# Patient Record
Sex: Male | Born: 1950 | Race: White | Hispanic: No | Marital: Married | Smoking: Never smoker
Health system: Southern US, Community
[De-identification: ages and names within clinical notes are randomized; demographics above are authoritative.]

## PROBLEM LIST (undated history)

## (undated) DIAGNOSIS — K7460 Unspecified cirrhosis of liver: Secondary | ICD-10-CM

## (undated) DIAGNOSIS — R251 Tremor, unspecified: Secondary | ICD-10-CM

## (undated) DIAGNOSIS — K219 Gastro-esophageal reflux disease without esophagitis: Secondary | ICD-10-CM

## (undated) DIAGNOSIS — I499 Cardiac arrhythmia, unspecified: Secondary | ICD-10-CM

## (undated) DIAGNOSIS — Z974 Presence of external hearing-aid: Secondary | ICD-10-CM

## (undated) DIAGNOSIS — H9190 Unspecified hearing loss, unspecified ear: Secondary | ICD-10-CM

## (undated) DIAGNOSIS — K760 Fatty (change of) liver, not elsewhere classified: Secondary | ICD-10-CM

## (undated) DIAGNOSIS — E119 Type 2 diabetes mellitus without complications: Secondary | ICD-10-CM

## (undated) DIAGNOSIS — H409 Unspecified glaucoma: Secondary | ICD-10-CM

## (undated) DIAGNOSIS — K5790 Diverticulosis of intestine, part unspecified, without perforation or abscess without bleeding: Secondary | ICD-10-CM

## (undated) DIAGNOSIS — G4730 Sleep apnea, unspecified: Secondary | ICD-10-CM

## (undated) DIAGNOSIS — R609 Edema, unspecified: Secondary | ICD-10-CM

## (undated) DIAGNOSIS — Z8639 Personal history of other endocrine, nutritional and metabolic disease: Secondary | ICD-10-CM

## (undated) DIAGNOSIS — J449 Chronic obstructive pulmonary disease, unspecified: Secondary | ICD-10-CM

## (undated) DIAGNOSIS — I10 Essential (primary) hypertension: Secondary | ICD-10-CM

## (undated) DIAGNOSIS — C801 Malignant (primary) neoplasm, unspecified: Secondary | ICD-10-CM

## (undated) DIAGNOSIS — R918 Other nonspecific abnormal finding of lung field: Secondary | ICD-10-CM

## (undated) DIAGNOSIS — I2699 Other pulmonary embolism without acute cor pulmonale: Secondary | ICD-10-CM

## (undated) DIAGNOSIS — F329 Major depressive disorder, single episode, unspecified: Secondary | ICD-10-CM

## (undated) DIAGNOSIS — F32A Depression, unspecified: Secondary | ICD-10-CM

## (undated) DIAGNOSIS — Z87442 Personal history of urinary calculi: Secondary | ICD-10-CM

## (undated) DIAGNOSIS — R011 Cardiac murmur, unspecified: Secondary | ICD-10-CM

## (undated) DIAGNOSIS — M109 Gout, unspecified: Secondary | ICD-10-CM

## (undated) DIAGNOSIS — I4891 Unspecified atrial fibrillation: Secondary | ICD-10-CM

## (undated) DIAGNOSIS — IMO0001 Reserved for inherently not codable concepts without codable children: Secondary | ICD-10-CM

## (undated) DIAGNOSIS — J189 Pneumonia, unspecified organism: Secondary | ICD-10-CM

## (undated) DIAGNOSIS — L719 Rosacea, unspecified: Secondary | ICD-10-CM

## (undated) DIAGNOSIS — C4491 Basal cell carcinoma of skin, unspecified: Secondary | ICD-10-CM

## (undated) HISTORY — DX: Essential (primary) hypertension: I10

## (undated) HISTORY — PX: OTHER SURGICAL HISTORY: SHX169

## (undated) HISTORY — PX: CARDIAC ELECTROPHYSIOLOGY MAPPING AND ABLATION: SHX1292

## (undated) HISTORY — PX: JOINT REPLACEMENT: SHX530

## (undated) HISTORY — DX: Reserved for inherently not codable concepts without codable children: IMO0001

## (undated) HISTORY — PX: LIVER BIOPSY: SHX301

## (undated) HISTORY — DX: Depression, unspecified: F32.A

## (undated) HISTORY — PX: TOE AMPUTATION: SHX809

## (undated) HISTORY — DX: Major depressive disorder, single episode, unspecified: F32.9

## (undated) HISTORY — PX: BRONCHOSCOPY: SUR163

## (undated) HISTORY — PX: ANTERIOR CRUCIATE LIGAMENT (ACL) REVISION: SHX6707

## (undated) HISTORY — PX: MENISCUS REPAIR: SHX5179

## (undated) HISTORY — PX: COLONOSCOPY: SHX174

## (undated) HISTORY — PX: KIDNEY STONE SURGERY: SHX686

## (undated) HISTORY — DX: Gastro-esophageal reflux disease without esophagitis: K21.9

## (undated) HISTORY — PX: CHOLECYSTECTOMY: SHX55

## (undated) HISTORY — PX: BUNIONECTOMY: SHX129

## (undated) HISTORY — PX: EYE SURGERY: SHX253

---

## 2003-10-13 ENCOUNTER — Emergency Department (HOSPITAL_COMMUNITY): Admission: AD | Admit: 2003-10-13 | Discharge: 2003-10-13 | Payer: Self-pay | Admitting: Family Medicine

## 2003-10-23 ENCOUNTER — Ambulatory Visit (HOSPITAL_BASED_OUTPATIENT_CLINIC_OR_DEPARTMENT_OTHER): Admission: RE | Admit: 2003-10-23 | Discharge: 2003-10-23 | Payer: Self-pay | Admitting: *Deleted

## 2005-03-07 ENCOUNTER — Ambulatory Visit (HOSPITAL_COMMUNITY): Admission: RE | Admit: 2005-03-07 | Discharge: 2005-03-07 | Payer: Self-pay | Admitting: Internal Medicine

## 2007-08-11 ENCOUNTER — Ambulatory Visit: Payer: Self-pay | Admitting: Gastroenterology

## 2007-08-14 ENCOUNTER — Observation Stay: Payer: Self-pay | Admitting: Gastroenterology

## 2008-08-09 ENCOUNTER — Ambulatory Visit: Payer: Self-pay | Admitting: Internal Medicine

## 2009-03-30 ENCOUNTER — Ambulatory Visit: Payer: Self-pay | Admitting: Unknown Physician Specialty

## 2009-04-17 ENCOUNTER — Ambulatory Visit: Payer: Self-pay | Admitting: Unknown Physician Specialty

## 2010-10-17 ENCOUNTER — Ambulatory Visit: Payer: Self-pay | Admitting: Gastroenterology

## 2012-09-17 ENCOUNTER — Ambulatory Visit: Payer: Self-pay | Admitting: General Practice

## 2012-10-07 ENCOUNTER — Ambulatory Visit: Payer: Self-pay | Admitting: General Practice

## 2012-10-15 ENCOUNTER — Ambulatory Visit: Payer: Self-pay | Admitting: General Practice

## 2012-10-29 ENCOUNTER — Ambulatory Visit: Payer: Self-pay | Admitting: General Practice

## 2013-10-16 LAB — COMPREHENSIVE METABOLIC PANEL
ALBUMIN: 4.1 g/dL (ref 3.4–5.0)
AST: 72 U/L — AB (ref 15–37)
Alkaline Phosphatase: 120 U/L — ABNORMAL HIGH
Anion Gap: 4 — ABNORMAL LOW (ref 7–16)
BILIRUBIN TOTAL: 0.7 mg/dL (ref 0.2–1.0)
BUN: 25 mg/dL — ABNORMAL HIGH (ref 7–18)
CHLORIDE: 103 mmol/L (ref 98–107)
CO2: 29 mmol/L (ref 21–32)
Calcium, Total: 9.2 mg/dL (ref 8.5–10.1)
Creatinine: 1.19 mg/dL (ref 0.60–1.30)
EGFR (African American): >60
GLUCOSE: 133 mg/dL — AB (ref 65–99)
OSMOLALITY: 278 (ref 275–301)
Potassium: 4.3 mmol/L (ref 3.5–5.1)
SGPT (ALT): 94 U/L — ABNORMAL HIGH (ref 12–78)
Sodium: 136 mmol/L (ref 136–145)
TOTAL PROTEIN: 7.8 g/dL (ref 6.4–8.2)

## 2013-10-16 LAB — CBC WITH DIFFERENTIAL/PLATELET
Basophil #: 0.1 10*3/uL (ref 0.0–0.1)
Basophil %: 1.2 %
EOS ABS: 0.2 10*3/uL (ref 0.0–0.7)
EOS PCT: 1.8 %
HCT: 50.3 % (ref 40.0–52.0)
HGB: 16.8 g/dL (ref 13.0–18.0)
LYMPHS ABS: 2.8 10*3/uL (ref 1.0–3.6)
LYMPHS PCT: 31.3 %
MCH: 32.3 pg (ref 26.0–34.0)
MCHC: 33.5 g/dL (ref 32.0–36.0)
MCV: 97 fL (ref 80–100)
MONO ABS: 0.9 x10 3/mm (ref 0.2–1.0)
MONOS PCT: 10.4 %
NEUTROS ABS: 4.9 10*3/uL (ref 1.4–6.5)
Neutrophil %: 55.3 %
PLATELETS: 187 10*3/uL (ref 150–440)
RBC: 5.2 10*6/uL (ref 4.40–5.90)
RDW: 13.4 % (ref 11.5–14.5)
WBC: 8.8 10*3/uL (ref 3.8–10.6)

## 2013-10-16 LAB — LIPASE, BLOOD: Lipase: 133 U/L (ref 73–393)

## 2013-10-17 ENCOUNTER — Observation Stay: Payer: Self-pay | Admitting: Internal Medicine

## 2013-10-17 LAB — URINALYSIS, COMPLETE
Bacteria: NONE SEEN
Bilirubin,UR: NEGATIVE
GLUCOSE, UR: NEGATIVE mg/dL (ref 0–75)
Ketone: NEGATIVE
LEUKOCYTE ESTERASE: NEGATIVE
Nitrite: NEGATIVE
PROTEIN: NEGATIVE
Ph: 5 (ref 4.5–8.0)
SPECIFIC GRAVITY: 1.033 (ref 1.003–1.030)
Squamous Epithelial: <1

## 2013-10-18 LAB — BASIC METABOLIC PANEL
ANION GAP: 4 — AB (ref 7–16)
BUN: 19 mg/dL — ABNORMAL HIGH (ref 7–18)
CHLORIDE: 101 mmol/L (ref 98–107)
Calcium, Total: 8.3 mg/dL — ABNORMAL LOW (ref 8.5–10.1)
Co2: 30 mmol/L (ref 21–32)
Creatinine: 1.02 mg/dL (ref 0.60–1.30)
EGFR (Non-African Amer.): >60
Glucose: 77 mg/dL (ref 65–99)
Osmolality: 271 (ref 275–301)
Potassium: 3.7 mmol/L (ref 3.5–5.1)
SODIUM: 135 mmol/L — AB (ref 136–145)

## 2013-10-18 LAB — CBC WITH DIFFERENTIAL/PLATELET
BASOS ABS: 0 10*3/uL (ref 0.0–0.1)
Basophil %: 0.9 %
Eosinophil #: 0.1 10*3/uL (ref 0.0–0.7)
Eosinophil %: 2.3 %
HCT: 45.1 % (ref 40.0–52.0)
HGB: 15.2 g/dL (ref 13.0–18.0)
Lymphocyte #: 2.4 10*3/uL (ref 1.0–3.6)
Lymphocyte %: 43.6 %
MCH: 32.9 pg (ref 26.0–34.0)
MCHC: 33.7 g/dL (ref 32.0–36.0)
MCV: 98 fL (ref 80–100)
Monocyte #: 0.6 x10 3/mm (ref 0.2–1.0)
Monocyte %: 10.7 %
Neutrophil #: 2.4 10*3/uL (ref 1.4–6.5)
Neutrophil %: 42.5 %
PLATELETS: 132 10*3/uL — AB (ref 150–440)
RBC: 4.63 10*6/uL (ref 4.40–5.90)
RDW: 13.6 % (ref 11.5–14.5)
WBC: 5.6 10*3/uL (ref 3.8–10.6)

## 2013-11-23 DIAGNOSIS — N1330 Unspecified hydronephrosis: Secondary | ICD-10-CM | POA: Insufficient documentation

## 2013-11-23 DIAGNOSIS — N201 Calculus of ureter: Secondary | ICD-10-CM | POA: Insufficient documentation

## 2013-11-23 DIAGNOSIS — N23 Unspecified renal colic: Secondary | ICD-10-CM | POA: Insufficient documentation

## 2013-11-23 DIAGNOSIS — N200 Calculus of kidney: Secondary | ICD-10-CM | POA: Insufficient documentation

## 2013-12-04 ENCOUNTER — Emergency Department: Payer: Self-pay | Admitting: Emergency Medicine

## 2013-12-04 LAB — CBC
HCT: 49 % (ref 40.0–52.0)
HGB: 16.4 g/dL (ref 13.0–18.0)
MCH: 31.9 pg (ref 26.0–34.0)
MCHC: 33.5 g/dL (ref 32.0–36.0)
MCV: 95 fL (ref 80–100)
Platelet: 158 10*3/uL (ref 150–440)
RBC: 5.15 10*6/uL (ref 4.40–5.90)
RDW: 13.6 % (ref 11.5–14.5)
WBC: 9.2 10*3/uL (ref 3.8–10.6)

## 2013-12-04 LAB — COMPREHENSIVE METABOLIC PANEL
ALK PHOS: 87 U/L
Albumin: 3.7 g/dL (ref 3.4–5.0)
Anion Gap: 9 (ref 7–16)
BILIRUBIN TOTAL: 2 mg/dL — AB (ref 0.2–1.0)
BUN: 17 mg/dL (ref 7–18)
CALCIUM: 9.1 mg/dL (ref 8.5–10.1)
CO2: 25 mmol/L (ref 21–32)
Chloride: 98 mmol/L (ref 98–107)
Creatinine: 0.84 mg/dL (ref 0.60–1.30)
GLUCOSE: 115 mg/dL — AB (ref 65–99)
Osmolality: 267 (ref 275–301)
POTASSIUM: 3.8 mmol/L (ref 3.5–5.1)
SGOT(AST): 56 U/L — ABNORMAL HIGH (ref 15–37)
SGPT (ALT): 78 U/L (ref 12–78)
Sodium: 132 mmol/L — ABNORMAL LOW (ref 136–145)
Total Protein: 7.9 g/dL (ref 6.4–8.2)

## 2014-01-02 DIAGNOSIS — F329 Major depressive disorder, single episode, unspecified: Secondary | ICD-10-CM | POA: Insufficient documentation

## 2014-01-02 DIAGNOSIS — F32A Depression, unspecified: Secondary | ICD-10-CM | POA: Insufficient documentation

## 2014-01-02 DIAGNOSIS — I10 Essential (primary) hypertension: Secondary | ICD-10-CM | POA: Insufficient documentation

## 2014-01-02 DIAGNOSIS — M109 Gout, unspecified: Secondary | ICD-10-CM | POA: Insufficient documentation

## 2014-01-02 DIAGNOSIS — K219 Gastro-esophageal reflux disease without esophagitis: Secondary | ICD-10-CM | POA: Insufficient documentation

## 2014-01-02 DIAGNOSIS — G4733 Obstructive sleep apnea (adult) (pediatric): Secondary | ICD-10-CM | POA: Insufficient documentation

## 2014-01-02 DIAGNOSIS — J449 Chronic obstructive pulmonary disease, unspecified: Secondary | ICD-10-CM | POA: Insufficient documentation

## 2014-01-02 DIAGNOSIS — R259 Unspecified abnormal involuntary movements: Secondary | ICD-10-CM | POA: Insufficient documentation

## 2014-01-02 DIAGNOSIS — L719 Rosacea, unspecified: Secondary | ICD-10-CM | POA: Insufficient documentation

## 2014-01-02 DIAGNOSIS — R251 Tremor, unspecified: Secondary | ICD-10-CM | POA: Insufficient documentation

## 2014-05-08 ENCOUNTER — Ambulatory Visit (INDEPENDENT_AMBULATORY_CARE_PROVIDER_SITE_OTHER): Payer: Federal, State, Local not specified - PPO | Admitting: Podiatry

## 2014-05-08 ENCOUNTER — Encounter: Payer: Self-pay | Admitting: Podiatry

## 2014-05-08 ENCOUNTER — Ambulatory Visit (INDEPENDENT_AMBULATORY_CARE_PROVIDER_SITE_OTHER): Payer: Federal, State, Local not specified - PPO

## 2014-05-08 VITALS — BP 104/60 | HR 64 | Resp 16 | Ht 76.0 in | Wt 240.0 lb

## 2014-05-08 DIAGNOSIS — M2040 Other hammer toe(s) (acquired), unspecified foot: Secondary | ICD-10-CM

## 2014-05-08 NOTE — Progress Notes (Signed)
He presents today to discuss surgical intervention regarding his left foot. 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 He denies fever chills nausea vomiting no changes in his past medical history medications allergies surgeries social history.  Objective: Pulses are strongly palpable bilateral. He has severe hammertoe deformity #2 of the left foot which is severely rigid and malformed. An elongated second metatarsal on radiograph also demonstrates plantar flexed second metatarsal. 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 He has pain on palpation both of these toes with no signs of infection.  Assessment: Severe hammertoe deformity second metatarsophalangeal joint and second toe left foot. 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  Plan: Discussed etiology pathology conservative versus surgical therapies. I did suggest to him that we could try to decompress the metatarsophalangeal joint and to lower the toe and pin the toe at this point he is more in favor of simple disarticulation of the second metatarsophalangeal joint. A mallet toe repair third digit left foot. We will followup with him in January for surgical consideration.

## 2014-06-06 DIAGNOSIS — E785 Hyperlipidemia, unspecified: Secondary | ICD-10-CM | POA: Insufficient documentation

## 2014-06-07 DIAGNOSIS — L84 Corns and callosities: Secondary | ICD-10-CM

## 2014-06-07 DIAGNOSIS — N529 Male erectile dysfunction, unspecified: Secondary | ICD-10-CM | POA: Insufficient documentation

## 2014-07-31 ENCOUNTER — Ambulatory Visit: Payer: Federal, State, Local not specified - PPO | Admitting: Podiatry

## 2014-08-03 ENCOUNTER — Ambulatory Visit: Payer: Federal, State, Local not specified - PPO | Admitting: Podiatry

## 2014-08-07 ENCOUNTER — Ambulatory Visit (INDEPENDENT_AMBULATORY_CARE_PROVIDER_SITE_OTHER): Payer: Federal, State, Local not specified - PPO | Admitting: Podiatry

## 2014-08-07 VITALS — BP 96/54 | HR 86 | Resp 16

## 2014-08-07 DIAGNOSIS — M2040 Other hammer toe(s) (acquired), unspecified foot: Secondary | ICD-10-CM

## 2014-08-07 NOTE — Progress Notes (Signed)
He presents today for surgical consult with his wife regarding his painful hammertoes second digit bilateral. He also has a painful arthritic third toe left foot.  Objective: Vital signs are stable he is alert and oriented 3 no changes from previous exam which demonstrates severe multiplanar deformity of the second metatarsal phalangeal joint and second toe bilaterally. He also has severe osteoarthritic changes to the DIPJ of the third digit of the left foot. Complete dislocation of the second metatarsophalangeal joint left foot with severe contracture deformity left over right. Radiographs were reviewed today confirming severe digital deformities.  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  Plan: We discussed the pros and cons of decompression of the second metatarsal head and fusion of the PIPJ and DIPJ of the second toes bilaterally. After discussing this quite thoroughly with both he and his wife he requests that the toe simply be disarticulated and removed. He would like to third toe of the left foot surgically repaired at the DIPJ.  We went over a consent form today line by line number by number giving him ample time to ask questions both he and his wife saw fit regarding a amputation of the second toe at the metatarsophalangeal joint bilaterally. I answered all the questions regarding these procedures to the best of my abilities. An arthroplasty of the DIPJ will be performed to the third toe left foot. We discussed the possible postop complications the benefits and the risks of this type of surgery he understands and is amenable to it and I will follow-up with him in a few weeks for surgery.

## 2014-08-07 NOTE — Patient Instructions (Signed)
Pre-Operative Instructions  Congratulations, you have decided to take an important step to improving your quality of life.  You can be assured that the doctors of [facility] will be with you every step of the way.  1. Plan to be at the surgery center/hospital at least 1 (one) hour prior to your scheduled time unless otherwise directed by the surgical center/hospital staff.  You must have a responsible adult accompany you, remain during the surgery and drive you home.  Make sure you have directions to the surgical center/hospital and know how to get there on time. 2. For hospital based surgery you will need to obtain a history and physical form from your family physician within 1 month prior to the date of surgery- we will give you a form for you primary physician.  3. We make every effort to accommodate the date you request for surgery.  There are however, times where surgery dates or times have to be moved.  We will contact you as soon as possible if a change in schedule is required.   4. No Aspirin/Ibuprofen for one week before surgery.  If you are on aspirin, any non-steroidal anti-inflammatory medications (Mobic, Aleve, Ibuprofen) you should stop taking it 7 days prior to your surgery.  You make take Tylenol  For pain prior to surgery.  5. Medications- If you are taking daily heart and blood pressure medications, seizure, reflux, allergy, asthma, anxiety, pain or diabetes medications, make sure the surgery center/hospital is aware before the day of surgery so they may notify you which medications to take or avoid the day of surgery. 6. No food or drink after midnight the night before surgery unless directed otherwise by surgical center/hospital staff. 7. No alcoholic beverages 24 hours prior to surgery.  No smoking 24 hours prior to or 24 hours after surgery. 8. Wear loose pants or shorts- loose enough to fit over bandages, boots, and casts. 9. No slip on shoes, sneakers are best. 10. Bring  your boot with you to the surgery center/hospital.  Also bring crutches or a walker if your physician has prescribed it for you.  If you do not have this equipment, it will be provided for you after surgery. 11. If you have not been contracted by the surgery center/hospital by the day before your surgery, call to confirm the date and time of your surgery. 12. Leave-time from work may vary depending on the type of surgery you have.  Appropriate arrangements should be made prior to surgery with your employer. 13. Prescriptions will be provided immediately following surgery by your doctor.  Have these filled as soon as possible after surgery and take the medication as directed. 14. Remove nail polish on the operative foot. 15. Wash the night before surgery.  The night before surgery wash the foot and leg well with the antibacterial soap provided and water paying special attention to beneath the toenails and in between the toes.  Rinse thoroughly with water and dry well with a towel.  Perform this wash unless told not to do so by your physician.  Enclosed: 1 Ice pack (please put in freezer the night before surgery)   1 Hibiclens skin cleaner   Pre-op Instructions  If you have any questions regarding the instructions, do not hesitate to call our office.  [place]: [address] [place], [place] [address] [phone]  [place]: [address] [place], [place] [address] [phone]  [place]: [address]  ,  [address] [phone]  Dr. Richard   Tuchman DPM, Dr. Norman Regal DPM Dr. Richard Sikora DPM, Dr. M. Todd Hyatt DPM, Dr. Kathryn Egerton DPM 

## 2014-08-08 ENCOUNTER — Telehealth: Payer: Self-pay | Admitting: *Deleted

## 2014-08-08 NOTE — Telephone Encounter (Signed)
CPAP is usually not necessary after surgery.

## 2014-08-08 NOTE — Telephone Encounter (Signed)
Pt states he saw Dr. Milinda Pointer yesterday, and is scheduled for B/L foot surgery 1 week from Friday.  Pt states he would like to inform Dr. Milinda Pointer, that he uses a CPAP at night, and has had several surgeries since receiving the CPAP without any problems.  Pt states he will inform the surgical center when they call.

## 2014-08-11 ENCOUNTER — Telehealth: Payer: Self-pay | Admitting: *Deleted

## 2014-08-11 NOTE — Telephone Encounter (Signed)
Pt asked if he should stay on the Aspirin 81mg .  I told him to continue the except the day of and resume the day after, he may have a little more bleeding than someone not on Aspirin.

## 2014-08-18 ENCOUNTER — Encounter: Payer: Self-pay | Admitting: Podiatry

## 2014-08-18 DIAGNOSIS — M779 Enthesopathy, unspecified: Secondary | ICD-10-CM

## 2014-08-18 DIAGNOSIS — M2040 Other hammer toe(s) (acquired), unspecified foot: Secondary | ICD-10-CM

## 2014-08-18 DIAGNOSIS — M13879 Other specified arthritis, unspecified ankle and foot: Secondary | ICD-10-CM

## 2014-08-21 ENCOUNTER — Telehealth: Payer: Self-pay

## 2014-08-21 NOTE — Telephone Encounter (Signed)
Pt called stating that he had surgery on his feet Friday and has concerns regarding the antibiotic (Keflex) prescribed, states he has past liver issues and was concerned about taking the medication

## 2014-08-21 NOTE — Telephone Encounter (Signed)
Spoke to [place] told him not to be concerned about taking the antibiotic that he will be fine with that, he also stated that he was itching an hour after taking medication, told him that was probably the pain medication not the antibiotic. He stated that he is stopping the pain medication , that he is not experiencing a lot of pain that he would be fine without it. Told him to call us back with any problems

## 2014-08-23 ENCOUNTER — Ambulatory Visit (INDEPENDENT_AMBULATORY_CARE_PROVIDER_SITE_OTHER): Payer: Federal, State, Local not specified - PPO

## 2014-08-23 ENCOUNTER — Ambulatory Visit (INDEPENDENT_AMBULATORY_CARE_PROVIDER_SITE_OTHER): Payer: Federal, State, Local not specified - PPO | Admitting: Podiatry

## 2014-08-23 VITALS — BP 91/55 | HR 58 | Resp 99

## 2014-08-23 DIAGNOSIS — M2041 Other hammer toe(s) (acquired), right foot: Secondary | ICD-10-CM

## 2014-08-23 DIAGNOSIS — Z9889 Other specified postprocedural states: Secondary | ICD-10-CM

## 2014-08-23 DIAGNOSIS — M2042 Other hammer toe(s) (acquired), left foot: Secondary | ICD-10-CM

## 2014-08-23 NOTE — Progress Notes (Signed)
He presents today for follow-up 5 days postop amputation disarticulation second digits bilateral at the metatarsophalangeal joint and a DIPJ arthroplasty third digit left foot. He denies fever chills nausea vomiting muscle aches and pains.  Objective: Vital signs are stable he is alert and oriented 3. Dry sterile dressing was intact was removed demonstrates surgical sites appear to be doing very well mild erythema but appears to be induration and bruising more than infection left foot. Mild drainage from the suture sites third toe left foot.  Assessment: Well-healing surgical foot status post amputation second metatarsal phalangeal joints and DIPJ arthroplasty third left.  Plan: Redressed today dressed compressive dressing follow-up with him and we can a half for suture removal.

## 2014-08-24 NOTE — Progress Notes (Signed)
DOS 08/18/2014 B/L 2nd toe amputation, left 3rd DIPJ arthroplasty performed by DR. Hyatt.

## 2014-09-04 ENCOUNTER — Ambulatory Visit (INDEPENDENT_AMBULATORY_CARE_PROVIDER_SITE_OTHER): Payer: Federal, State, Local not specified - PPO | Admitting: Podiatry

## 2014-09-04 ENCOUNTER — Ambulatory Visit (INDEPENDENT_AMBULATORY_CARE_PROVIDER_SITE_OTHER): Payer: Federal, State, Local not specified - PPO

## 2014-09-04 ENCOUNTER — Encounter: Payer: Self-pay | Admitting: Podiatry

## 2014-09-04 VITALS — BP 128/82 | HR 64 | Resp 12

## 2014-09-04 DIAGNOSIS — Z9889 Other specified postprocedural states: Secondary | ICD-10-CM

## 2014-09-04 DIAGNOSIS — M2040 Other hammer toe(s) (acquired), unspecified foot: Secondary | ICD-10-CM

## 2014-09-04 NOTE — Patient Instructions (Signed)

## 2014-09-04 NOTE — Progress Notes (Signed)
Karma presents today 3 weeks status post amputation second digit bilateral and arthroplasty third digit left foot. He denies fever chills nausea vomiting muscle aches and pains.  Objective: Vital signs are stable he is alert and oriented 3. Pulses are palpable sutures are intact margins are well coapted with the exception of the distalmost margin of the left foot at the amputation site. I removed the sutures from the third toe as well as the proximal and distal knots on each foot at the amputation site. I see no signs of infection but I would like to have this to heal completely before removing these sutures.  Assessment: Well-healing surgical foot bilateral.  Plan: Removed a portion of this sutures today and instructed him on how to wrap the third digit of the left foot. I'm going to allow him to start soaking this twice daily in Epsom salts and warm water wrap the toe daily and I will follow-up with him in 1 week.

## 2014-09-11 ENCOUNTER — Encounter: Payer: Federal, State, Local not specified - PPO | Admitting: Podiatry

## 2014-09-12 ENCOUNTER — Encounter: Payer: Federal, State, Local not specified - PPO | Admitting: Podiatry

## 2014-09-12 ENCOUNTER — Ambulatory Visit (INDEPENDENT_AMBULATORY_CARE_PROVIDER_SITE_OTHER): Payer: Federal, State, Local not specified - PPO | Admitting: Podiatry

## 2014-09-12 VITALS — BP 110/63 | HR 75 | Resp 16

## 2014-09-12 DIAGNOSIS — Z9889 Other specified postprocedural states: Secondary | ICD-10-CM

## 2014-09-12 MED ORDER — SULFAMETHOXAZOLE-TRIMETHOPRIM 800-160 MG PO TABS: ORAL_TABLET | ORAL | Status: DC

## 2014-09-12 NOTE — Progress Notes (Signed)
He presents today for suture removal status post amputation disarticulation second digit bilateral. He also had a DIPJ arthroplasty third digit left foot. He states that the left foot does not appear to be healing as well at the amputation site.  Objective: Vital signs are stable he is alert and oriented 3 he has a mild dehiscence at the surgical site. The sutures were removed today and there appeared to be a small abscess noted. I cleaned up all of the necrotic tissue and debrided the area nicely today to bleeding. Placed a Silvadene dressing dry sterile compressive dressing. He will start soaking in Epsom salts and warm water. I wrote a prescription for Bactrim.  Assessment: Well-healing surgical foot right mild dehiscence of surgical site left foot. Hammertoe is healing well left third.  Plan: Start him on an antibiotic today Bactrim DS for the dehiscence and the mild erythema to the dorsal aspect of his left foot. He will continue to soak and apply a dressing daily and I will follow-up with him in 2 weeks. His right foot he will get back into a regular tennis shoe.

## 2014-09-13 ENCOUNTER — Encounter: Payer: Self-pay | Admitting: Podiatry

## 2014-09-27 ENCOUNTER — Ambulatory Visit (INDEPENDENT_AMBULATORY_CARE_PROVIDER_SITE_OTHER): Payer: Federal, State, Local not specified - PPO | Admitting: Podiatry

## 2014-09-27 ENCOUNTER — Encounter: Payer: Self-pay | Admitting: Podiatry

## 2014-09-27 ENCOUNTER — Ambulatory Visit (INDEPENDENT_AMBULATORY_CARE_PROVIDER_SITE_OTHER): Payer: Federal, State, Local not specified - PPO

## 2014-09-27 DIAGNOSIS — M2040 Other hammer toe(s) (acquired), unspecified foot: Secondary | ICD-10-CM

## 2014-09-27 MED ORDER — SULFAMETHOXAZOLE-TRIMETHOPRIM 800-160 MG PO TABS: ORAL_TABLET | ORAL | Status: DC

## 2014-09-27 NOTE — Progress Notes (Signed)
He presents today status post amputation disarticulation second toes bilateral. Superficial cellulitis to the left foot has gone on to resolve after antibiotic therapy. However there are 2 very superficial wounds at the surgical sites. He denies fever chills nausea vomiting muscle aches and pains states that the wounds are getting better.  Objective: Vital signs are stable he is alert and oriented 3. Pulses are strongly palpable bilateral. There is minimal edema and no erythema cellulitis drainage or odor.  Assessment: Well-healing surgical sites bilateral.  Plan: Continue his antibiotic until the wounds are completely closed. Continue to soak warm water and Epsom salts. he may wear his regular shoe gear.

## 2014-10-11 ENCOUNTER — Ambulatory Visit (INDEPENDENT_AMBULATORY_CARE_PROVIDER_SITE_OTHER): Payer: Federal, State, Local not specified - PPO

## 2014-10-11 ENCOUNTER — Ambulatory Visit (INDEPENDENT_AMBULATORY_CARE_PROVIDER_SITE_OTHER): Payer: Federal, State, Local not specified - PPO | Admitting: Podiatry

## 2014-10-11 VITALS — BP 97/67 | HR 66 | Resp 16

## 2014-10-11 DIAGNOSIS — Z9889 Other specified postprocedural states: Secondary | ICD-10-CM

## 2014-10-11 DIAGNOSIS — S90122A Contusion of left lesser toe(s) without damage to nail, initial encounter: Secondary | ICD-10-CM

## 2014-10-11 NOTE — Progress Notes (Signed)
He presents today for follow-up of slow healing wounds status post amputation second metatarsal phalangeal joint bilateral he also states that he injured his toes while walking through the house in the bedroom at night. He states that he kicked his left foot or with performing arthroplasty on the DIPJ third digit left.  Objective: Vital signs are stable he is alert and oriented 3. Pulses are strongly palpable. Wounds appear to be 90% resolved no drainage no signs of infection. The third digit of the left foot does demonstrate edema and tenderness on palpation. Radiograph of the left foot does demonstrate a complete dislocation of the DIPJ from where it was immediately postoperatively. This is most likely secondary to his recent trauma.  Assessment: Dislocation DIPJ third digit left. Slowly healing wounds associated with diabetes status post amputation toes #2 at the second metatarsophalangeal joint bilateral. Follow up with him in 1 month. Continue to wrap the toe on a daily basis.

## 2014-11-08 ENCOUNTER — Ambulatory Visit (INDEPENDENT_AMBULATORY_CARE_PROVIDER_SITE_OTHER): Payer: Federal, State, Local not specified - PPO | Admitting: Podiatry

## 2014-11-08 ENCOUNTER — Encounter: Payer: Self-pay | Admitting: Podiatry

## 2014-11-08 ENCOUNTER — Ambulatory Visit: Payer: Self-pay

## 2014-11-08 VITALS — BP 96/54 | HR 70 | Resp 16

## 2014-11-08 DIAGNOSIS — Z9889 Other specified postprocedural states: Secondary | ICD-10-CM

## 2014-11-08 NOTE — Progress Notes (Signed)
He presents today for his final follow-up visit status post disarticulation second toes bilateral foot. He appears to be doing quite well he states that he is back to walking 2-1/2 miles a day.  Objective: Vital signs are stable he is alert and oriented 3 incision sites gone on to heal 100%. He has no pain on palpation of the surgical site.  Assessment: Well-healing surgical foot bilateral.  Plan: Continue to wrap third toe for the next couple to 3 weeks and follow up with me as needed.

## 2014-11-17 NOTE — Op Note (Signed)
PATIENT NAME:  Billy Freeman, Billy Freeman MR#:  [id number] DATE OF BIRTH:  1950-12-25  DATE OF PROCEDURE:  10/15/2012  PREOPERATIVE DIAGNOSIS: Internal derangement of the right knee.   POSTOPERATIVE DIAGNOSES:  1. Tear of the posterior horn of medial meniscus, right knee.  2. Tear of the anterior and posterior horns of the lateral meniscus, right knee.  3. 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   PROCEDURES PERFORMED: Right knee arthroscopy, partial medial and lateral meniscectomies and tricompartmental chondroplasty.   SURGEON: Laurice Record. Holley Bouche., MD  ANESTHESIA: General.   ESTIMATED BLOOD LOSS: Minimal.   FLUIDS REPLACED: 600 mL of crystalloid.   DRAINS: None.   INDICATIONS FOR SURGERY: The patient is a 64 year old gentleman who has been seen for complaints of right knee pain. MRI demonstrated findings consistent with meniscal pathology as well as some degenerative change. After discussion of the risks and benefits of surgical intervention, the patient expressed understanding of the risks and benefits and agreed with plans for surgical intervention.   PROCEDURE IN DETAIL: The patient was brought in the operating room, and after adequate general anesthesia was achieved, a tourniquet was placed on the patient's upper right thigh. The patient's right knee and leg were cleaned and prepped with alcohol and DuraPrep and draped in the usual sterile fashion. A "timeout" was performed as per usual protocol. The anticipated portal sites were injected with 0.25% Marcaine with epinephrine. An anterolateral portal was created and cannula was inserted. A small effusion was evacuated. The scope was inserted, and the knee was distended with fluid using the Stryker pump. The scope was advanced down the medial gutter into the medial compartment of the knee. Under visualization with the scope, an anteromedial portal  was created, and a hook probe was inserted. Inspection of the medial compartment demonstrated degenerative tear of the posterior horn of the medial meniscus. This was debrided using a combination of 4.5 mm incisor shaver, with subsequent debridement and contouring performed using the 50 degree ArthroCare wand. The remaining rim of the meniscus was visualized and probed and felt to stable. Anterior horn was visualized and probed and felt to be stable. Mild chondral changes were noted to the medial femoral condyle. There was an area of grade 4 chondromalacia involving the posterior and medial aspect of the medial tibial plateau. The margins were debrided and contoured using the 50 degree ArthroCare wand. The scope was then advanced into the intercondylar region. The anterior cruciate ligament was visualized and probed and felt to be stable.   The scope was removed from the anterolateral portal and reinserted via the anteromedial portal so as to better visualize the lateral compartment. The articular surface to the lateral femoral condyle was in reasonably good condition. Grade 3 changes of chondromalacia to the lateral tibial plateau. Debridement was performed using the 50 degree ArthroCare wand. There was a complex degenerative tear involving both the anterior and posterior horns of the lateral meniscus. The areas of the tears were debrided using a 4.5 mm shaver and subsequently a 50 degree ArthroCare wand. The remaining rim of the meniscus was visualized and probed and felt to be stable. Finally, the scope was positioned so as to visualize the patellofemoral articulation. Good patellar tracking was noted. Some mild grade 2 to 3 changes of chondromalacia were noted to the medial facet, and these were debrided and contoured using the ArthroCare wand.   The knee was irrigated with copious  amounts of fluid and then suctioned dry. The anterolateral portal was reapproximated using #3-0 nylon. A combination of 0.25%  Marcaine with epinephrine and 4 mg of morphine was injected via the scope. The scope was removed, and the anteromedial portal was reapproximated using #3-0 nylon. A sterile dressing was applied, followed by application of ice wrap.   The patient tolerated the procedure well. He was transported to the recovery room in stable condition.   ____________________________ Laurice Record. Holley Bouche., MD jph:OSi D: 10/15/2012 19:43:11 ET T: 10/16/2012 11:47:21 ET JOB#: [id number]  cc: Jeneen Rinks P. Holley Bouche., MD, <Dictator> JAMES P Holley Bouche MD ELECTRONICALLY SIGNED 10/16/2012 20:31

## 2014-11-18 NOTE — Consult Note (Signed)
Stone removed with minimal ureteral edema.does well, can be discharged early afternoon.up 1-2 weeks after return from vactation Rx for pain meds and antibiotic on chart.  Electronic Signatures: Murrell Redden (MD) (Signed on 24-Mar-15 10:50)  Authored   Last Updated: 24-Mar-15 10:55 by Murrell Redden (MD)

## 2014-11-18 NOTE — H&P (Signed)
PATIENT NAME:  Billy Freeman, Billy Freeman MR#:  [id number] DATE OF BIRTH:  06/08/1951  DATE OF ADMISSION:  10/17/2013  REFERRING PHYSICIAN: Dr. Lurline Hare.   PRIMARY CARE PHYSICIAN: Dr. Geralyn Corwin.     CHIEF COMPLAINT:  Left renal colic.   HISTORY OF PRESENT ILLNESS: This is a 64 year old male with known past medical history of hypertension, sleep apnea on CPAP, hyperlipidemia, GERD, depression diet-controlled diabetes mellitus. The patient presents with complaints of left flank pain and abdominal pain.  The patient reported his symptoms had presented a few hours prior to presentation. He denies any history of renal colic or kidney stones in the past. Reports it is sharp, stabbing pain radiating to the groin area, started in the left flank area. The patient required multiple pain medications during his stay in the ED, so hospitalist services were requested to admit the patient. The patient denies any fever or chills, was afebrile. In the ED, denies any vomiting, but had some episodes of nausea. No coffee-ground emesis. No bright red blood per rectum. No diarrhea. No sweating. No testicular pain. The patient reports no relieving or provoking factors. CT abdomen and pelvis did show evidence of moderate left hydronephrosis to the level of the ureterovesicular junction where a 10 x 4 mm calculus in seen. The CT abdomen and pelvis did show evidence of cirrhosis of the liver, as well as blood work did show mildly elevated LFTs.   PAST MEDICAL HISTORY: 1.  Hypertension.  2.  Sleep apnea on CPAP.  3.  Hyperlipidemia.  4.  GERD. 5.  Depression.  6.  Insomnia.  7.  History of basal cell carcinoma.  8.  Benign essential tremors.  9.  History of fatty liver.  10.  Gout.  11.  History of rosacea on erythromycin.    PAST SURGICAL HISTORY: 1.  Bilateral bunion surgeries.  2.  Status post multiple knee surgeries, arthroscopies.  3.  Liver biopsies in 2006 and 2010.  4.  History of skin cancer, excised on nose.  5.   Colonoscopy.  6.  Right knee meniscus surgery.   ALLERGIES: ANIMAL DANDRUFF.   HOME MEDICATIONS: 1.  Aspirin 81 mg daily.  2.  Propranolol 20 mg oral 2 times a day.  3.  Citalopram 20 mg oral daily.  4.  Hydrochlorothiazide/losartan 12.5/100 mg oral daily.  5.  Temazepam 15 mg at bedtime as needed.  6.  Erythromycin 250 mg oral daily in the morning. This is for rosacea.    7.  Fish oil 1200 mg oral 2 times a day.  8.  Bupropion 150 mg 2 times daily.   SOCIAL HISTORY: The patient lives with his wife. No smoking. No alcohol. No illicit drug use.   FAMILY HISTORY: No coronary artery disease at young age.   REVIEW OF SYSTEMS:  CONSTITUTIONAL:  The patient denies any fever, any chills, any fatigue, weakness, weight gain or weight loss.  EYES: Denies blurry vision, double vision or inflammation.  ENT: Denies tinnitus, ear pain, hearing loss, epistaxis.  RESPIRATORY: Denies cough, wheezing, hemoptysis.  CARDIOVASCULAR: Denies chest pain, edema, arrhythmia, palpitation, syncope.  GASTROINTESTINAL:  Reports nausea. Denies any vomiting, any diarrhea, any hematemesis, any melena any jaundice and rectal bleed. Reports some abdominal pain.  GENITOURINARY: Denies dysuria, hematuria. Reports left side renal colic.  ENDOCRINE: Denies polyuria, polydipsia, heat or cold intolerance.  HEMATOLOGY: Denies anemia, easy bruising or bleeding diathesis.  INTEGUMENT: Denies acne, rash or skin lesion.  MUSCULOSKELETAL: Denies any swelling, arthritis or cramps. He  reports history of gout.  NEUROLOGIC: Denies any history of CVA, TIA, headache, ataxia or vertigo.  PSYCHIATRIC: Denies any anxiety or schizophrenia.  Reports history of insomnia and depression.   PHYSICAL EXAMINATION: VITAL SIGNS: Temperature 98.3, pulse 69, respiratory rate 18, blood pressure 155/95, saturating 95% on room air.  GENERAL: Well-nourished male, looks comfortable in bed, in no apparent distress.  HEENT: Head atraumatic,  normocephalic. Pupils equal, reactive to light. Pink conjunctivae. Anicteric sclerae. Moist oral mucosa.  NECK: Supple. No thyromegaly. No JVD.  CHEST: Good air entry bilaterally. No wheezing, rales, rhonchi.  CARDIOVASCULAR: S1, S2 heard. No rubs, murmurs or gallops.  ABDOMEN: Soft, nontender, nondistended. Bowel sounds present.  EXTREMITIES: No edema. No clubbing. No cyanosis. Pedal and radial pulses +2 bilaterally.  PSYCHIATRIC: Appropriate affect. Awake, alert x 3. Intact judgment and insight.  NEUROLOGIC: Cranial nerves grossly intact. Motor 5/5. No focal deficits.  SKIN: Normal skin turgor. Warm and dry.  LYMPHATIC: No cervical lymphadenopathy could be appreciated.   PERTINENT LABORATORIES: Glucose 133, BUN 25, creatinine 1.19, sodium 136, potassium 4.3, chloride 103, CO2 of 29. ALT 94, AST 72, alk phos 120. White blood cell 8.8, hemoglobin 16.8, hematocrit 50.3, platelets 187. Urinalysis negative for leuk esterase and nitrites, showing 4 white blood cells, 81 red blood cells. Lactic acid 2.2.   IMAGING STUDIES: CT abdomen and pelvis with contrast showing moderate left hydronephrosis to the level of the ureterovesicular junction where a 10 x 4 mm calculus is seen. Mildly delayed left nephrogram concerning for early dysfunction.   ASSESSMENT AND PLAN: 1.  Left renal colic, nephrolithiasis with evidence of mild hydronephrosis on the CT scan.  The patient will be admitted for pain control. Will be kept on p.r.n. nausea and pain medicine. Will be started on IV fluids, 100 mL per hour. We will consult urology service if there is any need for intervention at this point. We will start him empirically on IV Rocephin.  2. Hypertension: Blood pressure is acceptable. Continue on home medication; hydrochlorothiazide and losartan.  3.  Obstructive sleep apnea. Continue with CPAP at home.  4.  Hyperlipidemia. Continue with fish oil.  5.  Gastroesophageal reflux disease.  Continue with PPI.  6.  History  of depression. Continue with home medications; Celexa and bupropion.  7.  Elevated LFTs. The patient is known to have history of fatty liver in the past with evidence of possible cirrhosis on his CT abdomen. We will avoid nephrotoxic medications. This can be followed as an outpatient by his primary care physician.  8.  Deep vein thrombosis prophylaxis with subQ heparin.   CODE STATUS: The patient is FULL CODE.   TOTAL TIME SPENT ON ADMISSION AND PATIENT CARE: 55 minutes    ____________________________ Albertine Patricia, MD dse:dmm D: 10/17/2013 03:04:21 ET T: 10/17/2013 10:07:43 ET JOB#: [id number]  cc: Albertine Patricia, MD, <Dictator> Oliver Heitzenrater Graciela Husbands MD ELECTRONICALLY SIGNED 10/17/2013 23:32

## 2014-11-18 NOTE — Op Note (Signed)
PATIENT NAME:  Billy Freeman, Billy Freeman MR#:  [id number] DATE OF BIRTH:  1951-05-26  DATE OF PROCEDURE:  10/18/2013  PRINCIPAL DIAGNOSES:  1. Left ureterolithiasis.  2. Hydronephrosis.  3. Renal colic.   POSTOPERATIVE DIAGNOSIS: 1. Left ureterolithiasis.  2. Hydronephrosis.  3. Renal colic.   PROCEDURE: Left ureteroscopy with holmium with lithotripsy.   SURGEON: Edrick Oh, M.D.   ANESTHESIA: Laryngeal mask airway anesthesia.   INDICATIONS: The patient is a 64 year old gentleman with no prior history of nephrolithiasis. He presented with sudden onset left-sided flank pain. He was found to have an approximately 9 mm distal left ureteral calculus with obstruction. He had difficulty with pain control. He was admitted for IV hydration and pain control. He is scheduled to leave on an extended vacation in the near future. The decision was made to proceed with ureteroscopic stone removal due to ongoing pain and discomfort.   DESCRIPTION OF PROCEDURE: After informed consent was obtained, the patient was taken to the Operating Room and placed in the dorsal lithotomy position under laryngeal mask airway anesthesia. The patient was then prepped and draped in the usual standard fashion. The 54 French rigid cystoscope was introduced into the urethra under direct vision with no urethral abnormalities noted. Upon entering the prostate, moderate bilobar hypertrophy was noted with only partial visual obstruction. A very small median lobe component was appreciated upon entering the bladder. The mucosa was inspected in its entirety with no gross mucosal lesions noted. Prominent hypervascularity was noted along the trigone region and around the ureteral orifices, both cortices were easily identified. A guidewire was advanced into the left ureteral orifice. Mild resistance was met. With some manipulation the guidewire was able to be advanced into the upper pole collecting system under fluoroscopic guidance. The cystoscope was  then removed. The 6 French rigid ureteroscope was advanced into the urinary bladder. The scope was easily advanced into the left ureteral orifice with no resistance met. Approximately 1.5 cm into the lower ureter the stone was encountered. It was noted to be very crystalline in nature. The holmium laser fiber was then utilized to fragment the stone into multiple smaller pieces. Once the pieces were fragmented they were basket extracted. Utilizing a 0 tip 4-prong Nitinol basket after the bulk of the fragments were removed. The scope was advanced to the level of the ureteropelvic junction. Moderate dilation of the proximal ureter above the level of the stone was noted. No other appreciable abnormalities were noted. Minimal stone fragments remained. The area of stone impaction demonstrated minimal edema. The decision was made not to place a stent. The ureteroscope was removed. The cystoscope was replaced back into the urinary bladder. The bladder was irrigated of the fragments. These were collected and will be sent for stone analysis. The bladder was then drained. The cystoscope was removed. An 67 French red rubber catheter was then inserted into the urinary bladder. 40 mL of 2%  lidocaine was instilled into the urinary bladder for topical anesthesia. He was then returned to the supine position. He was awakened from laryngeal mask airway anesthesia and taken to the recovery room in stable condition. There were no problems or complications. The patient tolerated the procedure well.    ____________________________ Denice Bors. Jacqlyn Larsen, MD bsc:sg D: 10/18/2013 11:12:00 ET T: 10/18/2013 11:38:08 ET JOB#: [id number] Billy Wojciak [name] MD ELECTRONICALLY SIGNED 10/28/2013 8:14

## 2014-11-18 NOTE — Consult Note (Signed)
Requesting physician: Dr. Ginette Pitman Consulting physician: Dr. Bernardo Heater  Impression/ Recommendation: distal ureteral calculus-this stone is large and unlikely to pass.  If his pain is controlled a brief trial of passage was discussed.  Shockwave lithotripsy and ureteroscopic removal were also discussed.  Since he is scheduled to leave for [place] at the end of the week he desires the most expeditious treatment and will schedule ureteroscopy with laser lithotripsy.  My next available OR time is on 3/25.  Dr. Jacqlyn Larsen does have available time tomorrow and has agreed to perform the surgery with which the patient has agreed.procedure was discussed including the potential risks of bleeding, infection, ureteral injury and ureteral stricture.  He was informed that for a stone in this location the success rate is high however there is no guarantee that the stone will be able to be removed.  The potential need for a ureteral stent postoperatively was also discussed.  He indicated all questions were answered to his satisfaction and desires to proceed.   Reason for consultation:1.  Left ureteral calculus  History of present illness:   64 year old white male who was awake and on the morning of admission with severe left flank pain radiating to the left lower quadrant and left groin region.  The pain was rated 10/10 without identifiable precipitating, aggravating or alleviating factors.  He had nausea without vomiting.  Denied fever, chills or gross hematuria.  There were no significant lower urinary tract symptoms. He was evaluated in the emergency department and a CT of the abdomen pelvis showed a 4 x 10 mm left ureterovesical junction stone and he was admitted by the hospitalist service.  He denies previous history of urologic problems or stone disease.  His pain is presently minimal.  He is scheduled to leave on 3/28 for a 2 week trip to [place].  Past medical history: 1.  Hypertension.  2.  Sleep apnea on CPAP.  3.   Hyperlipidemia.  4.  GERD. 5.  Depression.  6.  Insomnia.  7.  History of basal cell carcinoma.  8.  Benign essential tremors.  9.  History of fatty liver.  10.  Gout.  11.  History of rosacea on erythromycin.    Past surgical history: 1.  Bilateral bunion surgeries.  2.  Status post multiple knee surgeries, arthroscopies.  3.  Liver biopsies in 2006 and 2010.  4.  History of skin cancer, excised on nose.  5.  Colonoscopy.  6.  Right knee meniscus surgery.  Medications: 1.  Aspirin 81 mg daily.  2.  Propranolol 20 mg oral 2 times a day.  3.  Citalopram 20 mg oral daily.  4.  Hydrochlorothiazide/losartan 12.5/100 mg oral daily.  5.  Temazepam 15 mg at bedtime as needed.  6.  Erythromycin 250 mg oral daily in the morning. This is for rosacea.    7.  Fish oil 1200 mg oral 2 times a day.  8.  Bupropion 150 mg 2 times daily.   Allergies:NKDA  Social history: No tobacco or alcohol use  Review of systems: Otherwise noncontributory except as per the history of present illness  Exam:  General-well developed, well-nourished male in no acute distress HEENT: Oral cavity clear Abd: Soft nontender without masses  Data: Urinalysis with microhematuria and no significant pyuria.  Creatinine 1.19.  CT was reviewed.  Electronic Signatures: Abbie Sons (MD)  (Signed on 23-Mar-15 19:15)  Authored  Last Updated: 23-Mar-15 19:15 by Abbie Sons (MD)

## 2015-02-26 DIAGNOSIS — Z87442 Personal history of urinary calculi: Secondary | ICD-10-CM | POA: Insufficient documentation

## 2015-10-02 DIAGNOSIS — R82994 Hypercalciuria: Secondary | ICD-10-CM | POA: Insufficient documentation

## 2016-01-24 ENCOUNTER — Telehealth: Payer: Self-pay | Admitting: Podiatry

## 2016-07-09 ENCOUNTER — Encounter: Payer: Self-pay | Admitting: *Deleted

## 2016-07-09 ENCOUNTER — Emergency Department
Admission: EM | Admit: 2016-07-09 | Discharge: 2016-07-09 | Disposition: A | Discharge status: Home / Self Care | Payer: Medicare Other | Attending: Emergency Medicine | Admitting: Emergency Medicine

## 2016-07-09 ENCOUNTER — Emergency Department: Payer: Medicare Other

## 2016-07-09 DIAGNOSIS — Z79899 Other long term (current) drug therapy: Secondary | ICD-10-CM | POA: Insufficient documentation

## 2016-07-09 DIAGNOSIS — Y999 Unspecified external cause status: Secondary | ICD-10-CM | POA: Diagnosis not present

## 2016-07-09 DIAGNOSIS — S32010A Wedge compression fracture of first lumbar vertebra, initial encounter for closed fracture: Secondary | ICD-10-CM | POA: Diagnosis not present

## 2016-07-09 DIAGNOSIS — Y929 Unspecified place or not applicable: Secondary | ICD-10-CM | POA: Insufficient documentation

## 2016-07-09 DIAGNOSIS — Y939 Activity, unspecified: Secondary | ICD-10-CM | POA: Diagnosis not present

## 2016-07-09 DIAGNOSIS — S3992XA Unspecified injury of lower back, initial encounter: Secondary | ICD-10-CM | POA: Diagnosis present

## 2016-07-09 DIAGNOSIS — W19XXXA Unspecified fall, initial encounter: Secondary | ICD-10-CM

## 2016-07-09 DIAGNOSIS — Z7982 Long term (current) use of aspirin: Secondary | ICD-10-CM | POA: Diagnosis not present

## 2016-07-09 DIAGNOSIS — J449 Chronic obstructive pulmonary disease, unspecified: Secondary | ICD-10-CM | POA: Insufficient documentation

## 2016-07-09 DIAGNOSIS — I10 Essential (primary) hypertension: Secondary | ICD-10-CM | POA: Diagnosis not present

## 2016-07-09 DIAGNOSIS — W1789XA Other fall from one level to another, initial encounter: Secondary | ICD-10-CM | POA: Diagnosis not present

## 2016-07-09 MED ORDER — OXYCODONE HCL 5 MG PO TABS: 5.0000 mg | ORAL_TABLET | Freq: Three times a day (TID) | ORAL | 0 refills | Status: DC | PRN

## 2016-07-09 MED ORDER — OXYCODONE-ACETAMINOPHEN 5-325 MG PO TABS: 1.0000 | ORAL_TABLET | Freq: Four times a day (QID) | ORAL | 0 refills | Status: DC | PRN

## 2016-07-09 NOTE — ED Notes (Signed)
Patient transported to CT 

## 2016-07-09 NOTE — Discharge Instructions (Signed)
Please call the number to follow-up with orthopedics in the next 1-2 weeks. Please take your prescribed pain medication as needed for back discomfort, as written. Please return to the emergency department for any acutely worsening pain, any weakness or numbness of either leg, or for any urinary or bowel incontinence (loss of control of your bladder or bowels).

## 2016-07-09 NOTE — ED Provider Notes (Signed)
[facility] Emergency Department Provider Note  Time seen: 8:12 PM  I have reviewed the triage vital signs and the nursing notes.   HISTORY  Chief Complaint Fall and Back Pain    HPI Billy Freeman is a 65 y.o. male with a past medical history of hypertension, presents to the emergency department after falling approximately 8 feet from a deer stand onto the ground. According to the patient he lost his balance on a deer stand falling out of it. The deer stent was approximately 8 feet off the ground. Patient states he landed on his feet, fell to his buttocks and rolled backwards. Thinks he hit his head but denies headache or loss of consciousness. Patient was able to walk approximately three quarters of a mild back to his car and drive himself home. Patient states he took an oxycodone tablet he had left over from an old surgery which reduced the pain from a 9/10, to currently a 2/10. Patient states pain in his lower back only. Denies any chest pain or trouble breathing. Denies neck pain or upper back pain. Denies headache. Denies any focal weakness or numbness. Patient was able to ambulate although he states with lower back pain.  Past Medical History:  Diagnosis Date  . Depression   . Hypertension   . Reflux     Patient Active Problem List   Diagnosis Date Noted  . CAFL (chronic airflow limitation) ([place]) 01/02/2014  . Clinical depression 01/02/2014  . Acid reflux 01/02/2014  . Gout 01/02/2014  . BP (high blood pressure) 01/02/2014  . Intermittent tremor 01/02/2014  . Obstructive apnea 01/02/2014  . Acne erythematosa 01/02/2014  . Calculus of kidney 11/23/2013  . Hydronephrosis 11/23/2013  . Renal colic 99991111  . Calculi, ureter 11/23/2013    No past surgical history on file.  Prior to Admission medications   Medication Sig Start Date End Date Taking? Authorizing Provider  aspirin EC 81 MG tablet Take by mouth.    Historical Provider, MD   azithromycin (ZITHROMAX) 250 MG tablet Take by mouth daily.    Historical Provider, MD  citalopram (CELEXA) 20 MG tablet  03/28/14   Historical Provider, MD  losartan-hydrochlorothiazide (HYZAAR) 50-12.5 MG per tablet Take 1 tablet by mouth daily.    Historical Provider, MD  Omega-3 Fatty Acids (FISH OIL) 1000 MG CAPS Take by mouth.    Historical Provider, MD  propranolol (INDERAL) 20 MG tablet  04/16/14   Historical Provider, MD  temazepam (RESTORIL) 15 MG capsule  03/19/14   Historical Provider, MD  Vitamin D, Ergocalciferol, (DRISDOL) 50000 UNITS CAPS capsule TAKE 1 CAPSULE BY MOUTH ONCE A WEEK 03/06/14   Historical Provider, MD    No Known Allergies  No family history on file.  Social History Social History  Substance Use Topics  . Smoking status: Never Smoker  . Smokeless tobacco: Never Used  . Alcohol use No    Review of Systems Constitutional: Negative for fever. Cardiovascular: Negative for chest pain. Respiratory: Negative for shortness of breath. Gastrointestinal: Negative for abdominal pain Musculoskeletal: Positive for lower back pain. Neurological: Negative for headaches, focal weakness or numbness. 10-point ROS otherwise negative.  ____________________________________________   PHYSICAL EXAM:  VITAL SIGNS: ED Triage Vitals  Enc Vitals Group     BP 07/09/16 1956 125/68     Pulse Rate 07/09/16 1956 72     Resp 07/09/16 1956 20     Temp 07/09/16 1956 97.8 F (36.6 C)     Temp Source  07/09/16 1956 Oral     SpO2 07/09/16 1956 93 %     Weight 07/09/16 1959 245 lb (111.1 kg)     Height 07/09/16 1959 6\' 4"  (1.93 m)     Head Circumference --      Peak Flow --      Pain Score 07/09/16 1959 5     Pain Loc --      Pain Edu? --      Excl. in [place]? --     Constitutional: Alert and oriented. Well appearing and in no distress. Eyes: Normal exam ENT   Head: Normocephalic and atraumatic.No C-spine tenderness.   Mouth/Throat: Mucous membranes are  moist. Cardiovascular: Normal rate, regular rhythm. No murmur Respiratory: Normal respiratory effort without tachypnea nor retractions. Breath sounds are clear  Gastrointestinal: Soft and nontender. No distention.   Musculoskeletal: Nontender with normal range of motion in all extremities. Good range of motion in arms and legs. No C-spine or T-spine tenderness. Mild lumbar spine tenderness. No ecchymosis, no deformity. Neurologic:  Normal speech and language. No gross focal neurologic deficits. Neurovascular intact. Sensation is intact and equal in bilateral lower extremities. Skin:  Skin is warm, dry and intact.  Psychiatric: Mood and affect are normal.   ____________________________________________     RADIOLOGY  CT shows L1 endplate compression fracture. Chest x-ray negative. Pelvis x-ray negative. Lumbar x-ray shows possible fracture. ____________________________________________   INITIAL IMPRESSION / ASSESSMENT AND PLAN / ED COURSE  Pertinent labs & imaging results that were available during my care of the patient were reviewed by me and considered in my medical decision making (see chart for details).  Patient presents to the emergency department after a fall from a deer stand approximately 8 foot height. Patient states he landed on his feet and fell to his buttocks. States moderate lower back pain, dull aching pain worse with ambulation. We'll obtain x-ray imaging of the chest (room air saturation of 93%), lower back and pelvis.  Questionable lumbar x-ray we'll proceed with a CT lumbar spine. CT of the lumbar spine shows acute mild compression fracture of L1. No retropulsion. No other injuries identified.  Patient will be discharged with Percocet as needed for pain control. Orthopedic follow-up. Patient agreeable to plan. I discussed strict return precautions for any worsening pain, numbness or weakness of either leg or urinary/bowel  incontinence.  ____________________________________________   FINAL CLINICAL IMPRESSION(S) / ED DIAGNOSES  Fall Compression fracture   Harvest Dark, MD 07/09/16 2202

## 2016-07-09 NOTE — ED Triage Notes (Signed)
Pt fell out of deer stand today approx 7-8 feet.  Pt has low back pain.  No loc. Denies neck pain.

## 2016-07-09 NOTE — ED Notes (Signed)
ED Provider at bedside. 

## 2016-07-15 DIAGNOSIS — S32010A Wedge compression fracture of first lumbar vertebra, initial encounter for closed fracture: Secondary | ICD-10-CM | POA: Insufficient documentation

## 2016-09-05 ENCOUNTER — Other Ambulatory Visit: Payer: Self-pay | Admitting: Internal Medicine

## 2016-09-05 DIAGNOSIS — I10 Essential (primary) hypertension: Secondary | ICD-10-CM

## 2016-09-05 DIAGNOSIS — G4733 Obstructive sleep apnea (adult) (pediatric): Secondary | ICD-10-CM

## 2016-09-05 DIAGNOSIS — R1031 Right lower quadrant pain: Secondary | ICD-10-CM

## 2016-09-05 DIAGNOSIS — J410 Simple chronic bronchitis: Secondary | ICD-10-CM

## 2016-09-05 DIAGNOSIS — R059 Cough, unspecified: Secondary | ICD-10-CM

## 2016-09-05 DIAGNOSIS — R918 Other nonspecific abnormal finding of lung field: Secondary | ICD-10-CM

## 2016-09-05 DIAGNOSIS — R1032 Left lower quadrant pain: Secondary | ICD-10-CM

## 2016-09-05 DIAGNOSIS — R05 Cough: Secondary | ICD-10-CM

## 2016-09-12 ENCOUNTER — Other Ambulatory Visit: Payer: Self-pay | Admitting: Physician Assistant

## 2016-09-12 DIAGNOSIS — R911 Solitary pulmonary nodule: Secondary | ICD-10-CM

## 2016-09-18 ENCOUNTER — Ambulatory Visit: Payer: Federal, State, Local not specified - PPO

## 2016-09-19 ENCOUNTER — Ambulatory Visit
Admission: RE | Admit: 2016-09-19 | Discharge: 2016-09-19 | Disposition: A | Discharge status: Home / Self Care | Payer: Medicare Other | Source: Ambulatory Visit | Attending: Physician Assistant | Admitting: Physician Assistant

## 2016-09-19 ENCOUNTER — Emergency Department
Admission: EM | Admit: 2016-09-19 | Discharge: 2016-09-19 | Disposition: A | Discharge status: Home / Self Care | Payer: Medicare Other | Attending: Student in an Organized Health Care Education/Training Program | Admitting: Student in an Organized Health Care Education/Training Program

## 2016-09-19 ENCOUNTER — Encounter: Payer: Self-pay | Admitting: Emergency Medicine

## 2016-09-19 ENCOUNTER — Emergency Department: Payer: Medicare Other

## 2016-09-19 DIAGNOSIS — R911 Solitary pulmonary nodule: Secondary | ICD-10-CM | POA: Insufficient documentation

## 2016-09-19 DIAGNOSIS — Z79899 Other long term (current) drug therapy: Secondary | ICD-10-CM | POA: Insufficient documentation

## 2016-09-19 DIAGNOSIS — R0689 Other abnormalities of breathing: Secondary | ICD-10-CM | POA: Diagnosis not present

## 2016-09-19 DIAGNOSIS — I2699 Other pulmonary embolism without acute cor pulmonale: Secondary | ICD-10-CM | POA: Insufficient documentation

## 2016-09-19 DIAGNOSIS — I10 Essential (primary) hypertension: Secondary | ICD-10-CM | POA: Diagnosis not present

## 2016-09-19 DIAGNOSIS — I700 Atherosclerosis of aorta: Secondary | ICD-10-CM

## 2016-09-19 DIAGNOSIS — I2510 Atherosclerotic heart disease of native coronary artery without angina pectoris: Secondary | ICD-10-CM

## 2016-09-19 DIAGNOSIS — Z7982 Long term (current) use of aspirin: Secondary | ICD-10-CM | POA: Insufficient documentation

## 2016-09-19 DIAGNOSIS — J449 Chronic obstructive pulmonary disease, unspecified: Secondary | ICD-10-CM | POA: Insufficient documentation

## 2016-09-19 DIAGNOSIS — I824Z3 Acute embolism and thrombosis of unspecified deep veins of distal lower extremity, bilateral: Secondary | ICD-10-CM

## 2016-09-19 DIAGNOSIS — J439 Emphysema, unspecified: Secondary | ICD-10-CM | POA: Insufficient documentation

## 2016-09-19 HISTORY — DX: Other pulmonary embolism without acute cor pulmonale: I26.99

## 2016-09-19 LAB — CBC WITH DIFFERENTIAL/PLATELET
BASOS PCT: 1 %
Basophils Absolute: 0 10*3/uL (ref 0–0.1)
EOS ABS: 0.1 10*3/uL (ref 0–0.7)
Eosinophils Relative: 1 %
HCT: 48.8 % (ref 40.0–52.0)
HEMOGLOBIN: 16.9 g/dL (ref 13.0–18.0)
Lymphocytes Relative: 16 %
Lymphs Abs: 1.2 10*3/uL (ref 1.0–3.6)
MCH: 32.2 pg (ref 26.0–34.0)
MCHC: 34.6 g/dL (ref 32.0–36.0)
MCV: 93.3 fL (ref 80.0–100.0)
MONOS PCT: 11 %
Monocytes Absolute: 0.8 10*3/uL (ref 0.2–1.0)
NEUTROS PCT: 73 %
Neutro Abs: 5.6 10*3/uL (ref 1.4–6.5)
PLATELETS: 312 10*3/uL (ref 150–440)
RBC: 5.24 MIL/uL (ref 4.40–5.90)
RDW: 14.1 % (ref 11.5–14.5)
WBC: 7.8 10*3/uL (ref 3.8–10.6)

## 2016-09-19 LAB — COMPREHENSIVE METABOLIC PANEL
ALK PHOS: 81 U/L (ref 38–126)
ALT: 79 U/L — ABNORMAL HIGH (ref 17–63)
ANION GAP: 8 (ref 5–15)
AST: 75 U/L — ABNORMAL HIGH (ref 15–41)
Albumin: 4.4 g/dL (ref 3.5–5.0)
BUN: 18 mg/dL (ref 6–20)
CALCIUM: 9.4 mg/dL (ref 8.9–10.3)
CHLORIDE: 96 mmol/L — AB (ref 101–111)
CO2: 28 mmol/L (ref 22–32)
Creatinine, Ser: 0.88 mg/dL (ref 0.61–1.24)
GFR calc non Af Amer: >60 mL/min (ref >60)
GLUCOSE: 94 mg/dL (ref 65–99)
POTASSIUM: 3.8 mmol/L (ref 3.5–5.1)
SODIUM: 132 mmol/L — AB (ref 135–145)
Total Bilirubin: 1.3 mg/dL — ABNORMAL HIGH (ref 0.3–1.2)
Total Protein: 8.3 g/dL — ABNORMAL HIGH (ref 6.5–8.1)

## 2016-09-19 LAB — TROPONIN I: Troponin I: <0.03 ng/mL (ref <0.03)

## 2016-09-19 LAB — BRAIN NATRIURETIC PEPTIDE: B NATRIURETIC PEPTIDE 5: 46 pg/mL (ref 0.0–100.0)

## 2016-09-19 MED ORDER — APIXABAN 5 MG PO TABS
10.0000 mg | ORAL_TABLET | Freq: Two times a day (BID) | ORAL | Status: DC
  Administered 2016-09-19: 10 mg via ORAL
  Filled 2016-09-19: qty 2

## 2016-09-19 MED ORDER — IOPAMIDOL (ISOVUE-300) INJECTION 61%
75.0000 mL | Freq: Once | INTRAVENOUS | Status: AC | PRN
  Administered 2016-09-19: 75 mL via INTRAVENOUS

## 2016-09-19 MED ORDER — APIXABAN 5 MG PO TABS: 5.0000 mg | ORAL_TABLET | Freq: Two times a day (BID) | ORAL | 0 refills | Status: AC

## 2016-09-19 NOTE — ED Provider Notes (Signed)
[facility] Emergency Department Provider Note    First MD Initiated Contact with Patient 09/19/16 1832     (approximate)  I have reviewed the triage vital signs and the nursing notes.   HISTORY  Chief Complaint Pulmonary Embolism    HPI Billy Freeman is a 66 y.o. male pleasant gentleman with history of emphysema as well as recent hospitalization for vertebral fracture presents to the ER at the recommendation of his primary care physician due to CT imaging of pulmonary embolism. Patient was being worked up for chronic cough with intermittent hemoptysis. Last seen on the 16th of this month and had a outpatient CT scan of the chest ordered to further characterize evidence of nodules. CT imaging today shows evidence of nodules but no evidence of significant pneumonia or consolidation. There are scattered nonocclusive pulmonary emboli without any evidence of saddle embolism. He states that his symptoms have actually improved since being started on steroids and antibiotics with breathing treatments. He denies any chest pain. No lower extremity swelling. Does not know of any history of blood clots.  CT report: 1. 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 2. Multiple small pulmonary nodules scattered throughout the lungs bilaterally measuring up to a mean diameter of 6 mm, as above. Non-contrast chest CT at 3-6 months is recommended. If the nodules are stable at time of repeat CT, then future CT at 18-24 months (from today's scan) is considered optional for low-risk patients, but is recommended for high-risk patients. This recommendation follows the consensus statement: Guidelines for Management of Incidental Pulmonary Nodules Detected on CT Images: From the Fleischner Society 2017; Radiology 2017; 284:228-243. 3. Aortic atherosclerosis, in addition to two vessel coronary artery disease. Please note that although the  presence of coronary artery calcium documents the presence of coronary artery disease, the severity of this disease and any potential stenosis cannot be assessed on this non-gated CT examination. Assessment for potential risk factor modification, dietary therapy or pharmacologic therapy may be warranted, if clinically indicated. 4. Diffuse bronchial wall thickening with moderate centrilobular and paraseptal emphysema ; imaging findings suggestive of underlying COPD.   Past Medical History:  Diagnosis Date  . Depression   . Hypertension   . Pulmonary embolism ([place])   . Reflux    No family history on file. History reviewed. No pertinent surgical history. Patient Active Problem List   Diagnosis Date Noted  . CAFL (chronic airflow limitation) ([place]) 01/02/2014  . Clinical depression 01/02/2014  . Acid reflux 01/02/2014  . Gout 01/02/2014  . BP (high blood pressure) 01/02/2014  . Intermittent tremor 01/02/2014  . Obstructive apnea 01/02/2014  . Acne erythematosa 01/02/2014  . Calculus of kidney 11/23/2013  . Hydronephrosis 11/23/2013  . Renal colic [id number]  . Calculi, ureter 11/23/2013      Prior to Admission medications   Medication Sig Start Date End Date Taking? Authorizing Provider  aspirin EC 81 MG tablet Take 81 mg by mouth daily.     Historical Provider, MD  azithromycin (ZITHROMAX) 250 MG tablet Take 250 mg by mouth daily.     Historical Provider, MD  citalopram (CELEXA) 20 MG tablet Take 20 mg by mouth daily.  03/28/14   Historical Provider, MD  indapamide (LOZOL) 2.5 MG tablet Take 2.5 mg by mouth daily.    Historical Provider, MD  losartan (COZAAR) 50 MG tablet Take 50 mg by mouth daily.    Historical Provider, MD  Omega-3 Fatty Acids ([place]  OIL) 1000 MG CAPS Take 1,000 mg by mouth 2 (two) times daily.     Historical Provider, MD  oxyCODONE (ROXICODONE) 5 MG immediate release tablet Take 1 tablet (5 mg total) by mouth every 8 (eight) hours as needed. 07/09/16  07/09/17  Harvest Dark, MD  propranolol (INDERAL) 20 MG tablet 20 mg 2 (two) times daily.  04/16/14   Historical Provider, MD  temazepam (RESTORIL) 15 MG capsule Take 15 mg by mouth at bedtime.  03/19/14   Historical Provider, MD  Tetrahydrozoline HCl (EYE DROPS OP) Place 1 drop into both eyes daily.    Historical Provider, MD    Allergies Oxycodone    Social History Social History  Substance Use Topics  . Smoking status: Never Smoker  . Smokeless tobacco: Never Used  . Alcohol use No    Review of Systems Patient denies headaches, rhinorrhea, blurry vision, numbness, shortness of breath, chest pain, edema, cough, abdominal pain, nausea, vomiting, diarrhea, dysuria, fevers, rashes or hallucinations unless otherwise stated above in HPI. ____________________________________________   PHYSICAL EXAM:  VITAL SIGNS: Vitals:   09/19/16 1827  BP: 130/88  Pulse: 78  Resp: 18  Temp: 98 F (36.7 C)    Constitutional: Alert and oriented. Well appearing and in no acute distress. Eyes: Conjunctivae are normal. PERRL. EOMI. Head: Atraumatic. Nose: No congestion/rhinnorhea. Mouth/Throat: Mucous membranes are moist.  Oropharynx non-erythematous. Neck: No stridor. Painless ROM. No cervical spine tenderness to palpation Hematological/Lymphatic/Immunilogical: No cervical lymphadenopathy. Cardiovascular: Normal rate, regular rhythm. Grossly normal heart sounds.  Good peripheral circulation. Respiratory: Normal respiratory effort.  No retractions. Lungs with coarse breath sounds throughout. Gastrointestinal: Soft and nontender. No distention. No abdominal bruits. No CVA tenderness. Genitourinary:  Musculoskeletal: No lower extremity tenderness nor edema.  No joint effusions. Neurologic:  Normal speech and language. No gross focal neurologic deficits are appreciated. No gait instability. Skin:  Skin is warm, dry and intact. No rash noted. Psychiatric: Mood and affect are normal. Speech  and behavior are normal.  ____________________________________________   LABS (all labs ordered are listed, but only abnormal results are displayed)  Results for orders placed or performed during the hospital encounter of 09/19/16 (from the past 24 hour(s))  CBC with Differential/Platelet     Status: None   Collection Time: 09/19/16  6:43 PM  Result Value Ref Range   WBC 7.8 3.8 - 10.6 K/uL   RBC 5.24 4.40 - 5.90 MIL/uL   Hemoglobin 16.9 13.0 - 18.0 g/dL   HCT 48.8 40.0 - 52.0 %   MCV 93.3 80.0 - 100.0 fL   MCH 32.2 26.0 - 34.0 pg   MCHC 34.6 32.0 - 36.0 g/dL   RDW 14.1 11.5 - 14.5 %   Platelets 312 150 - 440 K/uL   Neutrophils Relative % 73 %   Neutro Abs 5.6 1.4 - 6.5 K/uL   Lymphocytes Relative 16 %   Lymphs Abs 1.2 1.0 - 3.6 K/uL   Monocytes Relative 11 %   Monocytes Absolute 0.8 0.2 - 1.0 K/uL   Eosinophils Relative 1 %   Eosinophils Absolute 0.1 0 - 0.7 K/uL   Basophils Relative 1 %   Basophils Absolute 0.0 0 - 0.1 K/uL  Comprehensive metabolic panel     Status: Abnormal   Collection Time: 09/19/16  6:43 PM  Result Value Ref Range   Sodium 132 (L) 135 - 145 mmol/L   Potassium 3.8 3.5 - 5.1 mmol/L   Chloride 96 (L) 101 - 111 mmol/L   CO2 28  22 - 32 mmol/L   Glucose, Bld 94 65 - 99 mg/dL   BUN 18 6 - 20 mg/dL   Creatinine, Ser 0.88 0.61 - 1.24 mg/dL   Calcium 9.4 8.9 - 10.3 mg/dL   Total Protein 8.3 (H) 6.5 - 8.1 g/dL   Albumin 4.4 3.5 - 5.0 g/dL   AST 75 (H) 15 - 41 U/L   ALT 79 (H) 17 - 63 U/L   Alkaline Phosphatase 81 38 - 126 U/L   Total Bilirubin 1.3 (H) 0.3 - 1.2 mg/dL   GFR calc non Af Amer >60 >60 mL/min   GFR calc Af Amer >60 >60 mL/min   Anion gap 8 5 - 15  Troponin I     Status: None   Collection Time: 09/19/16  6:43 PM  Result Value Ref Range   Troponin I <0.03 <0.03 ng/mL  Brain natriuretic peptide     Status: None   Collection Time: 09/19/16  6:43 PM  Result Value Ref Range   B Natriuretic Peptide 46.0 0.0 - 100.0 pg/mL    ____________________________________________  EKG My review and personal interpretation at Time: 18:41   Indication: PE  Rate: 80  Rhythm: sinus Axis: normal Other: no evidence acute ischemia, normal intervals ____________________________________________  RADIOLOGY  I personally reviewed all radiographic images ordered to evaluate for the above acute complaints and reviewed radiology reports and findings.  These findings were personally discussed with the patient.  Please see medical record for radiology report.  ____________________________________________   PROCEDURES  Procedure(s) performed:  Procedures    Critical Care performed: no ____________________________________________   INITIAL IMPRESSION / ASSESSMENT AND PLAN / ED COURSE  Pertinent labs & imaging results that were available during my care of the patient were reviewed by me and considered in my medical decision making (see chart for details).  Ddx:  Pe, cor pulonale, chf, pna, malignancy  Billy Freeman is a 66 y.o. 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  Denies any chest pain or shortness of breath. Arrives afebrile and hemodynamically stable. CT imaging shows no evidence of occlusive emboli. Patient with no known malignancy. Does not wear home oxygen. Likely secondary to recent immobilization with multiple admissions. Will order laboratory evaluation to evaluate for evidence of some mass of PE though clinically appears well.  Clinical Course as of Sep 20 18  Fri Sep 19, 2016  1952 Patient was able to ambulate without any significant distress. Pulse ox went down to the low 90s but denied any discomfort.  [PR]  1953 His blood work is reassuring in that it shows no evidence of elevated BNP or elevated troponin.  [PR]  2132 Patient tolerated WITHOUT any distress. Does have bilateral lower extremity DVTs likely source. Patient otherwise hemodynamically stable and in no acute  distress. The patient is stable for close outpatient follow-up. He is low risk by HESTIA criteria which further supports he is appropriate for outpatient management.  Have discussed with the patient and available family all diagnostics and treatments performed thus far and all questions were answered to the best of my ability. The patient demonstrates understanding and agreement with plan.   [PR]    Clinical Course User Index [PR] Merlyn Lot, MD     ____________________________________________   FINAL CLINICAL IMPRESSION(S) / ED DIAGNOSES  Final diagnoses:  Other pulmonary embolism without acute cor pulmonale, unspecified chronicity ([place])  Deep vein thrombosis (DVT) of distal vein of both lower extremities, unspecified chronicity ([place])  NEW MEDICATIONS STARTED DURING THIS VISIT:  New Prescriptions   No medications on file     Note:  This document was prepared using Dragon voice recognition software and may include unintentional dictation errors.    Merlyn Lot, MD 09/20/16 [phone]

## 2016-09-19 NOTE — ED Triage Notes (Signed)
Pt arrived POV. Pt states he had a CT scan of chest today and shows PE in lung. Pt denies any chest pain or shortness of breath. Was instructed to come to ED.

## 2016-12-03 ENCOUNTER — Other Ambulatory Visit: Payer: Self-pay | Admitting: Internal Medicine

## 2016-12-03 DIAGNOSIS — R918 Other nonspecific abnormal finding of lung field: Secondary | ICD-10-CM

## 2017-01-02 ENCOUNTER — Ambulatory Visit
Admission: RE | Admit: 2017-01-02 | Discharge: 2017-01-02 | Disposition: A | Discharge status: Home / Self Care | Payer: Medicare Other | Source: Ambulatory Visit | Attending: Internal Medicine | Admitting: Internal Medicine

## 2017-01-02 DIAGNOSIS — K7460 Unspecified cirrhosis of liver: Secondary | ICD-10-CM | POA: Insufficient documentation

## 2017-01-02 DIAGNOSIS — R918 Other nonspecific abnormal finding of lung field: Secondary | ICD-10-CM | POA: Diagnosis not present

## 2017-01-02 DIAGNOSIS — M4856XD Collapsed vertebra, not elsewhere classified, lumbar region, subsequent encounter for fracture with routine healing: Secondary | ICD-10-CM | POA: Insufficient documentation

## 2017-01-02 LAB — POCT I-STAT CREATININE: Creatinine, Ser: 1 mg/dL (ref 0.61–1.24)

## 2017-01-02 MED ORDER — IOPAMIDOL (ISOVUE-300) INJECTION 61%
75.0000 mL | Freq: Once | INTRAVENOUS | Status: AC | PRN
  Administered 2017-01-02: 75 mL via INTRAVENOUS

## 2017-01-21 ENCOUNTER — Other Ambulatory Visit: Payer: Self-pay | Admitting: Specialist

## 2017-01-21 DIAGNOSIS — R918 Other nonspecific abnormal finding of lung field: Secondary | ICD-10-CM

## 2017-04-23 ENCOUNTER — Telehealth: Payer: Self-pay | Admitting: *Deleted

## 2017-04-23 NOTE — Telephone Encounter (Signed)
LMTCB needs new pt consult with DR for possible Bronchoscopy, (recurrent hemoptysis).

## 2017-04-27 NOTE — Progress Notes (Signed)
[facility] Consultation      Assessment and Plan:  The patient is a 66 year old male with history of pulmonary medicine/DVT, on Eliquis, with persistent hemoptysis.  Hemoptysis. -Discussed with patient that we will perform central airway inspection via bronchoscopy.  -Discussed that the Eliquis may be contributing to the hemoptysis, we will need to stop this before the procedure.  Lung nodules. -Stable lung nodules, patient has a history of living in the [place]. -History of foreign travel with the [place], recommend TB screening if not already performed, we'll also perform AFB testing at the time of bronchoscopy. -Discussed with patient and wife that he will not be able to biopsy the lung nodules during the bronchoscopy, as they are too small and too peripheral.   Pulmonary embolus/DVT. -Currently on Eliquis, which may be contributing to hemoptysis. -The patient is now greater than 6 months out from his initial event, I think it is safe for him to hold Eliquis without bridging.       Date: 04/28/2017  MRN# [id number] Billy Freeman 1950/12/09  Referring Physician:   Marcas Freeman is a 66 y.o. old male seen in consultation for chief complaint of:    Chief Complaint  Patient presents with  . Advice Only    Referred by Billy Freeman possible bronch pulmonary nodules. Patient started cough up  dark red blood late August. Pt coughed blood this am.    HPI:   The patient is a 66 year old male, who sees Billy Freeman, who was diagnosed with pulmonary embolus and in February 2018. He was noted to have hemoptysis and is referred for possible bronchoscopy.  He was also found to have DVT, he was treated with eliquis. He had a repeat CT which did not look worse. He was having issues with colon and prostate infection which had cleared up at that time. In August he started coughing up blood, he was on eliquis on that time which was thought it was contributing, he was having a  persistent cough at that time. He was followed with repeat CXRs which appeared to be stable.  Last week he continued to cough up blood, it is much less. . When it first started it was bright red, but then it because darker. Currently he coughs up dark small amounts, more in morning and mid morning.  He smoked pot until Feb. His father was a heavy smoker. 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 HIs father had prostate, throat ca, mom had stomach cancer.  He denies dyspnea, he walks 3-4 miles per day.  He was in the [employer] and was overseas a lot. He l lived in [place] for a year.   Images personally reviewed; CT chest 01/02/2017; bibasilar emphysematous changes, streaky atelectasis in the right lower lobe, tiny peripheral nodules, ., No appreciable mediastinal lymphadenopathy is noted. Nodules not significantly changed  from previous CT chest on 09/19/16 where right lung pulmonary embolus was noted.   PMHX:   Past Medical History:  Diagnosis Date  . Depression   . Hypertension   . Pulmonary embolism ([place])   . Reflux    Surgical Hx:  History reviewed. No pertinent surgical history. Family Hx:  History reviewed. No pertinent family history. Social Hx:   Social History  Substance Use Topics  . Smoking status: Never Smoker  . Smokeless tobacco: Never Used  . Alcohol use No   Medication:    Current Outpatient Prescriptions:  .  albuterol (PROVENTIL HFA;VENTOLIN HFA) 108 (90 Base) MCG/ACT inhaler,  Inhale 2 puffs into the lungs as directed., Disp: , Rfl:  .  apixaban (ELIQUIS) 5 MG TABS tablet, Take 1 tablet (5 mg total) by mouth 2 (two) times daily. Take 10 mg (two pills) twice daily for seven days, then take 5mg  (one pill) twice daily., Disp: 60 tablet, Rfl: 0 .  azithromycin (ZITHROMAX) 250 MG tablet, Take 250 mg by mouth daily. , Disp: , Rfl:  .  budesonide-formoterol (SYMBICORT) 160-4.5 MCG/ACT inhaler, Inhale 2 puffs into the lungs 2 (two) times daily., Disp: , Rfl:  .  citalopram  (CELEXA) 20 MG tablet, Take 20 mg by mouth daily. , Disp: , Rfl:  .  indapamide (LOZOL) 2.5 MG tablet, Take 2.5 mg by mouth daily., Disp: , Rfl:  .  losartan (COZAAR) 50 MG tablet, Take 50 mg by mouth daily., Disp: , Rfl:  .  Omega-3 Fatty Acids (FISH OIL) 1200 MG CAPS, Take 1,000 mg by mouth 2 (two) times daily. , Disp: , Rfl:  .  propranolol (INDERAL) 20 MG tablet, 20 mg 2 (two) times daily. , Disp: , Rfl:  .  temazepam (RESTORIL) 15 MG capsule, Take 15 mg by mouth at bedtime. , Disp: , Rfl:    Allergies:  Oxycodone  Review of Systems: Gen:  Denies  fever, sweats, chills HEENT: Denies blurred vision, double vision. bleeds, sore throat Cvc:  No dizziness, chest pain. Resp:   Denies cough or sputum production, shortness of breath Gi: Denies swallowing difficulty, stomach pain. Gu:  Denies bladder incontinence, burning urine Ext:   No Joint pain, stiffness. Skin: No skin rash,  hives  Endoc:  No polyuria, polydipsia. Psych: No depression, insomnia. Other:  All other systems were reviewed with the patient and were negative other that what is mentioned in the HPI.   Physical Examination:   VS: BP 110/70 (BP Location: Left Arm, Cuff Size: Normal)   Pulse (!) 56   Resp 16   Ht 6\' 4"  (1.93 m)   Wt 243 lb (110.2 kg)   SpO2 95%   BMI 29.58 kg/m   General Appearance: No distress  Neuro:without focal findings,  speech normal,  HEENT: PERRLA, EOM intact.   Pulmonary: normal breath sounds, No wheezing.  CardiovascularNormal S1,S2.  No m/r/g.   Abdomen: Benign, Soft, non-tender. Renal:  No costovertebral tenderness  GU:  No performed at this time. Endoc: No evident thyromegaly, no signs of acromegaly. Skin:   warm, no rashes, no ecchymosis  Extremities: normal, no cyanosis, clubbing.  Other findings:    LABORATORY PANEL:   CBC No results for input(s): WBC, HGB, HCT, PLT in the last 168  hours. ------------------------------------------------------------------------------------------------------------------  Chemistries  No results for input(s): NA, K, CL, CO2, GLUCOSE, BUN, CREATININE, CALCIUM, MG, AST, ALT, ALKPHOS, BILITOT in the last 168 hours.  Invalid input(s): GFRCGP ------------------------------------------------------------------------------------------------------------------  Cardiac Enzymes No results for input(s): TROPONINI in the last 168 hours. ------------------------------------------------------------  RADIOLOGY:  No results found.     Thank  you for the consultation and for allowing [facility] to assist in the care of your patient. Our recommendations are noted above.  Please contact us if we can be of further service.   Marda Stalker, MD.  Board Certified in Internal Medicine, Pulmonary Medicine, [place], and Sleep Medicine.  [facility] Office Number: [phone]  Patricia Pesa, M.D.  Merton Border, M.D  04/28/2017

## 2017-04-28 ENCOUNTER — Ambulatory Visit (INDEPENDENT_AMBULATORY_CARE_PROVIDER_SITE_OTHER): Payer: Medicare Other | Admitting: Internal Medicine

## 2017-04-28 ENCOUNTER — Other Ambulatory Visit: Payer: Self-pay | Admitting: Internal Medicine

## 2017-04-28 ENCOUNTER — Encounter: Payer: Self-pay | Admitting: Internal Medicine

## 2017-04-28 ENCOUNTER — Telehealth: Payer: Self-pay | Admitting: Internal Medicine

## 2017-04-28 VITALS — BP 110/70 | HR 56 | Resp 16 | Ht 76.0 in | Wt 243.0 lb

## 2017-04-28 DIAGNOSIS — R042 Hemoptysis: Secondary | ICD-10-CM

## 2017-04-28 NOTE — Telephone Encounter (Signed)
Patient informed bronch 04/30/17 arrival 9 am for 10 am procedure. NPO after midnight. Driver must stay during procedure. He is to stop Eliquis today. Patient verbalizes understanding no further questions.

## 2017-04-28 NOTE — Telephone Encounter (Signed)
Please call pt regarding bronchoscopy on Thursday and if his medication should be stopped. Ok to leave a detailed message.

## 2017-04-28 NOTE — Patient Instructions (Signed)
--  Will plan for bronchoscopy tentatively planned for Thursday AM, pending clearance by [facility] scheduling.   --Stop Eliquis until after procedure on Thursday. If procedure is not this week, then resume eliquis until instructed.

## 2017-04-30 ENCOUNTER — Encounter
Admission: RE | Disposition: A | Discharge status: Home / Self Care | Payer: Self-pay | Source: Ambulatory Visit | Attending: Internal Medicine

## 2017-04-30 ENCOUNTER — Ambulatory Visit
Admission: RE | Admit: 2017-04-30 | Discharge: 2017-04-30 | Disposition: A | Discharge status: Home / Self Care | Payer: Medicare Other | Source: Ambulatory Visit | Attending: Internal Medicine | Admitting: Internal Medicine

## 2017-04-30 DIAGNOSIS — Z7951 Long term (current) use of inhaled steroids: Secondary | ICD-10-CM | POA: Diagnosis not present

## 2017-04-30 DIAGNOSIS — Z7901 Long term (current) use of anticoagulants: Secondary | ICD-10-CM | POA: Insufficient documentation

## 2017-04-30 DIAGNOSIS — J42 Unspecified chronic bronchitis: Secondary | ICD-10-CM | POA: Diagnosis not present

## 2017-04-30 DIAGNOSIS — Z79899 Other long term (current) drug therapy: Secondary | ICD-10-CM | POA: Insufficient documentation

## 2017-04-30 DIAGNOSIS — F329 Major depressive disorder, single episode, unspecified: Secondary | ICD-10-CM | POA: Insufficient documentation

## 2017-04-30 DIAGNOSIS — I10 Essential (primary) hypertension: Secondary | ICD-10-CM | POA: Diagnosis not present

## 2017-04-30 DIAGNOSIS — R042 Hemoptysis: Secondary | ICD-10-CM

## 2017-04-30 DIAGNOSIS — R918 Other nonspecific abnormal finding of lung field: Secondary | ICD-10-CM | POA: Diagnosis not present

## 2017-04-30 DIAGNOSIS — Z86711 Personal history of pulmonary embolism: Secondary | ICD-10-CM | POA: Diagnosis not present

## 2017-04-30 HISTORY — DX: Sleep apnea, unspecified: G47.30

## 2017-04-30 HISTORY — PX: FLEXIBLE BRONCHOSCOPY: SHX5094

## 2017-04-30 SURGERY — BRONCHOSCOPY, FLEXIBLE
Anesthesia: Moderate Sedation

## 2017-04-30 MED ORDER — LIDOCAINE HCL 2 % EX GEL: 1.0000 "application " | Freq: Once | CUTANEOUS | Status: DC

## 2017-04-30 MED ORDER — PHENYLEPHRINE HCL 0.25 % NA SOLN
1.0000 | Freq: Four times a day (QID) | NASAL | Status: DC | PRN
  Filled 2017-04-30: qty 15

## 2017-04-30 MED ORDER — BUTAMBEN-TETRACAINE-BENZOCAINE 2-2-14 % EX AERO
1.0000 | INHALATION_SPRAY | Freq: Once | CUTANEOUS | Status: DC
  Filled 2017-04-30: qty 20

## 2017-04-30 MED ORDER — FENTANYL CITRATE (PF) 100 MCG/2ML IJ SOLN
INTRAMUSCULAR | Status: AC | PRN
  Administered 2017-04-30 (×4): 25 ug via INTRAVENOUS

## 2017-04-30 MED ORDER — FENTANYL CITRATE (PF) 100 MCG/2ML IJ SOLN
INTRAMUSCULAR | Status: AC
  Filled 2017-04-30: qty 4

## 2017-04-30 MED ORDER — SODIUM CHLORIDE 0.9 % IV SOLN: INTRAVENOUS | Status: DC

## 2017-04-30 MED ORDER — MIDAZOLAM HCL 2 MG/2ML IJ SOLN
INTRAMUSCULAR | Status: AC | PRN
  Administered 2017-04-30 (×3): 2 mg via INTRAVENOUS

## 2017-04-30 MED ORDER — MIDAZOLAM HCL 5 MG/5ML IJ SOLN
INTRAMUSCULAR | Status: AC
  Filled 2017-04-30: qty 10

## 2017-04-30 NOTE — Interval H&P Note (Signed)
History and Physical Interval Note:  04/30/2017 10:09 AM  Billy Freeman  has presented today for surgery, with the diagnosis of Regular Bronch   Hemoptysis [facility] emailed  The various methods of treatment have been discussed with the patient and family. After consideration of risks, benefits and other options for treatment, the patient has consented to  Procedure(s): FLEXIBLE BRONCHOSCOPY (N/A) as a surgical intervention .  The patient's history has been reviewed, patient examined, no change in status, stable for surgery.  I have reviewed the patient's chart and labs.  Questions were answered to the patient's satisfaction.     Laverle Hobby

## 2017-04-30 NOTE — H&P (View-Only) (Signed)
[facility] Consultation      Assessment and Plan:  The patient is a 66 year old male with history of pulmonary medicine/DVT, on Eliquis, with persistent hemoptysis.  Hemoptysis. -Discussed with patient that we will perform central airway inspection via bronchoscopy.  -Discussed that the Eliquis may be contributing to the hemoptysis, we will need to stop this before the procedure.  Lung nodules. -Stable lung nodules, patient has a history of living in the [place]. -History of foreign travel with the [place], recommend TB screening if not already performed, we'll also perform AFB testing at the time of bronchoscopy. -Discussed with patient and wife that he will not be able to biopsy the lung nodules during the bronchoscopy, as they are too small and too peripheral.   Pulmonary embolus/DVT. -Currently on Eliquis, which may be contributing to hemoptysis. -The patient is now greater than 6 months out from his initial event, I think it is safe for him to hold Eliquis without bridging.       Date: 04/28/2017  MRN# [id number] Bernis Schreur 07/15/1951  Referring Physician:   Jevaun Strick is a 66 y.o. old male seen in consultation for chief complaint of:    Chief Complaint  Patient presents with  . Advice Only    Referred by Dr.Fleming possible bronch pulmonary nodules. Patient started cough up  dark red blood late August. Pt coughed blood this am.    HPI:   The patient is a 66 year old male, who sees Dr. Raul Del, who was diagnosed with pulmonary embolus and in February 2018. He was noted to have hemoptysis and is referred for possible bronchoscopy.  He was also found to have DVT, he was treated with eliquis. He had a repeat CT which did not look worse. He was having issues with colon and prostate infection which had cleared up at that time. In August he started coughing up blood, he was on eliquis on that time which was thought it was contributing, he was having a  persistent cough at that time. He was followed with repeat CXRs which appeared to be stable.  Last week he continued to cough up blood, it is much less. . When it first started it was bright red, but then it because darker. Currently he coughs up dark small amounts, more in morning and mid morning.  He smoked pot until Feb. His father was a heavy smoker. 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 HIs father had prostate, throat ca, mom had stomach cancer.  He denies dyspnea, he walks 3-4 miles per day.  He was in the [employer] and was overseas a lot. He l lived in [place] for a year.   Images personally reviewed; CT chest 01/02/2017; bibasilar emphysematous changes, streaky atelectasis in the right lower lobe, tiny peripheral nodules, ., No appreciable mediastinal lymphadenopathy is noted. Nodules not significantly changed  from previous CT chest on 09/19/16 where right lung pulmonary embolus was noted.   PMHX:   Past Medical History:  Diagnosis Date  . Depression   . Hypertension   . Pulmonary embolism ([place])   . Reflux    Surgical Hx:  History reviewed. No pertinent surgical history. Family Hx:  History reviewed. No pertinent family history. Social Hx:   Social History  Substance Use Topics  . Smoking status: Never Smoker  . Smokeless tobacco: Never Used  . Alcohol use No   Medication:    Current Outpatient Prescriptions:  .  albuterol (PROVENTIL HFA;VENTOLIN HFA) 108 (90 Base) MCG/ACT inhaler,  Inhale 2 puffs into the lungs as directed., Disp: , Rfl:  .  apixaban (ELIQUIS) 5 MG TABS tablet, Take 1 tablet (5 mg total) by mouth 2 (two) times daily. Take 10 mg (two pills) twice daily for seven days, then take 5mg  (one pill) twice daily., Disp: 60 tablet, Rfl: 0 .  azithromycin (ZITHROMAX) 250 MG tablet, Take 250 mg by mouth daily. , Disp: , Rfl:  .  budesonide-formoterol (SYMBICORT) 160-4.5 MCG/ACT inhaler, Inhale 2 puffs into the lungs 2 (two) times daily., Disp: , Rfl:  .  citalopram  (CELEXA) 20 MG tablet, Take 20 mg by mouth daily. , Disp: , Rfl:  .  indapamide (LOZOL) 2.5 MG tablet, Take 2.5 mg by mouth daily., Disp: , Rfl:  .  losartan (COZAAR) 50 MG tablet, Take 50 mg by mouth daily., Disp: , Rfl:  .  Omega-3 Fatty Acids (FISH OIL) 1200 MG CAPS, Take 1,000 mg by mouth 2 (two) times daily. , Disp: , Rfl:  .  propranolol (INDERAL) 20 MG tablet, 20 mg 2 (two) times daily. , Disp: , Rfl:  .  temazepam (RESTORIL) 15 MG capsule, Take 15 mg by mouth at bedtime. , Disp: , Rfl:    Allergies:  Oxycodone  Review of Systems: Gen:  Denies  fever, sweats, chills HEENT: Denies blurred vision, double vision. bleeds, sore throat Cvc:  No dizziness, chest pain. Resp:   Denies cough or sputum production, shortness of breath Gi: Denies swallowing difficulty, stomach pain. Gu:  Denies bladder incontinence, burning urine Ext:   No Joint pain, stiffness. Skin: No skin rash,  hives  Endoc:  No polyuria, polydipsia. Psych: No depression, insomnia. Other:  All other systems were reviewed with the patient and were negative other that what is mentioned in the HPI.   Physical Examination:   VS: BP 110/70 (BP Location: Left Arm, Cuff Size: Normal)   Pulse (!) 56   Resp 16   Ht 6\' 4"  (1.93 m)   Wt 243 lb (110.2 kg)   SpO2 95%   BMI 29.58 kg/m   General Appearance: No distress  Neuro:without focal findings,  speech normal,  HEENT: PERRLA, EOM intact.   Pulmonary: normal breath sounds, No wheezing.  CardiovascularNormal S1,S2.  No m/r/g.   Abdomen: Benign, Soft, non-tender. Renal:  No costovertebral tenderness  GU:  No performed at this time. Endoc: No evident thyromegaly, no signs of acromegaly. Skin:   warm, no rashes, no ecchymosis  Extremities: normal, no cyanosis, clubbing.  Other findings:    LABORATORY PANEL:   CBC No results for input(s): WBC, HGB, HCT, PLT in the last 168  hours. ------------------------------------------------------------------------------------------------------------------  Chemistries  No results for input(s): NA, K, CL, CO2, GLUCOSE, BUN, CREATININE, CALCIUM, MG, AST, ALT, ALKPHOS, BILITOT in the last 168 hours.  Invalid input(s): GFRCGP ------------------------------------------------------------------------------------------------------------------  Cardiac Enzymes No results for input(s): TROPONINI in the last 168 hours. ------------------------------------------------------------  RADIOLOGY:  No results found.     Thank  you for the consultation and for allowing [facility] to assist in the care of your patient. Our recommendations are noted above.  Please contact us if we can be of further service.   Marda Stalker, MD.  Board Certified in Internal Medicine, Pulmonary Medicine, [place], and Sleep Medicine.  [facility] Office Number: [phone]  Patricia Pesa, M.D.  Merton Border, M.D  04/28/2017

## 2017-04-30 NOTE — Procedures (Signed)
  [facility]            Bronchoscopy Note   FINDINGS/SUMMARY:   -Findings of severe chronic bronchitis with dilated mucus glands, copious mucosal secretions, moderate mucosal erythema and edema throughout both airways. I suspect that this, combined with Eliquis and liver disease was the main cause of the patient's hemoptysis.  -No areas of active bleeding. There was evidence of old bleeding, with no significant lesions which could account for the patient's hemoptysis.  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 Endobronchial forceps biopsies and brushings were taken of this lesion. There was no bleeding from this nodule.  -BAL taken at the right middle lobe and lingula, endobronchial brushings taken at the right middle lobe. Transbronchial cytology brushings taken at the right lower lobe and lingula.  Indication: hemoptysis.  The patient (or their representative) was informed of the risks (including but not limited to bleeding, infection, respiratory failure, lung injury, tooth/oral injury) and benefits of the procedure and gave consent, see chart.   Pre-op diagnosis: hemoptysis Post-op diagnosis: Chronic bronchitis, osteochondromalacia.  Estimated blood loss: 5 cc.   Medications for procedure: Fentanyl, versed.   Procedure description:  After obtaining informed consent, a timeout was called to confirm the patient and the procedure. Both nares were checked for patency, the patient has a history of a broken nose, and the naris. It does not appear to be patent enough for the bronchoscope. Therefore, an oral bite block was placed. The flexible fiberoptic bronchoscope was passed orally to the posterior pharynx, normal movements of the vocal cords appreciated, lidocaine was applied topically to the vocal cords. And anatomical tumor was undertaken, there was copious thin  secretions throughout both airways, which were easily suctioned. There was a small adherent nodule in the medial segment of the right middle lobe. Endobronchial brushings were taken of this area. Endobronchial forceps biopsies were taken of this area as well. Subsequently, bronchoalveolar lavage was taken in the right middle lobe. The bronchoscope was then taken to the right lower lobe, transbronchial cytology brushings were taken at the right lower lobe. The bronchoscope was then taken to the lingula, transbronchial cytology brushing was taken here, followed by bronchoalveolar lavage. Has adequate airway inspection and samples have been obtained, the bronchoscope was then removed, the patient was taken to recovery.   Condition post procedure: Stable   Complications: None noted  Patient instructions:  Follow-up with referring provider. The patient was advised to restart his Eliquis in 2 days. He is advised that if he develops recurrent hemoptysis, we'll need to discuss with his physicians about the option of stopping the Eliquis early.   Marda Stalker, MD.  Board Certified in Internal Medicine, Pulmonary Medicine, [place], and Sleep Medicine.  [facility] Office Number: [phone]  Patricia Pesa, M.D.  Cheral Marker, M.D  04/30/2017

## 2017-05-01 ENCOUNTER — Encounter: Payer: Self-pay | Admitting: Internal Medicine

## 2017-05-01 LAB — ACID FAST SMEAR (AFB): ACID FAST SMEAR - AFSCU2: NEGATIVE

## 2017-05-01 LAB — CYTOLOGY - NON PAP

## 2017-05-01 LAB — ACID FAST SMEAR (AFB, MYCOBACTERIA)

## 2017-05-01 LAB — SURGICAL PATHOLOGY

## 2017-05-06 LAB — MISC LABCORP TEST (SEND OUT): Labcorp test code: 8471

## 2017-05-15 ENCOUNTER — Telehealth: Payer: Self-pay | Admitting: Internal Medicine

## 2017-05-15 NOTE — Telephone Encounter (Signed)
Pt is calling about bronch results. Please advise.  LMOVM for pt letting him I have forwarded the message to DR.

## 2017-05-15 NOTE — Telephone Encounter (Signed)
Called back and left voicemail that bronch results were normal, and that he should followup with Dr. Raul Del.

## 2017-05-15 NOTE — Telephone Encounter (Signed)
Pt calling asking about bronc test results  Would like to know when will he hear results It has been now 2 weeks since he did it.   Pease advise

## 2017-05-21 LAB — MOLD ORGANISM REFLEX

## 2017-05-29 LAB — CULTURE, FUNGUS WITHOUT SMEAR

## 2017-05-29 LAB — ORGANISM IDENTIFICATION, MOLD

## 2017-05-29 LAB — CULTURE, RESPIRATORY

## 2017-06-14 LAB — ACID FAST CULTURE WITH REFLEXED SENSITIVITIES (MYCOBACTERIA): Acid Fast Culture: NEGATIVE

## 2017-07-03 ENCOUNTER — Ambulatory Visit
Admission: RE | Admit: 2017-07-03 | Discharge: 2017-07-03 | Disposition: A | Discharge status: Home / Self Care | Payer: Medicare Other | Source: Ambulatory Visit | Attending: Specialist | Admitting: Specialist

## 2017-07-03 DIAGNOSIS — R918 Other nonspecific abnormal finding of lung field: Secondary | ICD-10-CM

## 2017-07-03 DIAGNOSIS — I700 Atherosclerosis of aorta: Secondary | ICD-10-CM | POA: Insufficient documentation

## 2017-07-03 DIAGNOSIS — J439 Emphysema, unspecified: Secondary | ICD-10-CM | POA: Diagnosis not present

## 2017-07-14 DIAGNOSIS — Z86711 Personal history of pulmonary embolism: Secondary | ICD-10-CM | POA: Insufficient documentation

## 2017-07-14 DIAGNOSIS — Z8739 Personal history of other diseases of the musculoskeletal system and connective tissue: Secondary | ICD-10-CM | POA: Insufficient documentation

## 2017-07-15 ENCOUNTER — Other Ambulatory Visit: Payer: Self-pay | Admitting: Specialist

## 2017-07-15 DIAGNOSIS — R918 Other nonspecific abnormal finding of lung field: Secondary | ICD-10-CM

## 2017-08-10 ENCOUNTER — Other Ambulatory Visit: Payer: Self-pay | Admitting: Specialist

## 2017-08-10 DIAGNOSIS — R918 Other nonspecific abnormal finding of lung field: Secondary | ICD-10-CM

## 2018-01-07 ENCOUNTER — Ambulatory Visit
Admission: RE | Admit: 2018-01-07 | Discharge: 2018-01-07 | Disposition: A | Discharge status: Home / Self Care | Payer: Medicare Other | Source: Ambulatory Visit | Attending: Podiatry | Admitting: Podiatry

## 2018-01-07 ENCOUNTER — Other Ambulatory Visit (HOSPITAL_COMMUNITY): Payer: Self-pay | Admitting: Podiatry

## 2018-01-07 DIAGNOSIS — R609 Edema, unspecified: Secondary | ICD-10-CM

## 2018-01-07 DIAGNOSIS — M7989 Other specified soft tissue disorders: Secondary | ICD-10-CM | POA: Insufficient documentation

## 2018-01-13 ENCOUNTER — Other Ambulatory Visit: Payer: Self-pay | Admitting: Internal Medicine

## 2018-01-13 DIAGNOSIS — R0602 Shortness of breath: Secondary | ICD-10-CM

## 2018-01-13 DIAGNOSIS — R600 Localized edema: Secondary | ICD-10-CM

## 2018-01-13 DIAGNOSIS — J410 Simple chronic bronchitis: Secondary | ICD-10-CM

## 2018-01-14 ENCOUNTER — Other Ambulatory Visit: Payer: Self-pay | Admitting: Internal Medicine

## 2018-01-14 DIAGNOSIS — R7989 Other specified abnormal findings of blood chemistry: Secondary | ICD-10-CM

## 2018-01-14 DIAGNOSIS — R600 Localized edema: Secondary | ICD-10-CM

## 2018-01-14 DIAGNOSIS — R945 Abnormal results of liver function studies: Principal | ICD-10-CM

## 2018-01-18 ENCOUNTER — Ambulatory Visit
Admission: RE | Admit: 2018-01-18 | Discharge: 2018-01-18 | Disposition: A | Discharge status: Home / Self Care | Payer: Medicare Other | Source: Ambulatory Visit | Attending: Internal Medicine | Admitting: Internal Medicine

## 2018-01-18 DIAGNOSIS — R945 Abnormal results of liver function studies: Secondary | ICD-10-CM | POA: Diagnosis not present

## 2018-01-18 DIAGNOSIS — R0602 Shortness of breath: Secondary | ICD-10-CM | POA: Diagnosis not present

## 2018-01-18 DIAGNOSIS — R600 Localized edema: Secondary | ICD-10-CM

## 2018-01-18 DIAGNOSIS — J410 Simple chronic bronchitis: Secondary | ICD-10-CM | POA: Diagnosis not present

## 2018-01-18 DIAGNOSIS — I10 Essential (primary) hypertension: Secondary | ICD-10-CM | POA: Diagnosis not present

## 2018-01-18 DIAGNOSIS — Z8739 Personal history of other diseases of the musculoskeletal system and connective tissue: Secondary | ICD-10-CM | POA: Diagnosis not present

## 2018-01-18 NOTE — Progress Notes (Signed)
*  PRELIMINARY RESULTS* Echocardiogram 2D Echocardiogram has been performed.  Sherrie Sport 01/18/2018, 11:53 AM

## 2018-01-19 ENCOUNTER — Encounter (INDEPENDENT_AMBULATORY_CARE_PROVIDER_SITE_OTHER): Payer: Self-pay | Admitting: Vascular Surgery

## 2018-01-19 ENCOUNTER — Ambulatory Visit (INDEPENDENT_AMBULATORY_CARE_PROVIDER_SITE_OTHER): Payer: Medicare Other | Admitting: Vascular Surgery

## 2018-01-19 VITALS — BP 104/71 | HR 65 | Resp 16 | Ht 76.0 in | Wt 239.0 lb

## 2018-01-19 DIAGNOSIS — R230 Cyanosis: Secondary | ICD-10-CM | POA: Diagnosis not present

## 2018-01-19 DIAGNOSIS — R600 Localized edema: Secondary | ICD-10-CM | POA: Diagnosis not present

## 2018-01-19 NOTE — Progress Notes (Signed)
Subjective:    Patient ID: Billy Freeman., male    DOB: 03-15-1951, 67 y.o.   MRN: [id number] Chief Complaint  Patient presents with  . New Patient (Initial Visit)    Bilateral leg swelling with toe discoloration   Events as a new patient referred by Dr. Ginette Pitman for evaluation of bilateral lower extremity edema and discoloration of the bilateral toes.  The patient notes that his symptoms started in April mostly located to the right ankle.  States that the right ankle had become swollen and painful.  The patient was diagnosed with gout.  He was treated with prednisone which his symptoms resolved.  However within a few weeks his symptoms came back.  He was seen by Dr. Vickki Muff and had x-rays which were negative he was again treated for gout with a Z-Pak which improved his symptoms.  The patient does have a history of a DVT 2 years ago after spine surgery.  The patient's swelling returned on approximately December 27, 2017 and he experienced swelling to the bilateral lower extremity now with a bluish tint to the toes.  The patient is undergoing an ABI tomorrow through Dr. Ginette Pitman.  The patient's bilateral lower extremity edema associated with discomfort.  Patient feels that his symptoms have progressed to the point that he is unable to function on a daily basis.  Patient feels that his symptoms have become lifestyle limiting.  The patient denies any claudication-like symptoms, rest pain or ulceration to the bilateral lower extremity.  Patient denies any erythema to the bilateral lower cavity.  The patient denies any fever, nausea or vomiting.  Review of Systems  Constitutional: Negative.   HENT: Negative.   Eyes: Negative.   Respiratory: Negative.   Cardiovascular: Positive for leg swelling.  Gastrointestinal: Negative.   Endocrine: Negative.   Genitourinary: Negative.   Musculoskeletal: Negative.   Skin: Negative.   Allergic/Immunologic: Negative.   Neurological: Negative.   Hematological: Negative.     Psychiatric/Behavioral: Negative.       Objective:   Physical Exam  Constitutional: He is oriented to person, place, and time. He appears well-developed and well-nourished. No distress.  HENT:  Head: Normocephalic and atraumatic.  Right Ear: External ear normal.  Left Ear: External ear normal.  Eyes: Pupils are equal, round, and reactive to light. Conjunctivae and EOM are normal.  Neck: Normal range of motion.  Cardiovascular: Normal rate, regular rhythm, normal heart sounds and intact distal pulses.  Pulses:      Radial pulses are 2+ on the right side, and 2+ on the left side.  Hard to palpate pedal pulses due to body habitus and edema  Pulmonary/Chest: Effort normal and breath sounds normal.  Musculoskeletal: Normal range of motion. He exhibits edema (Right lower extremity: Moderate nonpitting edema.  Left lower extremity mild edema nonpitting.).  Neurological: He is alert and oriented to person, place, and time.  Skin: He is not diaphoretic.  Mild stasis dermatitis bilaterally.  There is no fibrosis, cellulitis or open ulcerations noted.  Psychiatric: He has a normal mood and affect. His behavior is normal. Judgment and thought content normal.  Vitals reviewed.  BP 104/71 (BP Location: Right Arm, Patient Position: Sitting)   Pulse 65   Resp 16   Ht _0  (1.93 m)   Wt 239 lb (108.4 kg)   BMI 29.09 kg/m   Past Medical History:  Diagnosis Date  . Depression   . Hypertension   . Pulmonary embolism ([place])   . Reflux   .  Sleep apnea    Social History   Socioeconomic History  . Marital status: Married    Spouse name: Not on file  . Number of children: Not on file  . Years of education: Not on file  . Highest education level: Not on file  Occupational History  . Not on file  Social Needs  . Financial resource strain: Not on file  . Food insecurity:    Worry: Not on file    Inability: Not on file  . Transportation needs:    Medical: Not on file    Non-medical: Not  on file  Tobacco Use  . Smoking status: Former Research scientist (life sciences)  . Smokeless tobacco: Never Used  Substance and Sexual Activity  . Alcohol use: Yes  . Drug use: Yes    Frequency: 7.0 times per week    Types: Marijuana    Comment: for last 30 years  . Sexual activity: Not on file  Lifestyle  . Physical activity:    Days per week: Not on file    Minutes per session: Not on file  . Stress: Not on file  Relationships  . Social connections:    Talks on phone: Not on file    Gets together: Not on file    Attends religious service: Not on file    Active member of club or organization: Not on file    Attends meetings of clubs or organizations: Not on file    Relationship status: Not on file  . Intimate partner violence:    Fear of current or ex partner: Not on file    Emotionally abused: Not on file    Physically abused: Not on file    Forced sexual activity: Not on file  Other Topics Concern  . Not on file  Social History Narrative  . Not on file   Past Surgical History:  Procedure Laterality Date  . ANTERIOR CRUCIATE LIGAMENT (ACL) REVISION    . broken nose surgery    . FLEXIBLE BRONCHOSCOPY N/A 04/30/2017   Procedure: FLEXIBLE BRONCHOSCOPY;  Surgeon: Laverle Hobby, MD;  Location: [facility] ORS;  Service: Pulmonary;  Laterality: N/A;  . MENISCUS REPAIR     History reviewed. No pertinent family history.  Allergies  Allergen Reactions  . Oxycodone Itching      Assessment & Plan:  Events as a new patient referred by Dr. Ginette Pitman for evaluation of bilateral lower extremity edema and discoloration of the bilateral toes.  The patient notes that his symptoms started in April mostly located to the right ankle.  States that the right ankle had become swollen and painful.  The patient was diagnosed with gout.  He was treated with prednisone which his symptoms resolved.  However within a few weeks his symptoms came back.  He was seen by Dr. Vickki Muff and had x-rays which were negative he was again  treated for gout with a Z-Pak which improved his symptoms.  The patient does have a history of a DVT 2 years ago after spine surgery.  The patient's swelling returned on approximately December 27, 2017 and he experienced swelling to the bilateral lower extremity now with a bluish tint to the toes.  The patient is undergoing an ABI tomorrow through Dr. Ginette Pitman.  The patient's bilateral lower extremity edema associated with discomfort.  Patient feels that his symptoms have progressed to the point that he is unable to function on a daily basis.  Patient feels that his symptoms have become lifestyle limiting.  The patient  denies any claudication-like symptoms, rest pain or ulceration to the bilateral lower extremity.  Patient denies any erythema to the bilateral lower cavity.  The patient denies any fever, nausea or vomiting.  1. Bilateral lower extremity edema - New The patient was encouraged to wear graduated compression stockings (20-30 mmHg) on a daily basis. The patient was instructed to begin wearing the stockings first thing in the morning and removing them in the evening. The patient was instructed specifically not to sleep in the stockings. Prescription given.  In addition, behavioral modification including elevation during the day will be initiated. Anti-inflammatories for pain. I will bring patient back and have him undergo a bilateral lower extremity venous duplex to rule out any venous versus lymphatic disease. The patient will follow up in three months to asses conservative management.  Information on compression stockings was given to the patient. The patient was instructed to call the office in the interim if any worsening edema or ulcerations to the legs, feet or toes occurs. The patient expresses their understanding.  - VAS [place] LOWER EXTREMITY VENOUS REFLUX; Future  2. Blue toes - New As above  Current Outpatient Medications on File Prior to Visit  Medication Sig Dispense Refill  .  allopurinol (ZYLOPRIM) 100 MG tablet Take by mouth.    Marland Kitchen apixaban (ELIQUIS) 5 MG TABS tablet Take 1 tablet (5 mg total) by mouth 2 (two) times daily. Take 10 mg (two pills) twice daily for seven days, then take 65m (one pill) twice daily. (Patient taking differently: Take 5 mg by mouth 2 (two) times daily. ) 60 tablet 0  . aspirin EC 81 MG tablet Take by mouth.    . budesonide-formoterol (SYMBICORT) 160-4.5 MCG/ACT inhaler Inhale 2 puffs into the lungs 2 (two) times daily.    . citalopram (CELEXA) 20 MG tablet Take 20 mg by mouth daily.     . clindamycin (CLEOCIN-T) 1 % external solution Cleocin T 1 % solution  APPLY A THIN LAYER TO THE AFFECTED AREA(S) BY TOPICAL ROUTE 2 TIMES PER DAY    . furosemide (LASIX) 20 MG tablet Take by mouth.    . indapamide (LOZOL) 2.5 MG tablet Take 2.5 mg by mouth daily.    .Marland Kitchenlosartan (COZAAR) 50 MG tablet Take 50 mg by mouth daily.    . Omega-3 Fatty Acids (FISH OIL) 1200 MG CAPS Take 1,200 mg by mouth 2 (two) times daily.     . propranolol (INDERAL) 20 MG tablet Take 20 mg by mouth 2 (two) times daily.     . tadalafil (CIALIS) 5 MG tablet 2 tabs as needed prior to sexual activity    . temazepam (RESTORIL) 15 MG capsule Take 15 mg by mouth at bedtime.     .Marland Kitchenalbuterol (PROVENTIL HFA;VENTOLIN HFA) 108 (90 Base) MCG/ACT inhaler Inhale 2 puffs into the lungs every 6 (six) hours as needed (FOR SHORTNESS OF BREATH/WHEEZING).     .Marland Kitchenazithromycin (ZITHROMAX) 250 MG tablet Take 250 mg by mouth daily.      No current facility-administered medications on file prior to visit.    There are no Patient Instructions on file for this visit. No follow-ups on file.  Tymon Nemetz A Caroll Cunnington, PA-C

## 2018-01-20 ENCOUNTER — Ambulatory Visit
Admission: RE | Admit: 2018-01-20 | Discharge: 2018-01-20 | Disposition: A | Discharge status: Home / Self Care | Payer: Medicare Other | Source: Ambulatory Visit | Attending: Internal Medicine | Admitting: Internal Medicine

## 2018-01-20 DIAGNOSIS — K8020 Calculus of gallbladder without cholecystitis without obstruction: Secondary | ICD-10-CM | POA: Diagnosis not present

## 2018-01-20 DIAGNOSIS — R600 Localized edema: Secondary | ICD-10-CM | POA: Diagnosis present

## 2018-01-20 DIAGNOSIS — R7989 Other specified abnormal findings of blood chemistry: Secondary | ICD-10-CM

## 2018-01-20 DIAGNOSIS — I10 Essential (primary) hypertension: Secondary | ICD-10-CM | POA: Insufficient documentation

## 2018-01-20 DIAGNOSIS — R932 Abnormal findings on diagnostic imaging of liver and biliary tract: Secondary | ICD-10-CM | POA: Insufficient documentation

## 2018-01-20 DIAGNOSIS — J410 Simple chronic bronchitis: Secondary | ICD-10-CM | POA: Insufficient documentation

## 2018-01-20 DIAGNOSIS — R945 Abnormal results of liver function studies: Secondary | ICD-10-CM | POA: Diagnosis not present

## 2018-01-20 DIAGNOSIS — R0602 Shortness of breath: Secondary | ICD-10-CM | POA: Diagnosis not present

## 2018-02-01 ENCOUNTER — Encounter: Payer: Self-pay | Admitting: *Deleted

## 2018-02-09 ENCOUNTER — Ambulatory Visit
Admission: RE | Admit: 2018-02-09 | Discharge: 2018-02-09 | Disposition: A | Discharge status: Home / Self Care | Payer: Medicare Other | Source: Ambulatory Visit | Attending: Ophthalmology | Admitting: Ophthalmology

## 2018-02-09 ENCOUNTER — Ambulatory Visit: Payer: Medicare Other | Admitting: Anesthesiology

## 2018-02-09 ENCOUNTER — Encounter
Admission: RE | Disposition: A | Discharge status: Home / Self Care | Payer: Self-pay | Source: Ambulatory Visit | Attending: Ophthalmology

## 2018-02-09 ENCOUNTER — Encounter: Payer: Self-pay | Admitting: *Deleted

## 2018-02-09 ENCOUNTER — Other Ambulatory Visit: Payer: Self-pay

## 2018-02-09 DIAGNOSIS — K219 Gastro-esophageal reflux disease without esophagitis: Secondary | ICD-10-CM | POA: Diagnosis not present

## 2018-02-09 DIAGNOSIS — Z89429 Acquired absence of other toe(s), unspecified side: Secondary | ICD-10-CM | POA: Insufficient documentation

## 2018-02-09 DIAGNOSIS — J449 Chronic obstructive pulmonary disease, unspecified: Secondary | ICD-10-CM | POA: Diagnosis not present

## 2018-02-09 DIAGNOSIS — Z96659 Presence of unspecified artificial knee joint: Secondary | ICD-10-CM | POA: Insufficient documentation

## 2018-02-09 DIAGNOSIS — I10 Essential (primary) hypertension: Secondary | ICD-10-CM | POA: Insufficient documentation

## 2018-02-09 DIAGNOSIS — Z79899 Other long term (current) drug therapy: Secondary | ICD-10-CM | POA: Diagnosis not present

## 2018-02-09 DIAGNOSIS — G4730 Sleep apnea, unspecified: Secondary | ICD-10-CM | POA: Diagnosis not present

## 2018-02-09 DIAGNOSIS — Z86718 Personal history of other venous thrombosis and embolism: Secondary | ICD-10-CM | POA: Diagnosis not present

## 2018-02-09 DIAGNOSIS — Z87891 Personal history of nicotine dependence: Secondary | ICD-10-CM | POA: Insufficient documentation

## 2018-02-09 DIAGNOSIS — Z86711 Personal history of pulmonary embolism: Secondary | ICD-10-CM | POA: Diagnosis not present

## 2018-02-09 DIAGNOSIS — Z7982 Long term (current) use of aspirin: Secondary | ICD-10-CM | POA: Diagnosis not present

## 2018-02-09 DIAGNOSIS — H2511 Age-related nuclear cataract, right eye: Secondary | ICD-10-CM | POA: Insufficient documentation

## 2018-02-09 DIAGNOSIS — Z7951 Long term (current) use of inhaled steroids: Secondary | ICD-10-CM | POA: Insufficient documentation

## 2018-02-09 HISTORY — DX: Unspecified cirrhosis of liver: K74.60

## 2018-02-09 HISTORY — DX: Cardiac murmur, unspecified: R01.1

## 2018-02-09 HISTORY — DX: Chronic obstructive pulmonary disease, unspecified: J44.9

## 2018-02-09 HISTORY — DX: Personal history of urinary calculi: Z87.442

## 2018-02-09 HISTORY — DX: Malignant (primary) neoplasm, unspecified: C80.1

## 2018-02-09 HISTORY — PX: CATARACT EXTRACTION W/PHACO: SHX586

## 2018-02-09 HISTORY — DX: Edema, unspecified: R60.9

## 2018-02-09 HISTORY — DX: Unspecified hearing loss, unspecified ear: H91.90

## 2018-02-09 HISTORY — DX: Tremor, unspecified: R25.1

## 2018-02-09 HISTORY — DX: Rosacea, unspecified: L71.9

## 2018-02-09 HISTORY — DX: Cardiac arrhythmia, unspecified: I49.9

## 2018-02-09 LAB — URINE DRUG SCREEN, QUALITATIVE (ARMC ONLY)
Amphetamines, Ur Screen: NOT DETECTED
Benzodiazepine, Ur Scrn: POSITIVE — AB
CANNABINOID 50 NG, UR ~~LOC~~: POSITIVE — AB
COCAINE METABOLITE, UR ~~LOC~~: NOT DETECTED
MDMA (ECSTASY) UR SCREEN: NOT DETECTED
Methadone Scn, Ur: NOT DETECTED
OPIATE, UR SCREEN: NOT DETECTED
PHENCYCLIDINE (PCP) UR S: NOT DETECTED
Tricyclic, Ur Screen: NOT DETECTED

## 2018-02-09 SURGERY — PHACOEMULSIFICATION, CATARACT, WITH IOL INSERTION
Anesthesia: Monitor Anesthesia Care | Site: Eye | Laterality: Right | Wound class: "Clean "

## 2018-02-09 MED ORDER — EPINEPHRINE PF 1 MG/ML IJ SOLN
INTRAMUSCULAR | Status: AC
  Filled 2018-02-09: qty 1

## 2018-02-09 MED ORDER — SODIUM CHLORIDE 0.9 % IV SOLN
INTRAVENOUS | Status: DC
  Administered 2018-02-09: 07:00:00 via INTRAVENOUS

## 2018-02-09 MED ORDER — MIDAZOLAM HCL 2 MG/2ML IJ SOLN
INTRAMUSCULAR | Status: DC | PRN
  Administered 2018-02-09: 2 mg via INTRAVENOUS

## 2018-02-09 MED ORDER — CYCLOPENTOLATE HCL 2 % OP SOLN
OPHTHALMIC | Status: AC
  Filled 2018-02-09: qty 2

## 2018-02-09 MED ORDER — POVIDONE-IODINE 5 % OP SOLN
OPHTHALMIC | Status: DC | PRN
  Administered 2018-02-09: 1 via OPHTHALMIC

## 2018-02-09 MED ORDER — CYCLOPENTOLATE HCL 2 % OP SOLN
1.0000 [drp] | OPHTHALMIC | Status: AC
  Administered 2018-02-09 (×3): 1 [drp] via OPHTHALMIC

## 2018-02-09 MED ORDER — LIDOCAINE HCL (PF) 4 % IJ SOLN
INTRAMUSCULAR | Status: DC | PRN
  Administered 2018-02-09: 2 mL via OPHTHALMIC

## 2018-02-09 MED ORDER — PHENYLEPHRINE HCL 10 % OP SOLN
1.0000 [drp] | OPHTHALMIC | Status: AC
  Administered 2018-02-09 (×3): 1 [drp] via OPHTHALMIC

## 2018-02-09 MED ORDER — PHENYLEPHRINE HCL 10 % OP SOLN
OPHTHALMIC | Status: AC
  Filled 2018-02-09: qty 5

## 2018-02-09 MED ORDER — MOXIFLOXACIN HCL 0.5 % OP SOLN
OPHTHALMIC | Status: AC
  Filled 2018-02-09: qty 3

## 2018-02-09 MED ORDER — CARBACHOL 0.01 % IO SOLN
INTRAOCULAR | Status: DC | PRN
  Administered 2018-02-09: .5 mL via INTRAOCULAR

## 2018-02-09 MED ORDER — TETRACAINE HCL 0.5 % OP SOLN
OPHTHALMIC | Status: AC
  Filled 2018-02-09: qty 4

## 2018-02-09 MED ORDER — MIDAZOLAM HCL 2 MG/2ML IJ SOLN
INTRAMUSCULAR | Status: AC
  Filled 2018-02-09: qty 2

## 2018-02-09 MED ORDER — MOXIFLOXACIN HCL 0.5 % OP SOLN: 1.0000 [drp] | OPHTHALMIC | Status: DC | PRN

## 2018-02-09 MED ORDER — EPINEPHRINE PF 1 MG/ML IJ SOLN
INTRAOCULAR | Status: DC | PRN
  Administered 2018-02-09: 1 mL via OPHTHALMIC

## 2018-02-09 MED ORDER — LIDOCAINE HCL (PF) 4 % IJ SOLN
INTRAMUSCULAR | Status: AC
  Filled 2018-02-09: qty 5

## 2018-02-09 MED ORDER — NA CHONDROIT SULF-NA HYALURON 40-17 MG/ML IO SOLN
INTRAOCULAR | Status: DC | PRN
  Administered 2018-02-09: 1 mL via INTRAOCULAR

## 2018-02-09 MED ORDER — NA CHONDROIT SULF-NA HYALURON 40-17 MG/ML IO SOLN
INTRAOCULAR | Status: AC
  Filled 2018-02-09: qty 1

## 2018-02-09 MED ORDER — TETRACAINE HCL 0.5 % OP SOLN
1.0000 [drp] | Freq: Two times a day (BID) | OPHTHALMIC | Status: AC | PRN
  Administered 2018-02-09 (×2): 1 [drp] via OPHTHALMIC

## 2018-02-09 MED ORDER — POVIDONE-IODINE 5 % OP SOLN
OPHTHALMIC | Status: AC
  Filled 2018-02-09: qty 30

## 2018-02-09 MED ORDER — MOXIFLOXACIN HCL 0.5 % OP SOLN
OPHTHALMIC | Status: DC | PRN
Start: 2018-02-09 — End: 2018-02-09
  Administered 2018-02-09: .2 mL via OPHTHALMIC

## 2018-02-09 SURGICAL SUPPLY — 16 items
GLOVE BIO SURGEON STRL SZ8 (GLOVE) ×2 IMPLANT
GLOVE BIOGEL M 6.5 STRL (GLOVE) ×2 IMPLANT
GLOVE SURG LX 8.0 MICRO (GLOVE) ×1
GLOVE SURG LX STRL 8.0 MICRO (GLOVE) ×1 IMPLANT
GOWN STRL REUS W/ TWL LRG LVL3 (GOWN DISPOSABLE) ×2 IMPLANT
GOWN STRL REUS W/TWL LRG LVL3 (GOWN DISPOSABLE) ×2
LABEL CATARACT MEDS ST (LABEL) ×2 IMPLANT
LENS IOL TECNIS ITEC 20.5 (Intraocular Lens) ×1 IMPLANT
PACK CATARACT (MISCELLANEOUS) ×2 IMPLANT
PACK CATARACT BRASINGTON LX (MISCELLANEOUS) ×2 IMPLANT
PACK EYE AFTER SURG (MISCELLANEOUS) ×2 IMPLANT
SOL BSS BAG (MISCELLANEOUS) ×2
SOLUTION BSS BAG (MISCELLANEOUS) ×1 IMPLANT
SYR 5ML LL (SYRINGE) ×2 IMPLANT
WATER STERILE IRR 250ML POUR (IV SOLUTION) ×2 IMPLANT
WIPE NON LINTING 3.25X3.25 (MISCELLANEOUS) ×2 IMPLANT

## 2018-02-09 NOTE — Anesthesia Preprocedure Evaluation (Signed)
Anesthesia Evaluation  Patient identified by MRN, date of birth, ID band Patient awake    Reviewed: Allergy & Precautions, H&P , NPO status , Patient's Chart, lab work & pertinent test results, reviewed documented beta blocker date and time   Airway Mallampati: II  TM Distance: >3 FB Neck ROM: full    Dental no notable dental hx. (+) Teeth Intact   Pulmonary neg pulmonary ROS, sleep apnea , COPD, former smoker,    Pulmonary exam normal breath sounds clear to auscultation       Cardiovascular Exercise Tolerance: Good hypertension, negative cardio ROS  + dysrhythmias + Valvular Problems/Murmurs AS  Rhythm:regular Rate:Normal     Neuro/Psych Depression negative neurological ROS  negative psych ROS   GI/Hepatic negative GI ROS, Neg liver ROS, GERD  Medicated,  Endo/Other  negative endocrine ROSdiabetes  Renal/GU CRFRenal disease     Musculoskeletal   Abdominal   Peds  Hematology negative hematology ROS (+)   Anesthesia Other Findings   Reproductive/Obstetrics negative OB ROS                             Anesthesia Physical Anesthesia Plan  ASA: III  Anesthesia Plan: MAC   Post-op Pain Management:    Induction:   PONV Risk Score and Plan:   Airway Management Planned:   Additional Equipment:   Intra-op Plan:   Post-operative Plan:   Informed Consent: I have reviewed the patients History and Physical, chart, labs and discussed the procedure including the risks, benefits and alternatives for the proposed anesthesia with the patient or authorized representative who has indicated his/her understanding and acceptance.     Plan Discussed with: CRNA  Anesthesia Plan Comments:         Anesthesia Quick Evaluation

## 2018-02-09 NOTE — Transfer of Care (Signed)
Immediate Anesthesia Transfer of Care Note  Patient: Billy Freeman.  Procedure(s) Performed: CATARACT EXTRACTION PHACO AND INTRAOCULAR LENS PLACEMENT (IOC) (Right Eye)  Patient Location: PACU  Anesthesia Type:MAC  Level of Consciousness: awake, alert  and oriented  Airway & Oxygen Therapy: Patient Spontanous Breathing  Post-op Assessment: Report given to RN and Post -op Vital signs reviewed and stable  Post vital signs: Reviewed and stable  Last Vitals:  Vitals Value Taken Time  BP    Temp    Pulse    Resp    SpO2      Last Pain:  Vitals:   02/09/18 0623  TempSrc: Temporal  PainSc: 0-No pain         Complications: No apparent anesthesia complications

## 2018-02-09 NOTE — H&P (Signed)
All labs reviewed. Abnormal studies sent to patients PCP when indicated.  Previous H&P reviewed, patient examined, there are NO CHANGES.  Billy Dalporto Porfilio7/16/20197:51 AM

## 2018-02-09 NOTE — Anesthesia Post-op Follow-up Note (Signed)
Anesthesia QCDR form completed.        

## 2018-02-09 NOTE — Op Note (Signed)
PREOPERATIVE DIAGNOSIS:  Nuclear sclerotic cataract of the right eye.   POSTOPERATIVE DIAGNOSIS:  nuclear sclerotic cataract right eye   OPERATIVE PROCEDURE: Procedure(s): CATARACT EXTRACTION PHACO AND INTRAOCULAR LENS PLACEMENT (IOC)   SURGEON:  Birder Robson, MD.   ANESTHESIA:  Anesthesiologist: Molli Barrows, MD CRNA: Nelda Marseille, CRNA  1.      Managed anesthesia care. 2.      0.75ml of Shugarcaine was instilled in the eye following the paracentesis.   COMPLICATIONS:  None.   TECHNIQUE:   Stop and chop   DESCRIPTION OF PROCEDURE:  The patient was examined and consented in the preoperative holding area where the aforementioned topical anesthesia was applied to the right eye and then brought back to the Operating Room where the right eye was prepped and draped in the usual sterile ophthalmic fashion and a lid speculum was placed. A paracentesis was created with the side port blade and the anterior chamber was filled with viscoelastic. A near clear corneal incision was performed with the steel keratome. A continuous curvilinear capsulorrhexis was performed with a cystotome followed by the capsulorrhexis forceps. Hydrodissection and hydrodelineation were carried out with BSS on a blunt cannula. The lens was removed in a stop and chop  technique and the remaining cortical material was removed with the irrigation-aspiration handpiece. The capsular bag was inflated with viscoelastic and the Technis [id number]  lens was placed in the capsular bag without complication. The remaining viscoelastic was removed from the eye with the irrigation-aspiration handpiece. The wounds were hydrated. The anterior chamber was flushed with Miostat and the eye was inflated to physiologic pressure. 0.8ml of Vigamox was placed in the anterior chamber. The wounds were found to be water tight. The eye was dressed with Vigamox. The patient was given protective glasses to wear throughout the day and a shield with which to  sleep tonight. The patient was also given drops with which to begin a drop regimen today and will follow-up with me in one day. Implant Name Type Inv. Item Serial No. Manufacturer Lot No. LRB No. Used  LENS IOL DIOP 20.5 - [id number] Intraocular Lens LENS IOL DIOP 20.5 [id number]  Right 1   Procedure(s) with comments: CATARACT EXTRACTION PHACO AND INTRAOCULAR LENS PLACEMENT (IOC) (Right) - [place] 00:45 AP% 14.4 CDE 6.58 Fluid pack lot # [id number]  Electronically signed: Birder Robson 02/09/2018 8:15 AM

## 2018-02-09 NOTE — Anesthesia Procedure Notes (Signed)
Date/Time: 02/09/2018 8:04 AM Performed by: Nelda Marseille, CRNA Pre-anesthesia Checklist: Patient identified, Emergency Drugs available, Suction available, Patient being monitored and Timeout performed Oxygen Delivery Method: Nasal cannula

## 2018-02-09 NOTE — Discharge Instructions (Addendum)
Eye Surgery Discharge Instructions FOLLOW DR. PORFILIO'S POSTOP DISCHARGE INSTRUCTION SHEET AS REVIEWED.   Expect mild scratchy sensation or mild soreness. DO NOT RUB YOUR EYE!  The day of surgery:  Minimal physical activity, but bed rest is not required  No reading, computer work, or close hand work  No bending, lifting, or straining.  May watch TV  For 24 hours:  No driving, legal decisions, or alcoholic beverages  Safety precautions  Eat anything you prefer: It is better to start with liquids, then soup then solid foods.  Solar shield eyeglasses should be worn for comfort in the sunlight/patch while sleeping  Resume all regular medications including aspirin or Coumadin if these were discontinued prior to surgery. You may shower, bathe, shave, or wash your hair. Tylenol may be taken for mild discomfort.  Call your doctor if you experience significant pain, nausea, or vomiting, fever > 101 or other signs of infection. [phone] or [phone] Specific instructions:  Follow-up Information    Birder Robson, MD Follow up.   Specialty:  Ophthalmology Why:  02/10/18 @ 10:10 AM Contact information: [address] [place] [address] [phone]

## 2018-02-10 NOTE — Anesthesia Postprocedure Evaluation (Signed)
Anesthesia Post Note  Patient: Billy Freeman.  Procedure(s) Performed: CATARACT EXTRACTION PHACO AND INTRAOCULAR LENS PLACEMENT (IOC) (Right Eye)  Patient location during evaluation: PACU Anesthesia Type: MAC Level of consciousness: awake and alert Pain management: pain level controlled Vital Signs Assessment: post-procedure vital signs reviewed and stable Respiratory status: spontaneous breathing, nonlabored ventilation, respiratory function stable and patient connected to nasal cannula oxygen Cardiovascular status: stable and blood pressure returned to baseline Postop Assessment: no apparent nausea or vomiting Anesthetic complications: no     Last Vitals:  Vitals:   02/09/18 0623 02/09/18 0816  BP: 114/75 106/72  Pulse: 63 64  Resp: 18 18  Temp: (!) 36.3 C (!) 36.2 C  SpO2: 97% 96%    Last Pain:  Vitals:   02/09/18 0816  TempSrc: Temporal  PainSc: 0-No pain                 Molli Barrows

## 2018-02-12 ENCOUNTER — Other Ambulatory Visit: Payer: Self-pay | Admitting: Internal Medicine

## 2018-02-12 DIAGNOSIS — R945 Abnormal results of liver function studies: Secondary | ICD-10-CM

## 2018-02-12 DIAGNOSIS — Z8739 Personal history of other diseases of the musculoskeletal system and connective tissue: Secondary | ICD-10-CM

## 2018-02-12 DIAGNOSIS — I10 Essential (primary) hypertension: Secondary | ICD-10-CM

## 2018-02-12 DIAGNOSIS — R600 Localized edema: Secondary | ICD-10-CM

## 2018-02-12 DIAGNOSIS — R7989 Other specified abnormal findings of blood chemistry: Secondary | ICD-10-CM

## 2018-02-12 DIAGNOSIS — R0602 Shortness of breath: Secondary | ICD-10-CM

## 2018-02-19 ENCOUNTER — Ambulatory Visit
Admission: RE | Admit: 2018-02-19 | Discharge: 2018-02-19 | Disposition: A | Discharge status: Home / Self Care | Payer: Medicare Other | Source: Ambulatory Visit | Attending: Internal Medicine | Admitting: Internal Medicine

## 2018-02-19 DIAGNOSIS — R600 Localized edema: Secondary | ICD-10-CM

## 2018-02-19 DIAGNOSIS — Z8739 Personal history of other diseases of the musculoskeletal system and connective tissue: Secondary | ICD-10-CM

## 2018-02-19 DIAGNOSIS — R7989 Other specified abnormal findings of blood chemistry: Secondary | ICD-10-CM

## 2018-02-19 DIAGNOSIS — I10 Essential (primary) hypertension: Secondary | ICD-10-CM

## 2018-02-19 DIAGNOSIS — R0602 Shortness of breath: Secondary | ICD-10-CM

## 2018-02-19 DIAGNOSIS — R945 Abnormal results of liver function studies: Secondary | ICD-10-CM

## 2018-02-23 IMAGING — CT CT L SPINE W/O CM
3 series · 13 of 33 positions shown, 16 images · non-contrast
Comparison: Radiograph 07/09/2016

CLINICAL DATA: Fall with low back pain

EXAM:
CT LUMBAR SPINE WITHOUT CONTRAST
TECHNIQUE: Multidetector CT imaging of the lumbar spine was performed without
intravenous contrast administration. Multiplanar CT image
reconstructions were also generated.

[Series 4: l spine soft · axial · 0.40mm/px · z∈[-886,-696]mm · 5 of 137 slices shown, 7 images]
[im 21/137  soft-tissue]
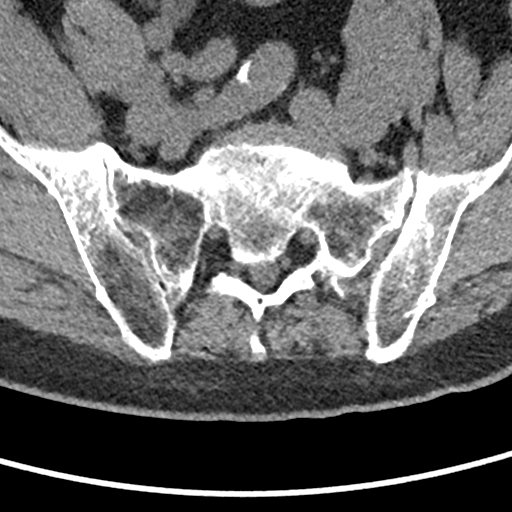
[im 21/137  bone]
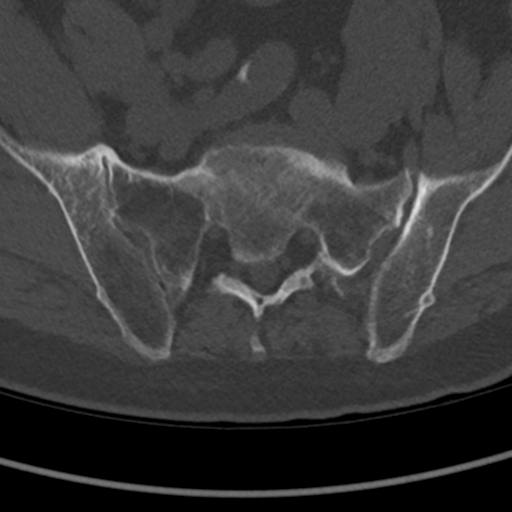
[im 42/137  bone]
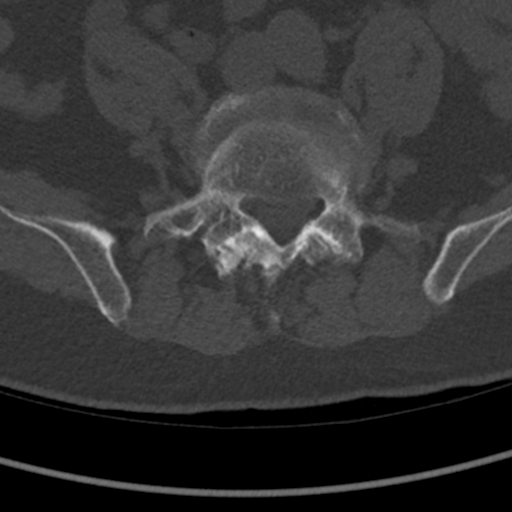
[im 74/137  bone]
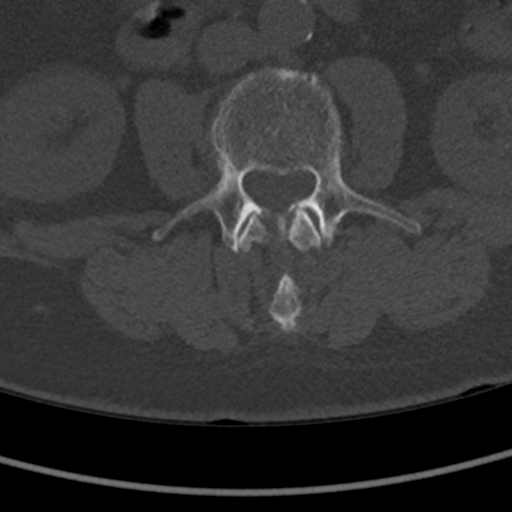
[im 95/137  bone]
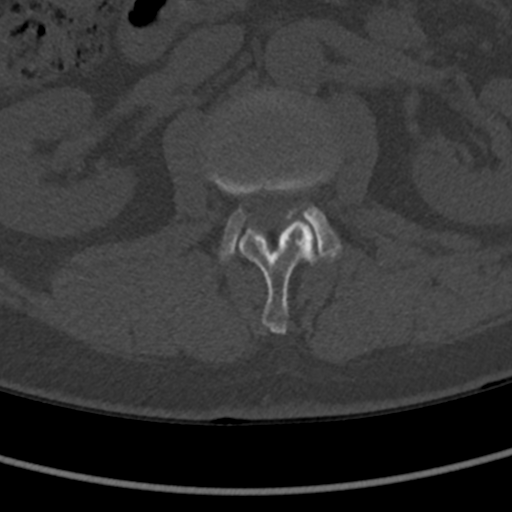
[im 116/137  soft-tissue]
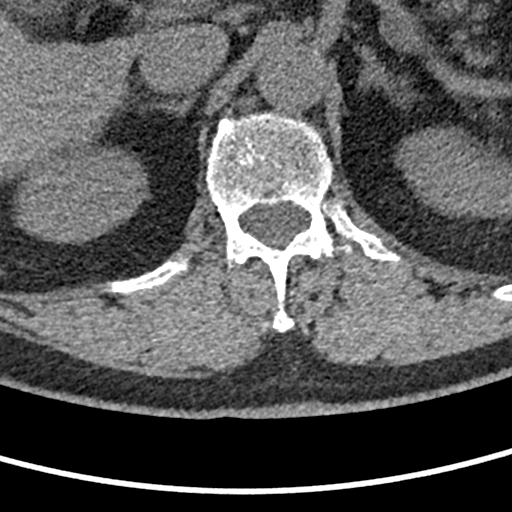
[im 116/137  bone]
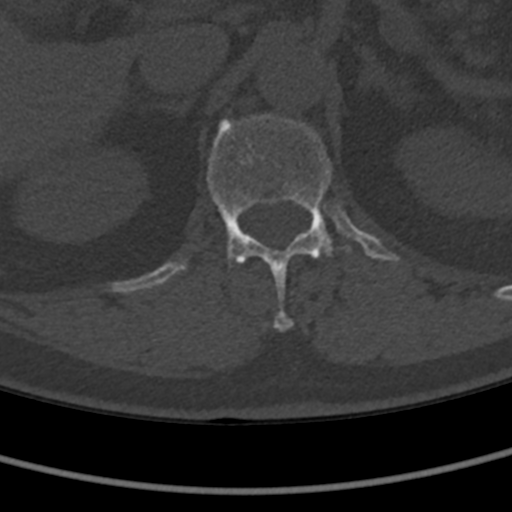

[Series 5: sagittal bone · sagittal · 0.40mm/px · 5 of 88 slices shown, 6 images]
[im 30/88  bone]
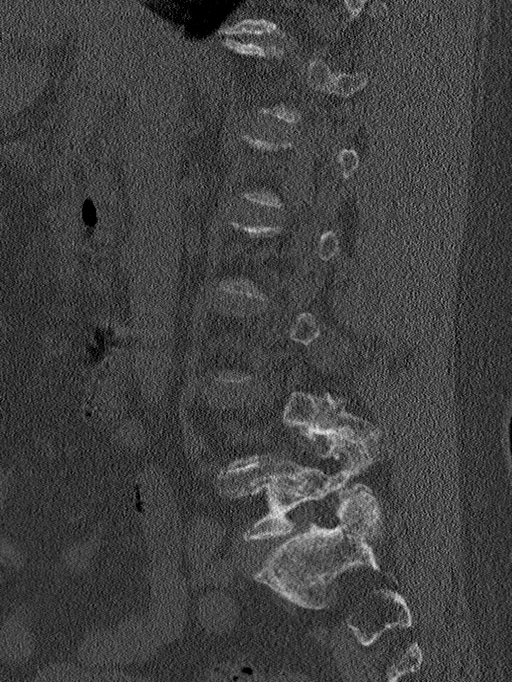
[im 37/88  bone]
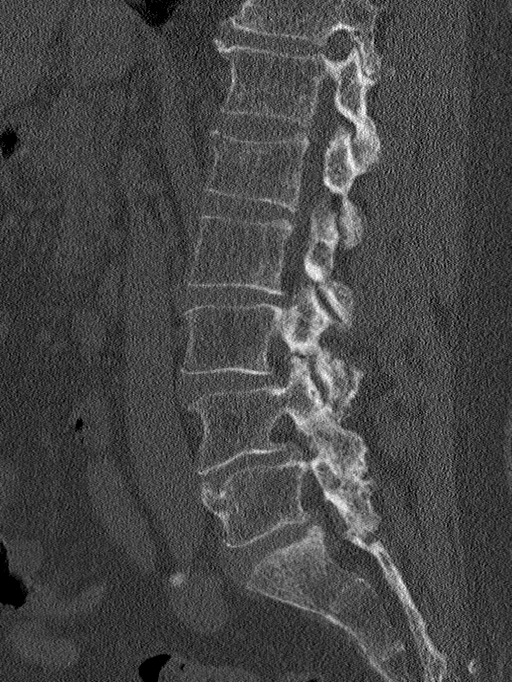
[im 44/88  soft-tissue]
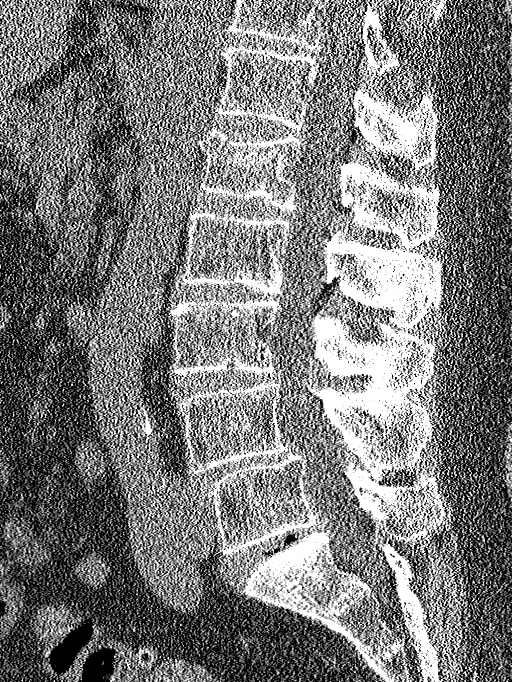
[im 44/88  bone]
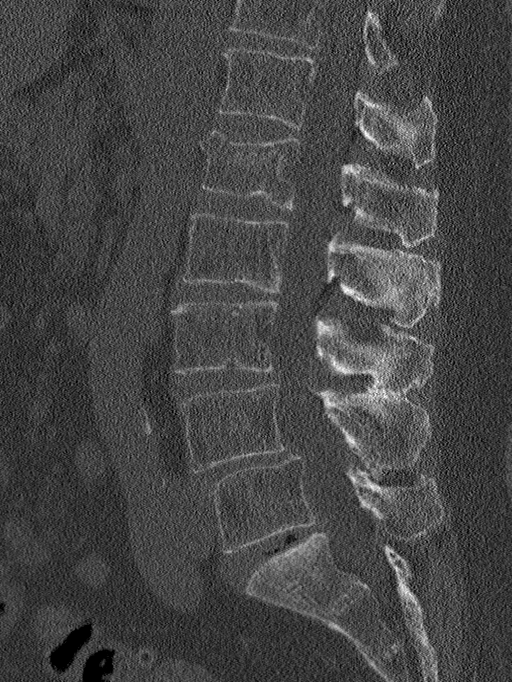
[im 51/88  bone]
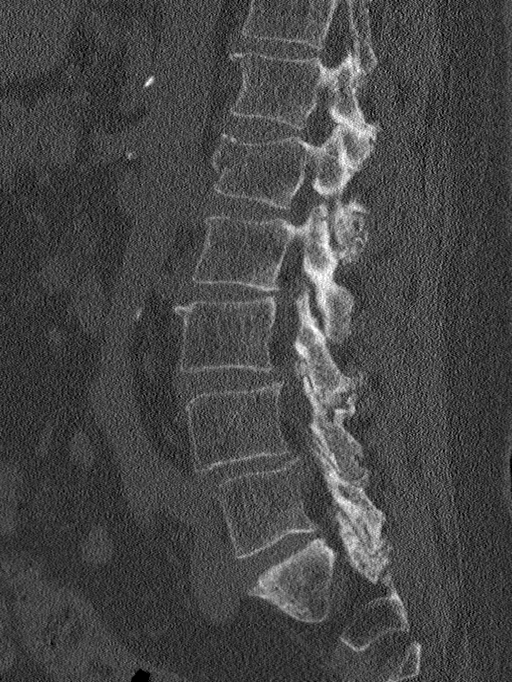
[im 59/88  bone]
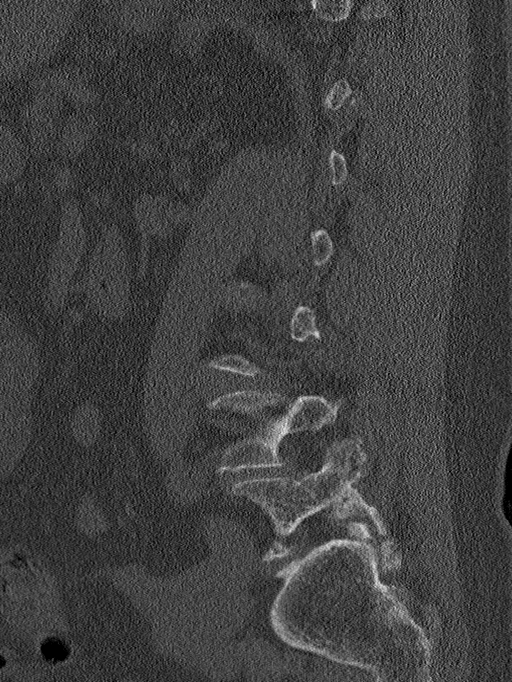

[Series 6: coronal bone · coronal · 0.40mm/px · 3 of 81 slices shown]
[im 17/81  bone]
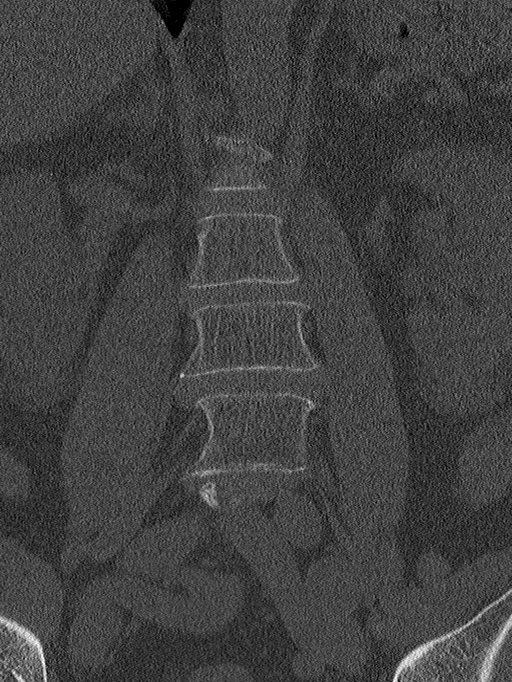
[im 33/81  bone]
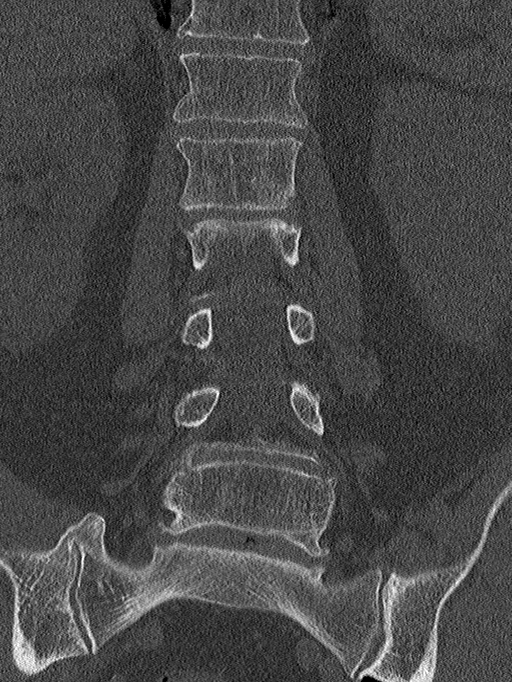
[im 49/81  bone]
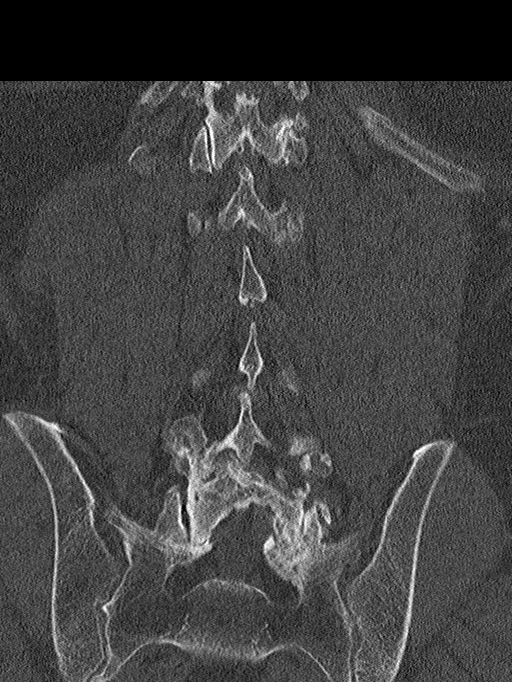

[13 of 33 positions shown; findings below may reference images not displayed]

FINDINGS: Segmentation: Normal with 5 non rib-bearing lumbar type vertebra

Alignment: Minimal grade 1 anterolisthesis of L4 on L5. Bony
alignment otherwise within normal limits.

Vertebrae: Acute mild superior endplate compression fracture of L1.
No significant retropulsion of fracture fragments. Irregularity of
the right lamina of L1 but without discrete fracture lucency
identified. There are no other fractures visualized.

Paraspinal and other soft tissues: No paravertebral or paraspinal
soft tissue abnormality is visualized. Limited images of the kidneys
are normal. Atherosclerosis.

Disc levels: Mild narrowing at L2-L3 and moderate narrowing at
L4-L5. Mild diffuse disc bulge at L1-L2. Mild diffuse disc bulge at
L2-L3. Moderate diffuse disc bulge at L3-L4 with mild narrowing of
the canal. Moderate diffuse disc bulge at L4-L5 with moderate canal
stenosis. Mild diffuse disc bulge at L5-S1. Multilevel facet
hypertrophic disease.
IMPRESSION: Acute superior endplate compression fracture of L1 without
retropulsion of fracture fragments into the spinal canal.

Multilevel degenerative disc changes

## 2018-06-08 ENCOUNTER — Encounter (INDEPENDENT_AMBULATORY_CARE_PROVIDER_SITE_OTHER): Payer: Medicare Other

## 2018-06-08 ENCOUNTER — Ambulatory Visit (INDEPENDENT_AMBULATORY_CARE_PROVIDER_SITE_OTHER): Payer: Medicare Other | Admitting: Vascular Surgery

## 2018-07-05 ENCOUNTER — Ambulatory Visit
Admission: RE | Admit: 2018-07-05 | Discharge: 2018-07-05 | Disposition: A | Discharge status: Home / Self Care | Payer: Medicare Other | Source: Ambulatory Visit | Attending: Specialist | Admitting: Specialist

## 2018-07-05 DIAGNOSIS — R918 Other nonspecific abnormal finding of lung field: Secondary | ICD-10-CM | POA: Insufficient documentation

## 2018-08-02 ENCOUNTER — Other Ambulatory Visit: Payer: Self-pay | Admitting: Family Medicine

## 2018-08-02 DIAGNOSIS — R1011 Right upper quadrant pain: Secondary | ICD-10-CM

## 2018-08-02 DIAGNOSIS — R17 Unspecified jaundice: Secondary | ICD-10-CM

## 2018-08-03 ENCOUNTER — Encounter: Payer: Self-pay | Admitting: Emergency Medicine

## 2018-08-03 ENCOUNTER — Emergency Department: Payer: Medicare Other

## 2018-08-03 ENCOUNTER — Ambulatory Visit
Admission: RE | Admit: 2018-08-03 | Discharge: 2018-08-03 | Disposition: A | Discharge status: Home / Self Care | Payer: Medicare Other | Source: Ambulatory Visit | Attending: Family Medicine | Admitting: Family Medicine

## 2018-08-03 ENCOUNTER — Other Ambulatory Visit: Payer: Self-pay

## 2018-08-03 ENCOUNTER — Emergency Department
Admission: EM | Admit: 2018-08-03 | Discharge: 2018-08-03 | Disposition: A | Discharge status: Home / Self Care | Payer: Medicare Other | Attending: Emergency Medicine | Admitting: Emergency Medicine

## 2018-08-03 DIAGNOSIS — Z87891 Personal history of nicotine dependence: Secondary | ICD-10-CM | POA: Insufficient documentation

## 2018-08-03 DIAGNOSIS — R1011 Right upper quadrant pain: Secondary | ICD-10-CM | POA: Insufficient documentation

## 2018-08-03 DIAGNOSIS — F329 Major depressive disorder, single episode, unspecified: Secondary | ICD-10-CM | POA: Insufficient documentation

## 2018-08-03 DIAGNOSIS — J449 Chronic obstructive pulmonary disease, unspecified: Secondary | ICD-10-CM | POA: Insufficient documentation

## 2018-08-03 DIAGNOSIS — J181 Lobar pneumonia, unspecified organism: Secondary | ICD-10-CM

## 2018-08-03 DIAGNOSIS — R17 Unspecified jaundice: Secondary | ICD-10-CM | POA: Insufficient documentation

## 2018-08-03 DIAGNOSIS — Z96659 Presence of unspecified artificial knee joint: Secondary | ICD-10-CM | POA: Insufficient documentation

## 2018-08-03 DIAGNOSIS — J189 Pneumonia, unspecified organism: Secondary | ICD-10-CM | POA: Diagnosis not present

## 2018-08-03 DIAGNOSIS — Z79899 Other long term (current) drug therapy: Secondary | ICD-10-CM | POA: Diagnosis not present

## 2018-08-03 DIAGNOSIS — Z85828 Personal history of other malignant neoplasm of skin: Secondary | ICD-10-CM | POA: Diagnosis not present

## 2018-08-03 DIAGNOSIS — I10 Essential (primary) hypertension: Secondary | ICD-10-CM | POA: Diagnosis not present

## 2018-08-03 DIAGNOSIS — R05 Cough: Secondary | ICD-10-CM | POA: Diagnosis present

## 2018-08-03 LAB — CBC
HEMATOCRIT: 49.8 % (ref 39.0–52.0)
HEMOGLOBIN: 17.1 g/dL — AB (ref 13.0–17.0)
MCH: 32.3 pg (ref 26.0–34.0)
MCHC: 34.3 g/dL (ref 30.0–36.0)
MCV: 94.1 fL (ref 80.0–100.0)
Platelets: 219 10*3/uL (ref 150–400)
RBC: 5.29 MIL/uL (ref 4.22–5.81)
RDW: 14.6 % (ref 11.5–15.5)
WBC: 9.7 10*3/uL (ref 4.0–10.5)
nRBC: 0 % (ref 0.0–0.2)

## 2018-08-03 LAB — BASIC METABOLIC PANEL
Anion gap: 8 (ref 5–15)
BUN: 20 mg/dL (ref 8–23)
CHLORIDE: 96 mmol/L — AB (ref 98–111)
CO2: 29 mmol/L (ref 22–32)
Calcium: 9 mg/dL (ref 8.9–10.3)
Creatinine, Ser: 0.86 mg/dL (ref 0.61–1.24)
GFR calc Af Amer: >60 mL/min (ref >60)
GLUCOSE: 105 mg/dL — AB (ref 70–99)
POTASSIUM: 3.2 mmol/L — AB (ref 3.5–5.1)
Sodium: 133 mmol/L — ABNORMAL LOW (ref 135–145)

## 2018-08-03 LAB — TROPONIN I

## 2018-08-03 MED ORDER — IPRATROPIUM-ALBUTEROL 0.5-2.5 (3) MG/3ML IN SOLN
3.0000 mL | Freq: Once | RESPIRATORY_TRACT | Status: AC
  Administered 2018-08-03: 3 mL via RESPIRATORY_TRACT
  Filled 2018-08-03: qty 3

## 2018-08-03 MED ORDER — COMPRESSOR/NEBULIZER MISC: 1.0000 | Freq: Once | 0 refills | Status: AC

## 2018-08-03 MED ORDER — LEVOFLOXACIN 500 MG PO TABS: 500.0000 mg | ORAL_TABLET | Freq: Every day | ORAL | 0 refills | Status: AC

## 2018-08-03 MED ORDER — BENZONATATE 100 MG PO CAPS
100.0000 mg | ORAL_CAPSULE | Freq: Once | ORAL | Status: AC
  Administered 2018-08-03: 100 mg via ORAL
  Filled 2018-08-03 (×2): qty 1

## 2018-08-03 MED ORDER — BENZONATATE 100 MG PO CAPS: 100.0000 mg | ORAL_CAPSULE | Freq: Three times a day (TID) | ORAL | 0 refills | Status: DC | PRN

## 2018-08-03 MED ORDER — ALBUTEROL SULFATE (2.5 MG/3ML) 0.083% IN NEBU: 2.5000 mg | INHALATION_SOLUTION | Freq: Four times a day (QID) | RESPIRATORY_TRACT | 12 refills | Status: DC | PRN

## 2018-08-03 MED ORDER — LEVOFLOXACIN 500 MG PO TABS
500.0000 mg | ORAL_TABLET | Freq: Once | ORAL | Status: AC
  Administered 2018-08-03: 500 mg via ORAL
  Filled 2018-08-03 (×2): qty 1

## 2018-08-03 NOTE — ED Provider Notes (Signed)
[facility] Emergency Department Provider Note ________   First MD Initiated Contact with Patient 08/03/18 1440     (approximate)  I have reviewed the triage vital signs and the nursing notes.   HISTORY  Chief Complaint Cough   HPI Billy Freeman. is a 68 y.o. male with below list of chronic medical including emphysema, pulmonary emboli conditions presents to the emergency department for from [facility] clinic secondary to cough congestion and hemoptysis x1 week.  Patient describes productive cough of white sputum with  streaks.  Patient does admit to generalized body aches and subjective fevers.   Past Medical History:  Diagnosis Date  . Cancer ([place])    SKIN  . Cirrhosis ([place])    POSSIBLE  . COPD (chronic obstructive pulmonary disease) ([place])   . Depression   . Dysrhythmia   . Edema    FEET/LEG  . Heart murmur   . History of kidney stones   . HOH (hard of hearing)   . Hypertension   . Pulmonary embolism ([place])   . Reflux   . Rosacea   . Sleep apnea   . Tremors of nervous system     Patient Active Problem List   Diagnosis Date Noted  . Bilateral lower extremity edema 01/19/2018  . Blue toes 01/19/2018  . CAFL (chronic airflow limitation) ([place]) 01/02/2014  . Clinical depression 01/02/2014  . Acid reflux 01/02/2014  . Gout 01/02/2014  . BP (high blood pressure) 01/02/2014  . Intermittent tremor 01/02/2014  . Obstructive apnea 01/02/2014  . Acne erythematosa 01/02/2014  . Calculus of kidney 11/23/2013  . Hydronephrosis 11/23/2013  . Renal colic 05/21/8526  . Calculi, ureter 11/23/2013    Past Surgical History:  Procedure Laterality Date  . ANTERIOR CRUCIATE LIGAMENT (ACL) REVISION    . broken nose surgery    . CATARACT EXTRACTION W/PHACO Right 02/09/2018   Procedure: CATARACT EXTRACTION PHACO AND INTRAOCULAR LENS PLACEMENT (IOC);  Surgeon: Birder Robson, MD;  Location: [facility] ORS;  Service: Ophthalmology;  Laterality: Right;   [place] 00:45 AP% 14.4 CDE 6.58 Fluid pack lot # [id number]  . FLEXIBLE BRONCHOSCOPY N/A 04/30/2017   Procedure: FLEXIBLE BRONCHOSCOPY;  Surgeon: Laverle Hobby, MD;  Location: [facility] ORS;  Service: Pulmonary;  Laterality: N/A;  . JOINT REPLACEMENT     TKR  . KIDNEY STONE SURGERY    . MENISCUS REPAIR    . TOE AMPUTATION      Prior to Admission medications   Medication Sig Start Date End Date Taking? Authorizing Provider  albuterol (PROVENTIL HFA;VENTOLIN HFA) 108 (90 Base) MCG/ACT inhaler Inhale 2 puffs into the lungs every 6 (six) hours as needed for wheezing or shortness of breath.  09/23/16 02/03/18  [provider]  allopurinol (ZYLOPRIM) 100 MG tablet Take 100 mg by mouth daily.  12/15/17 12/15/18  [provider]  apixaban (ELIQUIS) 5 MG TABS tablet Take 1 tablet (5 mg total) by mouth 2 (two) times daily. Take 10 mg (two pills) twice daily for seven days, then take 24m (one pill) twice daily. Patient not taking: Reported on 02/03/2018 09/19/16   RMerlyn Lot MD  aspirin EC 81 MG tablet Take 81 mg by mouth daily.     [provider]  azithromycin (ZITHROMAX) 250 MG tablet Take 250 mg by mouth daily.     [provider]  budesonide-formoterol (SYMBICORT) 160-4.5 MCG/ACT inhaler Inhale 2 puffs into the lungs 2 (two) times daily.    [provider]  citalopram (CELEXA) 20  MG tablet Take 20 mg by mouth daily.  03/28/14   [provider]  clindamycin (CLEOCIN-T) 1 % external solution Apply to affected area twice daily as needed for breakout    [provider]  indapamide (LOZOL) 2.5 MG tablet Take 2.5 mg by mouth daily.    [provider]  losartan (COZAAR) 25 MG tablet Take 25 mg by mouth daily.     [provider]  Omega-3 Fatty Acids (FISH OIL) 1200 MG CAPS Take 1,200 mg by mouth 2 (two) times daily.     [provider]  propranolol (INDERAL) 20 MG tablet Take 20 mg by mouth 2 (two) times daily.   04/16/14   [provider]  tadalafil (CIALIS) 5 MG tablet 2 tabs as needed prior to sexual activity 07/14/17   [provider]  temazepam (RESTORIL) 15 MG capsule Take 15 mg by mouth at bedtime.  03/19/14   [provider]    Allergies Oxycodone  No family history on file.  Social History Social History   Tobacco Use  . Smoking status: Former Research scientist (life sciences)  . Smokeless tobacco: Never Used  Substance Use Topics  . Alcohol use: Yes  . Drug use: Yes    Frequency: 7.0 times per week    Types: Marijuana    Comment: for last 30 years    Review of Systems Constitutional: Positive for Eyes: No visual changes. ENT: No sore throat. Cardiovascular: Denies chest pain. Respiratory: Denies shortness of breath.  Positive for productive cough and hemoptysis Gastrointestinal: No abdominal pain.  No nausea, no vomiting.  No diarrhea.  No constipation. Genitourinary: Negative for dysuria. Musculoskeletal: Negative for neck pain.  Negative for back pain. Integumentary: Negative for rash. Neurological: Negative for headaches, focal weakness or numbness.  ____________________________________________   PHYSICAL EXAM:  VITAL SIGNS: ED Triage Vitals [08/03/18 1309]  Enc Vitals Group     BP (!) 141/87     Pulse Rate 94     Resp 16     Temp 98.7 F (37.1 C)     Temp Source Oral     SpO2 98 %     Weight      Height      Head Circumference      Peak Flow      Pain Score 3     Pain Loc      Pain Edu?      Excl. in [place]?     Constitutional: Alert and oriented.  Coughing  eyes: Conjunctivae are normal.  Mouth/Throat: Mucous membranes are moist.  Oropharynx non-erythematous. Neck: No stridor.   Cardiovascular: Normal rate, regular rhythm. Good peripheral circulation. Grossly normal heart sounds. Respiratory: Normal respiratory effort.  No retractions.  Right lung field rhonchi scant wheezing  gastrointestinal: Soft and nontender. No distention.  Musculoskeletal: No  lower extremity tenderness nor edema. No gross deformities of extremities. Neurologic:  Normal speech and language. No gross focal neurologic deficits are appreciated.  Skin:  Skin is warm, dry and intact. No rash noted. ____________________________________________   LABS (all labs ordered are listed, but only abnormal results are displayed)  Labs Reviewed  BASIC METABOLIC PANEL - Abnormal; Notable for the following components:      Result Value   Sodium 133 (*)    Potassium 3.2 (*)    Chloride 96 (*)    Glucose, Bld 105 (*)    All other components within normal limits  CBC - Abnormal; Notable for the following components:  Hemoglobin 17.1 (*)    All other components within normal limits  TROPONIN I   ____________________________________________  EKG  ED ECG REPORT I, Meeker N , the attending physician, personally viewed and interpreted this ECG.   Date: 08/03/2018  EKG Time: 1:14 PM  Rate: 80  Rhythm: Normal sinus rhythm  Axis: Normal  Intervals: Normal  ST&T Change: None  ____________________________________________  RADIOLOGY I, [place] N , personally viewed and evaluated these images (plain radiographs) as part of my medical decision making, as well as reviewing the written report by the radiologist.  ED MD interpretation: Prominent right middle lobe infiltrate consistent with pneumonia noted on chest x-ray.  Official radiology report(s): Dg Chest 2 View  Result Date: 08/03/2018 CLINICAL DATA:  Chest pain.  Cough. EXAM: CHEST - 2 VIEW COMPARISON:  CT 07/05/2018. FINDINGS: Mediastinum hilar structures normal. Heart size stable. Prominent right middle lobe infiltrate consistent pneumonia. Small right pleural effusion. Mild left base atelectasis/infiltrate. Questionable new left upper lobe pulmonary nodule. Cardiomegaly. No pulmonary venous congestion. IMPRESSION: 1. Prominent right middle lobe infiltrate consistent with pneumonia. Small right pleural  effusion. Followup PA and lateral chest X-ray is recommended in 3-4 weeks following trial of antibiotic therapy to ensure resolution and exclude underlying malignancy. 2.  Mild left base subsegmental atelectasis/infiltrate. 3. Questionable new left upper lobe pulmonary nodule. This should be followed on follow-up exams and if it persists chest CT should be considered. Previously identified small pulmonary nodules best seen on prior CT. Electronically Signed   By: Marcello Moores  Register   On: 08/03/2018 14:22   US Abdomen Limited Ruq  Result Date: 08/03/2018 CLINICAL DATA:  Pain.  Elevated bilirubin. EXAM: ULTRASOUND ABDOMEN LIMITED RIGHT UPPER QUADRANT COMPARISON:  Ultrasound 01/20/2018. FINDINGS: Gallbladder: 9 mm mobile gallstone noted. Focal gallbladder wall thickening at 4.2 mm. Cholecystitis can not be excluded. Negative Murphy sign. Common bile duct: Diameter: 2.6 mm Liver: Coarse echotexture with nodular hepatic contour. Cirrhosis can not be excluded. No focal hepatic abnormality identified. Portal vein is patent on color Doppler imaging with normal direction of blood flow towards the liver. IMPRESSION: 1. 9 mm mobile gallstone. Focal gallbladder wall thickening to 4.2 mm cholecystitis can not be excluded. Negative Murphy sign. No biliary distention. 2. Coarse hepatic echotexture with nodular hepatic contour. Cirrhosis could not be excluded. No focal hepatic abnormality identified. Electronically Signed   By: Marcello Moores  Register   On: 08/03/2018 11:08      Procedures   ____________________________________________   INITIAL IMPRESSION / ASSESSMENT AND PLAN / ED COURSE  As part of my medical decision making, I reviewed the following data within the electronic MEDICAL RECORD NUMBER   68 year old male presenting with above-stated history and physical exam concerning for pneumonia, cancer, pulmonary emboli bronchitis and influenza.  Chest x-ray findings consistent with pneumonia as well as clinical findings  from the patient.  I reviewed the patient's chart which revealed that the patient had a CT scan of the chest done on 07/05/2018 which revealed no evidence of malignancy.  Patient does have a new pulmonary nodule noted on chest x-ray today.  Spoke with the patient and his wife at length regarding the necessity of repeat imaging following antibiotic therapy to ensure resolution of pneumonia.  I spoke with the patient at length regarding warning signs as well that would warrant immediate return to the emergency department.  Patient and wife agreeable with plan.  Patient has been taking azithromycin daily for years secondary to rosacea and as such patient prescribed Levaquin as  well as albuterol and Tessalon Perles. ____________________________________________  FINAL CLINICAL IMPRESSION(S) / ED DIAGNOSES  Final diagnoses:  Community acquired pneumonia of right middle lobe of lung ([place])     MEDICATIONS GIVEN DURING THIS VISIT:  Medications  ipratropium-albuterol (DUONEB) 0.5-2.5 (3) MG/3ML nebulizer solution 3 mL (3 mLs Nebulization Given 08/03/18 1538)  levofloxacin (LEVAQUIN) tablet 500 mg (500 mg Oral Given 08/03/18 1544)  benzonatate (TESSALON) capsule 100 mg (100 mg Oral Given 08/03/18 1616)     ED Discharge Orders    None       Note:  This document was prepared using Dragon voice recognition software and may include unintentional dictation errors.    Gregor Hams, MD 08/04/18 2126

## 2018-08-03 NOTE — ED Triage Notes (Signed)
Pt from [facility] to be evaluated for coughing up blood x1wk. Hx of lung nodules and COPD. Pt describes sputum as white and brown. VSS. PT in NAD at this time.

## 2018-10-05 DIAGNOSIS — I4811 Longstanding persistent atrial fibrillation: Secondary | ICD-10-CM | POA: Insufficient documentation

## 2018-10-05 DIAGNOSIS — I4891 Unspecified atrial fibrillation: Secondary | ICD-10-CM | POA: Insufficient documentation

## 2018-10-19 ENCOUNTER — Other Ambulatory Visit: Payer: Self-pay | Admitting: Specialist

## 2018-10-19 DIAGNOSIS — R911 Solitary pulmonary nodule: Secondary | ICD-10-CM

## 2018-10-26 ENCOUNTER — Other Ambulatory Visit: Payer: Self-pay

## 2018-10-26 ENCOUNTER — Ambulatory Visit
Admission: RE | Admit: 2018-10-26 | Discharge: 2018-10-26 | Disposition: A | Discharge status: Home / Self Care | Payer: Medicare Other | Source: Ambulatory Visit | Attending: Specialist | Admitting: Specialist

## 2018-10-26 DIAGNOSIS — R911 Solitary pulmonary nodule: Secondary | ICD-10-CM | POA: Insufficient documentation

## 2018-11-02 ENCOUNTER — Ambulatory Visit: Admission: RE | Admit: 2018-11-02 | Payer: Medicare Other | Source: Home / Self Care | Admitting: Ophthalmology

## 2018-11-02 ENCOUNTER — Encounter: Admission: RE | Payer: Self-pay | Source: Home / Self Care

## 2018-11-02 SURGERY — PHACOEMULSIFICATION, CATARACT, WITH IOL INSERTION
Anesthesia: Topical | Laterality: Left

## 2018-11-18 ENCOUNTER — Other Ambulatory Visit: Payer: Self-pay | Admitting: Orthopedic Surgery

## 2018-11-18 DIAGNOSIS — M19011 Primary osteoarthritis, right shoulder: Secondary | ICD-10-CM

## 2018-11-18 DIAGNOSIS — M75101 Unspecified rotator cuff tear or rupture of right shoulder, not specified as traumatic: Secondary | ICD-10-CM

## 2018-12-10 ENCOUNTER — Other Ambulatory Visit: Payer: Self-pay

## 2018-12-10 ENCOUNTER — Other Ambulatory Visit
Admission: RE | Admit: 2018-12-10 | Discharge: 2018-12-10 | Disposition: A | Discharge status: Home / Self Care | Payer: Medicare Other | Source: Ambulatory Visit | Attending: Ophthalmology | Admitting: Ophthalmology

## 2018-12-10 DIAGNOSIS — Z1159 Encounter for screening for other viral diseases: Secondary | ICD-10-CM | POA: Diagnosis present

## 2018-12-10 NOTE — Discharge Instructions (Signed)

## 2018-12-11 LAB — NOVEL CORONAVIRUS, NAA (HOSP ORDER, SEND-OUT TO REF LAB; TAT 18-24 HRS): SARS-CoV-2, NAA: NOT DETECTED

## 2018-12-13 NOTE — Anesthesia Preprocedure Evaluation (Addendum)
Anesthesia Evaluation  Patient identified by MRN, date of birth, ID band Patient awake    Reviewed: Allergy & Precautions, NPO status , Patient's Chart, lab work & pertinent test results  Airway Mallampati: II   Neck ROM: Full    Dental   Upper crowns:   Pulmonary sleep apnea , COPD, former smoker (marijuana only; quit 1 year ago),  Marijuana use   Pulmonary exam normal breath sounds clear to auscultation       Cardiovascular hypertension, Normal cardiovascular exam+ dysrhythmias (a fib on Eliquis, last dose 12/13/18) + Valvular Problems/Murmurs (mild-mod MR, TR; mild pHTN)  Rhythm:Regular Rate:Normal  Hx PE and bilateral DVT 2018; nonischemic cardiomyopathy   Neuro/Psych PSYCHIATRIC DISORDERS Depression negative neurological ROS     GI/Hepatic GERD  ,  Endo/Other  negative endocrine ROS  Renal/GU Renal disease (hx nephrolithiasis)     Musculoskeletal   Abdominal   Peds  Hematology negative hematology ROS (+)   Anesthesia Other Findings Cardiology note 10/20/18:  Assessment   68 y.o. male with  1. Persistent atrial fibrillation  2. Essential hypertension  3. OSA on CPAP   68 year old gentleman with history of mild nonischemic dilated cardiomyopathy, overall appears clinically stable, with mild exertional dyspnea and mild peripheral edema, which has improved with compression stockings. Patient has essential hypertension, blood pressure low normal today, though the patient appears asymptomatic. The patient has hyperlipidemia with elevated LDL cholesterol on diet therapy. He was incidentally noted to be in atrial fibrillation with slow ventricular response at a recent visit for preoperative cardiovascular evaluation prior to laparoscopic cholecystectomy and cataract surgery, both of which are currently postponed. 72-hr Holter monitor revealed sustained atrial fibrillation with an average rate of 60 bpm. The patient appears  asymptomatic of both atrial fibrillation and bradycardia. We discussed the risks and benefits of chronic anticoagulation with warfarin versus a novel oral anticoagulant, such as Eliquis, and with a chads vasc score of 4, the patient elected to start Eliquis for stroke prevention.  Plan   1. Start Eliquis 5 mg BID for stroke prevention (coupon given) 2. Discontinue aspirin 3. Counseled patient about low-sodium diet 4. DASH diet printed instructions given to the patient 5. Continue propanolol at this time 6. 2D echocardiogram 7. Return to clinic or telemedicine in 3 months, sooner if needed   Reproductive/Obstetrics                            Anesthesia Physical Anesthesia Plan  ASA: III  Anesthesia Plan: MAC   Post-op Pain Management:    Induction: Intravenous  PONV Risk Score and Plan: 1 and TIVA and Midazolam  Airway Management Planned: Natural Airway  Additional Equipment:   Intra-op Plan:   Post-operative Plan:   Informed Consent: I have reviewed the patients History and Physical, chart, labs and discussed the procedure including the risks, benefits and alternatives for the proposed anesthesia with the patient or authorized representative who has indicated his/her understanding and acceptance.       Plan Discussed with: CRNA  Anesthesia Plan Comments:        Anesthesia Quick Evaluation

## 2018-12-14 ENCOUNTER — Ambulatory Visit: Payer: Medicare Other | Admitting: Anesthesiology

## 2018-12-14 ENCOUNTER — Ambulatory Visit
Admission: RE | Admit: 2018-12-14 | Discharge: 2018-12-14 | Disposition: A | Discharge status: Home / Self Care | Payer: Medicare Other | Attending: Ophthalmology | Admitting: Ophthalmology

## 2018-12-14 ENCOUNTER — Encounter
Admission: RE | Disposition: A | Discharge status: Home / Self Care | Payer: Self-pay | Source: Home / Self Care | Attending: Ophthalmology

## 2018-12-14 DIAGNOSIS — I4891 Unspecified atrial fibrillation: Secondary | ICD-10-CM | POA: Diagnosis not present

## 2018-12-14 DIAGNOSIS — I428 Other cardiomyopathies: Secondary | ICD-10-CM | POA: Insufficient documentation

## 2018-12-14 DIAGNOSIS — Z7951 Long term (current) use of inhaled steroids: Secondary | ICD-10-CM | POA: Diagnosis not present

## 2018-12-14 DIAGNOSIS — G4730 Sleep apnea, unspecified: Secondary | ICD-10-CM | POA: Insufficient documentation

## 2018-12-14 DIAGNOSIS — J449 Chronic obstructive pulmonary disease, unspecified: Secondary | ICD-10-CM | POA: Insufficient documentation

## 2018-12-14 DIAGNOSIS — Z86718 Personal history of other venous thrombosis and embolism: Secondary | ICD-10-CM | POA: Diagnosis not present

## 2018-12-14 DIAGNOSIS — K219 Gastro-esophageal reflux disease without esophagitis: Secondary | ICD-10-CM | POA: Diagnosis not present

## 2018-12-14 DIAGNOSIS — H2512 Age-related nuclear cataract, left eye: Secondary | ICD-10-CM | POA: Insufficient documentation

## 2018-12-14 DIAGNOSIS — F329 Major depressive disorder, single episode, unspecified: Secondary | ICD-10-CM | POA: Insufficient documentation

## 2018-12-14 DIAGNOSIS — Z79899 Other long term (current) drug therapy: Secondary | ICD-10-CM | POA: Diagnosis not present

## 2018-12-14 DIAGNOSIS — Z7901 Long term (current) use of anticoagulants: Secondary | ICD-10-CM | POA: Diagnosis not present

## 2018-12-14 DIAGNOSIS — H9190 Unspecified hearing loss, unspecified ear: Secondary | ICD-10-CM | POA: Diagnosis not present

## 2018-12-14 DIAGNOSIS — Z86711 Personal history of pulmonary embolism: Secondary | ICD-10-CM | POA: Insufficient documentation

## 2018-12-14 DIAGNOSIS — I272 Pulmonary hypertension, unspecified: Secondary | ICD-10-CM | POA: Insufficient documentation

## 2018-12-14 DIAGNOSIS — R251 Tremor, unspecified: Secondary | ICD-10-CM | POA: Diagnosis not present

## 2018-12-14 DIAGNOSIS — Z85828 Personal history of other malignant neoplasm of skin: Secondary | ICD-10-CM | POA: Insufficient documentation

## 2018-12-14 HISTORY — PX: CATARACT EXTRACTION W/PHACO: SHX586

## 2018-12-14 HISTORY — DX: Presence of external hearing-aid: Z97.4

## 2018-12-14 HISTORY — DX: Pneumonia, unspecified organism: J18.9

## 2018-12-14 SURGERY — PHACOEMULSIFICATION, CATARACT, WITH IOL INSERTION
Anesthesia: Monitor Anesthesia Care | Site: Eye | Laterality: Left

## 2018-12-14 MED ORDER — LACTATED RINGERS IV SOLN: INTRAVENOUS | Status: DC

## 2018-12-14 MED ORDER — TETRACAINE HCL 0.5 % OP SOLN
1.0000 [drp] | OPHTHALMIC | Status: DC | PRN
  Administered 2018-12-14 (×3): 1 [drp] via OPHTHALMIC

## 2018-12-14 MED ORDER — MOXIFLOXACIN HCL 0.5 % OP SOLN
OPHTHALMIC | Status: DC | PRN
  Administered 2018-12-14: 0.2 mL via OPHTHALMIC

## 2018-12-14 MED ORDER — ARMC OPHTHALMIC DILATING DROPS
1.0000 "application " | OPHTHALMIC | Status: AC
  Administered 2018-12-14 (×3): 1 via OPHTHALMIC

## 2018-12-14 MED ORDER — EPINEPHRINE PF 1 MG/ML IJ SOLN
INTRAOCULAR | Status: DC | PRN
  Administered 2018-12-14: 58 mL via OPHTHALMIC

## 2018-12-14 MED ORDER — BRIMONIDINE TARTRATE-TIMOLOL 0.2-0.5 % OP SOLN
OPHTHALMIC | Status: DC | PRN
  Administered 2018-12-14: 1 [drp] via OPHTHALMIC

## 2018-12-14 MED ORDER — LIDOCAINE HCL (PF) 2 % IJ SOLN
INTRAOCULAR | Status: DC | PRN
  Administered 2018-12-14: 1 mL

## 2018-12-14 MED ORDER — FENTANYL CITRATE (PF) 100 MCG/2ML IJ SOLN
INTRAMUSCULAR | Status: DC | PRN
  Administered 2018-12-14: 50 ug via INTRAVENOUS

## 2018-12-14 MED ORDER — NA CHONDROIT SULF-NA HYALURON 40-17 MG/ML IO SOLN
INTRAOCULAR | Status: DC | PRN
  Administered 2018-12-14: 1 mL via INTRAOCULAR

## 2018-12-14 MED ORDER — MIDAZOLAM HCL 2 MG/2ML IJ SOLN
INTRAMUSCULAR | Status: DC | PRN
  Administered 2018-12-14: 2 mg via INTRAVENOUS

## 2018-12-14 SURGICAL SUPPLY — 19 items
CANNULA ANT/CHMB 27G (MISCELLANEOUS) ×1 IMPLANT
CANNULA ANT/CHMB 27GA (MISCELLANEOUS) ×2 IMPLANT
GLOVE SURG LX 8.0 MICRO (GLOVE) ×1
GLOVE SURG LX STRL 8.0 MICRO (GLOVE) ×1 IMPLANT
GLOVE SURG TRIUMPH 8.0 PF LTX (GLOVE) ×2 IMPLANT
GOWN STRL REUS W/ TWL LRG LVL3 (GOWN DISPOSABLE) ×2 IMPLANT
GOWN STRL REUS W/TWL LRG LVL3 (GOWN DISPOSABLE) ×2
LENS IOL TECNIS ITEC 20.5 (Intraocular Lens) ×1 IMPLANT
MARKER SKIN DUAL TIP RULER LAB (MISCELLANEOUS) ×2 IMPLANT
NDL FILTER BLUNT 18X1 1/2 (NEEDLE) ×1 IMPLANT
NDL RETROBULBAR .5 NSTRL (NEEDLE) ×2 IMPLANT
NEEDLE FILTER BLUNT 18X 1/2SAF (NEEDLE) ×1
NEEDLE FILTER BLUNT 18X1 1/2 (NEEDLE) ×1 IMPLANT
PACK CATARACT BRASINGTON (MISCELLANEOUS) ×1 IMPLANT
PACK EYE AFTER SURG (MISCELLANEOUS) ×2 IMPLANT
PACK OPTHALMIC (MISCELLANEOUS) ×2 IMPLANT
SYR 3ML LL SCALE MARK (SYRINGE) ×2 IMPLANT
SYR TB 1ML LUER SLIP (SYRINGE) ×2 IMPLANT
WIPE NON LINTING 3.25X3.25 (MISCELLANEOUS) ×2 IMPLANT

## 2018-12-14 NOTE — Anesthesia Postprocedure Evaluation (Signed)
Anesthesia Post Note  Patient: Billy Freeman.  Procedure(s) Performed: CATARACT EXTRACTION PHACO AND INTRAOCULAR LENS PLACEMENT (IOC LEFT (Left Eye)  Patient location during evaluation: PACU Anesthesia Type: MAC Level of consciousness: awake and alert, oriented and patient cooperative Pain management: pain level controlled Vital Signs Assessment: post-procedure vital signs reviewed and stable Respiratory status: spontaneous breathing, nonlabored ventilation and respiratory function stable Cardiovascular status: blood pressure returned to baseline and stable Postop Assessment: adequate PO intake Anesthetic complications: no    Darrin Nipper

## 2018-12-14 NOTE — H&P (Signed)
All labs reviewed. Abnormal studies sent to patients PCP when indicated.  Previous H&P reviewed, patient examined, there are NO CHANGES.  Billy Eshleman Porfilio5/19/20201:49 PM

## 2018-12-14 NOTE — Transfer of Care (Signed)
Immediate Anesthesia Transfer of Care Note  Patient: Billy Freeman.  Procedure(s) Performed: CATARACT EXTRACTION PHACO AND INTRAOCULAR LENS PLACEMENT (IOC LEFT (Left Eye)  Patient Location: PACU  Anesthesia Type: MAC  Level of Consciousness: awake, alert  and patient cooperative  Airway and Oxygen Therapy: Patient Spontanous Breathing and Patient connected to supplemental oxygen  Post-op Assessment: Post-op Vital signs reviewed, Patient's Cardiovascular Status Stable, Respiratory Function Stable, Patent Airway and No signs of Nausea or vomiting  Post-op Vital Signs: Reviewed and stable  Complications: No apparent anesthesia complications

## 2018-12-14 NOTE — Op Note (Signed)
PREOPERATIVE DIAGNOSIS:  Nuclear sclerotic cataract of the left eye.   POSTOPERATIVE DIAGNOSIS:  Nuclear sclerotic cataract of the left eye.   OPERATIVE PROCEDURE: Procedure(s): CATARACT EXTRACTION PHACO AND INTRAOCULAR LENS PLACEMENT (IOC LEFT   SURGEON:  Birder Robson, MD.   ANESTHESIA:  Anesthesiologist: Darrin Nipper, MD CRNA: Cameron Ali, CRNA  1.      Managed anesthesia care. 2.     0.107ml of Shugarcaine was instilled following the paracentesis   COMPLICATIONS:  None.   TECHNIQUE:   Stop and chop   DESCRIPTION OF PROCEDURE:  The patient was examined and consented in the preoperative holding area where the aforementioned topical anesthesia was applied to the left eye and then brought back to the Operating Room where the left eye was prepped and draped in the usual sterile ophthalmic fashion and a lid speculum was placed. A paracentesis was created with the side port blade and the anterior chamber was filled with viscoelastic. A near clear corneal incision was performed with the steel keratome. A continuous curvilinear capsulorrhexis was performed with a cystotome followed by the capsulorrhexis forceps. Hydrodissection and hydrodelineation were carried out with BSS on a blunt cannula. The lens was removed in a stop and chop  technique and the remaining cortical material was removed with the irrigation-aspiration handpiece. The capsular bag was inflated with viscoelastic and the Technis [id number] lens was placed in the capsular bag without complication. The remaining viscoelastic was removed from the eye with the irrigation-aspiration handpiece. The wounds were hydrated. The anterior chamber was flushed with BSS and the eye was inflated to physiologic pressure. 0.57ml Vigamox was placed in the anterior chamber. The wounds were found to be water tight. The eye was dressed with Vigamox and Combigan . The patient was given protective glasses to wear throughout the day and a shield with which to  sleep tonight. The patient was also given drops with which to begin a drop regimen today and will follow-up with me in one day. Implant Name Type Inv. Item Serial No. Manufacturer Lot No. LRB No. Used  LENS IOL DIOP 20.5 - [id number] Intraocular Lens LENS IOL DIOP 20.5 [id number]  Left 1    Procedure(s): CATARACT EXTRACTION PHACO AND INTRAOCULAR LENS PLACEMENT (IOC LEFT (Left)  Electronically signed: Birder Robson 12/14/2018 2:26 PM

## 2018-12-14 NOTE — Anesthesia Procedure Notes (Signed)
Procedure Name: MAC Performed by: Alasdair Kleve, CRNA Pre-anesthesia Checklist: Patient identified, Emergency Drugs available, Suction available, Timeout performed and Patient being monitored Patient Re-evaluated:Patient Re-evaluated prior to induction Oxygen Delivery Method: Nasal cannula Placement Confirmation: positive ETCO2       

## 2018-12-15 ENCOUNTER — Encounter: Payer: Self-pay | Admitting: Ophthalmology

## 2018-12-31 ENCOUNTER — Ambulatory Visit
Admission: RE | Admit: 2018-12-31 | Discharge: 2018-12-31 | Disposition: A | Discharge status: Home / Self Care | Payer: Medicare Other | Source: Ambulatory Visit | Attending: Orthopedic Surgery | Admitting: Orthopedic Surgery

## 2018-12-31 ENCOUNTER — Other Ambulatory Visit: Payer: Self-pay

## 2018-12-31 DIAGNOSIS — M19011 Primary osteoarthritis, right shoulder: Secondary | ICD-10-CM

## 2018-12-31 DIAGNOSIS — M75101 Unspecified rotator cuff tear or rupture of right shoulder, not specified as traumatic: Secondary | ICD-10-CM | POA: Diagnosis present

## 2019-01-03 ENCOUNTER — Ambulatory Visit (INDEPENDENT_AMBULATORY_CARE_PROVIDER_SITE_OTHER): Payer: Medicare Other

## 2019-01-03 ENCOUNTER — Other Ambulatory Visit: Payer: Self-pay | Admitting: Podiatry

## 2019-01-03 ENCOUNTER — Encounter: Payer: Self-pay | Admitting: Podiatry

## 2019-01-03 ENCOUNTER — Ambulatory Visit (INDEPENDENT_AMBULATORY_CARE_PROVIDER_SITE_OTHER): Payer: Medicare Other | Admitting: Podiatry

## 2019-01-03 ENCOUNTER — Other Ambulatory Visit: Payer: Self-pay

## 2019-01-03 VITALS — BP 100/71 | HR 69 | Temp 96.5°F | Resp 16

## 2019-01-03 DIAGNOSIS — S92502A Displaced unspecified fracture of left lesser toe(s), initial encounter for closed fracture: Secondary | ICD-10-CM | POA: Diagnosis not present

## 2019-01-03 DIAGNOSIS — L570 Actinic keratosis: Secondary | ICD-10-CM | POA: Insufficient documentation

## 2019-01-03 DIAGNOSIS — I519 Heart disease, unspecified: Secondary | ICD-10-CM | POA: Insufficient documentation

## 2019-01-03 DIAGNOSIS — R234 Changes in skin texture: Secondary | ICD-10-CM | POA: Diagnosis not present

## 2019-01-03 DIAGNOSIS — S9032XA Contusion of left foot, initial encounter: Secondary | ICD-10-CM

## 2019-01-03 NOTE — Progress Notes (Signed)
Subjective:  Patient ID: Billy Freeman., male    DOB: 1951-04-30,  MRN: [id number] HPI No chief complaint on file.   68 y.o. male presents with the above complaint.   ROS: Denies fever chills nausea vomiting muscle aches pains calf pain back pain chest pain shortness of breath.  Past Medical History:  Diagnosis Date   Cancer ([place])    SKIN   Cirrhosis ([place])    POSSIBLE   COPD (chronic obstructive pulmonary disease) ([facility])    Depression    Dysrhythmia    Edema    right FEET/LEG   Heart murmur    History of kidney stones    HOH (hard of hearing)    Hypertension    Pneumonia    January/February 2020   Pulmonary embolism ([facility])    Reflux    Rosacea    Sleep apnea    Tremors of nervous system    Wears hearing aid    Past Surgical History:  Procedure Laterality Date   ANTERIOR CRUCIATE LIGAMENT (ACL) REVISION     broken nose surgery     CATARACT EXTRACTION W/PHACO Right 02/09/2018   Procedure: CATARACT EXTRACTION PHACO AND INTRAOCULAR LENS PLACEMENT ([place]);  Surgeon: Birder Robson, MD;  Location: [facility];  Service: Ophthalmology;  Laterality: Right;  [place] 00:45 AP% 14.4 CDE 6.58 Fluid pack lot # [id number]   CATARACT EXTRACTION W/PHACO Left 12/14/2018   Procedure: CATARACT EXTRACTION PHACO AND INTRAOCULAR LENS PLACEMENT (IOC LEFT;  Surgeon: Birder Robson, MD;  Location: [place];  Service: Ophthalmology;  Laterality: Left;   FLEXIBLE BRONCHOSCOPY N/A 04/30/2017   Procedure: FLEXIBLE BRONCHOSCOPY;  Surgeon: Laverle Hobby, MD;  Location: [facility];  Service: Pulmonary;  Laterality: N/A;   JOINT REPLACEMENT     TKR   KIDNEY STONE SURGERY     MENISCUS REPAIR Bilateral    right knee x 3, left knee x 2   TOE AMPUTATION Bilateral    index toes    Current Outpatient Medications:    albuterol (PROVENTIL HFA;VENTOLIN HFA) 108 (90 Base) MCG/ACT inhaler, Inhale 2 puffs into the lungs every 6 (six) hours as needed for wheezing or  shortness of breath. , Disp: , Rfl:    albuterol (PROVENTIL) (2.5 MG/3ML) 0.083% nebulizer solution, Take 3 mLs (2.5 mg total) by nebulization every 6 (six) hours as needed for wheezing or shortness of breath. (Patient not taking: Reported on 12/09/2018), Disp: 75 mL, Rfl: 12   allopurinol (ZYLOPRIM) 100 MG tablet, Take 100 mg by mouth daily. , Disp: , Rfl:    apixaban (ELIQUIS) 5 MG TABS tablet, Take 1 tablet (5 mg total) by mouth 2 (two) times daily. Take 10 mg (two pills) twice daily for seven days, then take 17m (one pill) twice daily., Disp: 60 tablet, Rfl: 0   aspirin EC 81 MG tablet, Take 81 mg by mouth daily. , Disp: , Rfl:    azithromycin (ZITHROMAX) 250 MG tablet, Take 250 mg by mouth daily. , Disp: , Rfl:    benzonatate (TESSALON PERLES) 100 MG capsule, Take 1 capsule (100 mg total) by mouth 3 (three) times daily as needed for cough. (Patient not taking: Reported on 12/09/2018), Disp: 30 capsule, Rfl: 0   budesonide-formoterol (SYMBICORT) 160-4.5 MCG/ACT inhaler, Inhale 2 puffs into the lungs 2 (two) times daily., Disp: , Rfl:    citalopram (CELEXA) 20 MG tablet, Take 20 mg by mouth daily. , Disp: , Rfl:    clindamycin (CLEOCIN-T) 1 % external solution, Apply to affected area  twice daily as needed for breakout, Disp: , Rfl:    indapamide (LOZOL) 2.5 MG tablet, Take 2.5 mg by mouth daily., Disp: , Rfl:    losartan (COZAAR) 25 MG tablet, Take 25 mg by mouth daily. , Disp: , Rfl:    Omega-3 Fatty Acids (FISH OIL) 1200 MG CAPS, Take 1,200 mg by mouth 2 (two) times daily. , Disp: , Rfl:    propranolol (INDERAL) 20 MG tablet, Take 20 mg by mouth 2 (two) times daily. , Disp: , Rfl:    tadalafil (CIALIS) 5 MG tablet, 2 tabs as needed prior to sexual activity, Disp: , Rfl:    temazepam (RESTORIL) 15 MG capsule, Take 15 mg by mouth at bedtime. , Disp: , Rfl:   Allergies  Allergen Reactions   Oxycodone Itching   Review of Systems Objective:  There were no vitals filed for this  visit.  General: Well developed, nourished, in no acute distress, alert and oriented x3   Dermatological: Skin is warm, dry and supple bilateral. Nails x 10 are well maintained; remaining integument appears unremarkable at this time. There are no open sores, no preulcerative lesions, no rash or signs of infection present.  Reactive hyperkeratotic lesion distal medial aspect hallux right secondary to mild malleus deformity.  Vascular: Dorsalis Pedis artery and Posterior Tibial artery pedal pulses are 2/4 bilateral with immedate capillary fill time. Pedal hair growth present. No varicosities and no lower extremity edema present bilateral.   Neruologic: Grossly intact via light touch bilateral. Vibratory intact via tuning fork bilateral. Protective threshold with Semmes Wienstein monofilament intact to all pedal sites bilateral. Patellar and Achilles deep tendon reflexes 2+ bilateral. No Babinski or clonus noted bilateral.   Musculoskeletal: No gross boney pedal deformities bilateral. No pain, crepitus, or limitation noted with foot and ankle range of motion bilateral. Muscular strength 5/5 in all groups tested bilateral.  Mild edema ecchymosis swelling fifth digit left foot.  Gait: Unassisted, Nonantalgic.    Radiographs:  Radiographs taken today demonstrate an impaction fracture to the fifth proximal phalanx left foot.  No other acute abnormalities noted.  Assessment & Plan:   Assessment: Dry cracked skin hallux right fracture fifth digit left.  Plan: Discussed etiology pathology conservative surgical therapies.  At this point I demonstrated how to wrap the toe and recommended the use of a Darco shoe.  I recommended O'Keefe's cream for the dry skin on the toe.     Billy Freeman T. [place], [place]

## 2019-01-07 DIAGNOSIS — M7581 Other shoulder lesions, right shoulder: Secondary | ICD-10-CM | POA: Insufficient documentation

## 2019-01-07 DIAGNOSIS — M7521 Bicipital tendinitis, right shoulder: Secondary | ICD-10-CM | POA: Insufficient documentation

## 2019-01-07 DIAGNOSIS — S46011A Strain of muscle(s) and tendon(s) of the rotator cuff of right shoulder, initial encounter: Secondary | ICD-10-CM | POA: Insufficient documentation

## 2019-01-26 ENCOUNTER — Other Ambulatory Visit: Payer: Self-pay

## 2019-01-26 ENCOUNTER — Encounter
Admission: RE | Admit: 2019-01-26 | Discharge: 2019-01-26 | Disposition: A | Discharge status: Home / Self Care | Payer: Medicare Other | Source: Ambulatory Visit | Attending: Surgery | Admitting: Surgery

## 2019-01-26 DIAGNOSIS — M75121 Complete rotator cuff tear or rupture of right shoulder, not specified as traumatic: Secondary | ICD-10-CM | POA: Diagnosis not present

## 2019-01-26 DIAGNOSIS — Z01812 Encounter for preprocedural laboratory examination: Secondary | ICD-10-CM | POA: Diagnosis present

## 2019-01-26 DIAGNOSIS — Z1159 Encounter for screening for other viral diseases: Secondary | ICD-10-CM | POA: Insufficient documentation

## 2019-01-26 HISTORY — DX: Other nonspecific abnormal finding of lung field: R91.8

## 2019-01-26 HISTORY — DX: Gastro-esophageal reflux disease without esophagitis: K21.9

## 2019-01-26 NOTE — Patient Instructions (Signed)
Your procedure is scheduled on: 02-01-19 TUESDAY Report to Same Day Surgery 2nd floor medical mall [facility] Entrance-take elevator on left to 2nd floor.  Check in with surgery information desk.) To find out your arrival time please call [phone] between 1PM - 3PM on 01-31-19 MONDAY  Remember: Instructions that are not followed completely may result in serious medical risk, up to and including death, or upon the discretion of your surgeon and anesthesiologist your surgery may need to be rescheduled.    _x___ 1. Do not eat food after midnight the night before your procedure. NO GUM OR CANDY AFTER MIDNIGHT. You may drink clear liquids up to 2 hours before you are scheduled to arrive at the hospital for your procedure.  Do not drink clear liquids within 2 hours of your scheduled arrival to the hospital.  Clear liquids include  --Water or Apple juice without pulp  --Clear carbohydrate beverage such as ClearFast or Gatorade  --Black Coffee or Clear Tea (No milk, no creamers, do not add anything to the coffee or Tea   ____Ensure clear carbohydrate drink on the way to the hospital for bariatric patients  ____Ensure clear carbohydrate drink 3 hours before surgery for Dr Dwyane Luo patients if physician instructed.    __x__ 2. No Alcohol for 24 hours before or after surgery.   __x__3. No Smoking or e-cigarettes for 24 prior to surgery.  Do not use any chewable tobacco products for at least 6 hour prior to surgery   ____  4. Bring all medications with you on the day of surgery if instructed.    __x__ 5. Notify your doctor if there is any change in your medical condition     (cold, fever, infections).    x___6. On the morning of surgery brush your teeth with toothpaste and water.  You may rinse your mouth with mouth wash if you wish.  Do not swallow any toothpaste or mouthwash.   Do not wear jewelry, make-up, hairpins, clips or nail polish.  Do not wear lotions, powders, or perfumes. You  may wear deodorant.  Do not shave 48 hours prior to surgery. Men may shave face and neck.  Do not bring valuables to the hospital.    [facility] is not responsible for any belongings or valuables.               Contacts, dentures or bridgework may not be worn into surgery.  Leave your suitcase in the car. After surgery it may be brought to your room.  For patients admitted to the hospital, discharge time is determined by your treatment team.  _  Patients discharged the day of surgery will not be allowed to drive home.  You will need someone to drive you home and stay with you the night of your procedure.    Please read over the following fact sheets that you were given:   [facility] Preparing for Surgery   _x___ TAKE THE FOLLOWING MEDICATION THE MORNING OF SURGERY WITH A SMALL SIP OF WATER. These include:  1. ALLOPURINOL  2. CELEXA (CITALOPRAM)  3. PROPRANOLOL  4.  5.  6.  ____Fleets enema or Magnesium Citrate as directed.   _x___ Use CHG Soap or sage wipes as directed on instruction sheet   _X___ Use inhalers on the day of surgery -USE YOUR SYMBICORT DAY OF SURGERY  ____ Stop Metformin and Janumet 2 days prior to surgery.    ____ Take 1/2 of usual insulin dose the night before  surgery and none on the morning surgery.   _x___ Follow recommendations from Cardiologist, Pulmonologist or PCP regarding stopping Aspirin, Coumadin, Plavix ,Eliquis, Effient, or Pradaxa, and Pletal-DR PARASCHOS OFFICE INSTRUCTED PT TO STOP ELIQUIS 2 DAYS PRIOR TO SURGERY  X____Stop Anti-inflammatories such as Advil, Aleve, Ibuprofen, Motrin, Naproxen, Naprosyn, Goodies powders or aspirin products NOW-OK to take Tylenol   _x___ Stop supplements until after surgery-STOP FISH OIL NOW-MAY RESUME AFTER SURGERY   ____ Bring C-Pap to the hospital.

## 2019-01-26 NOTE — Pre-Procedure Instructions (Signed)
Echo complete4/20/2020 [place] Component Name Value Ref Range  LV Ejection Fraction (%) 40   Aortic Valve Stenosis Grade none   Aortic Valve Regurgitation Grade none   Aortic Valve Max Velocity (m/s) 0.9 m/sec  Mitral Valve Stenosis Grade none   Mitral Valve Regurgitation Grade mild   Tricuspid Valve Regurgitation Grade mild   Tricuspid Valve Regurgitation Max Velocity (m/s) 3 m/sec  Right Ventricle Systolic Pressure (mmHg) 16.1 mmHg  LV End Diastolic Diameter (cm) 5.3 cm  LV End Systolic Diameter (cm) 4.2 cm  LV Septum Wall Thickness (cm) 1.1 cm  LV Posterior Wall Thickness (cm) 1.1 cm  Left Atrium Diameter (cm) 4.9 cm  Result Narrative             CARDIOLOGY DEPARTMENT          [place], [place]                  [id number]      [place] #: [id number]      [place], [place], [place] [address]    Date: 11/04/2018 10: 66 AM                                Adult  Male Age: 68 yrs      ECHOCARDIOGRAM REPORT               Outpatient                                KC^^KCWC    STUDY:CHEST WALL        TAPE:          MD1: PARASCHOS, ALEXANDER    ECHO:Yes  DOPPLER:Yes    FILE:          BP: 102/62 mmHg    COLOR:Yes  CONTRAST:No   MACHINE:Philips  RV BIOPSY:No     3D:No SOUND QLTY:Moderate      Height: 76 in   MEDIUM:None                       Weight: 230 lb                                BSA: 2.4 m2 _________________________________________________________________________________________        HISTORY: DOE         REASON: Assess, LV function       INDICATION: Essential hypertension [I10 (ICD-10-CM)]   , _________________________________________________________________________________________ ECHOCARDIOGRAPHIC MEASUREMENTS 2D DIMENSIONS AORTA         Values  Normal Range  MAIN PA     Values  Normal Range        Annulus: 2.0 cm    [2.3-2.9]     PA Main: nm*    [1.5-2.1]       Aorta Sin: 3.3 cm    [3.1-3.7]  RIGHT VENTRICLE      ST Junction: nm*     [2.6-3.2]     RV Base: nm*    [<4.2]       Asc.Aorta: nm*     [2.6-3.4]     RV Mid: 4.0 cm  [< 3.5] LEFT VENTRICLE  RV Length: nm*    [<8.6]         LVIDd: 5.3 cm    [4.2-5.9]  INFERIOR VENA CAVA         LVIDs: 4.2 cm            Max. IVC: nm*    [<=2.1]           FS: 19.7 %    [>25]      Min. IVC: nm*          SWT: 1.1 cm    [0.6-1.0]  ------------------          PWT: 1.1 cm    [0.6-1.0]  nm* - not measured LEFT ATRIUM        LA Diam: 4.9 cm    [3.0-4.0]      LA A4C Area: nm*     [<20]       LA Volume: nm*     [18-58] _________________________________________________________________________________________ ECHOCARDIOGRAPHIC DESCRIPTIONS AORTIC ROOT          Size: Normal       Dissection: INDETERM FOR DISSECTION AORTIC VALVE        Leaflets: Tricuspid          Morphology: Normal        Mobility: Fully mobile LEFT VENTRICLE          Size: Normal            Anterior: Normal      Contraction: REGIONALLY IMPAIRED      Lateral: Normal       Closest EF: 40% (Estimated)         Septal: HYPOCONTRACTILE       LV Masses: No Masses            Apical: HYPOCONTRACTILE          LVH: None             Inferior: HYPOCONTRACTILE                           Posterior: Normal       Dias.FxClass: N/A MITRAL VALVE        Leaflets: Normal            Mobility: Fully mobile       Morphology: THICKENED LEAFLET(S) LEFT ATRIUM          Size: SEVERELY ENLARGED      LA Masses: No masses       IA Septum: Normal IAS MAIN PA          Size: Normal PULMONIC VALVE       Morphology: Normal            Mobility: Fully mobile RIGHT VENTRICLE       RV Masses: No Masses             Size: Normal       Free Wall: Normal           Contraction: Normal TRICUSPID VALVE        Leaflets: Normal            Mobility: Fully mobile       Morphology: Normal RIGHT ATRIUM          Size: SEVERELY ENLARGED       RA Other: None        RA Mass: No masses PERICARDIUM         Fluid: No effusion INFERIOR VENACAVA  Size: Normal Normal respiratory collapse _________________________________________________________________________________________  DOPPLER ECHO and OTHER SPECIAL PROCEDURES         Aortic: No AR           No AS             90.5 cm/sec peak vel    3.3 mmHg peak grad         Mitral: MILD MR          No MS             MV Inflow E Vel = 88.3 cm/sec   MV Annulus E'Vel = 9.0 cm/sec             E/E'Ratio = 9.8       Tricuspid: MILD TR          No TS             295.9 cm/sec peak TR vel  40.0 mmHg peak RV pressure       Pulmonary: No PR           No PS             62.4 cm/sec peak vel    1.6 mmHg peak grad _________________________________________________________________________________________ INTERPRETATION MILD LV SYSTOLIC DYSFUNCTION (See above) NORMAL RIGHT VENTRICULAR SYSTOLIC FUNCTION MILD VALVULAR REGURGITATION (See above) NO VALVULAR STENOSIS MILD to MODERATE MR, TR MILD PHTN EF  40% _________________________________________________________________________________________ Electronically signed by   MD Miquel Dunn on 11/15/2018 08: 16 AM      Performed By: Maurilio Lovely, RDCS   Ordering Physician: Clabe Seal _________________________________________________________________________________________  Other Result Information  Interface, Text Results In - 11/15/2018  8:17 AM EDT                      [place], Alexis                                    [id number]           [place] #: [id number]           [address], [place], [place] [address]       Date: 11/04/2018 10: 27 AM                                                              Adult   Male  Age: 55 yrs           ECHOCARDIOGRAM REPORT                              Outpatient  KC^^KCWC      STUDY:CHEST WALL               TAPE:                    MD1: PARASCHOS, ALEXANDER       ECHO:Yes    DOPPLER:Yes       FILE:                    BP: 102/62 mmHg      COLOR:Yes   CONTRAST:No     MACHINE:Philips  RV BIOPSY:No          3D:No  SOUND QLTY:Moderate            Height: 76 in     MEDIUM:None                                              Weight: 230 lb                                                              BSA: 2.4 m2 _________________________________________________________________________________________               HISTORY: DOE                REASON: Assess, LV function            INDICATION: Essential hypertension [I10 (ICD-10-CM)]    , _________________________________________________________________________________________ ECHOCARDIOGRAPHIC MEASUREMENTS 2D DIMENSIONS AORTA                  Values   Normal Range   MAIN PA         Values    Normal Range               Annulus: 2.0 cm       [2.3-2.9]         PA Main: nm*        [1.5-2.1]             Aorta Sin: 3.3 cm       [3.1-3.7]    RIGHT VENTRICLE           ST Junction: nm*          [2.6-3.2]         RV Base: nm*       [<4.2]             Asc.Aorta: nm*          [2.6-3.4]          RV Mid: 4.0 cm    [< 3.5] LEFT VENTRICLE                                      RV Length: nm*       [<8.6]                 LVIDd: 5.3 cm       [4.2-5.9]    INFERIOR VENA CAVA                 LVIDs:  4.2 cm                        Max. IVC: nm*       [<=2.1]                    FS: 19.7 %       [>25]            Min. IVC: nm*                   SWT: 1.1 cm       [0.6-1.0]    ------------------                   PWT: 1.1 cm       [0.6-1.0]    nm* - not measured LEFT ATRIUM               LA Diam: 4.9 cm       [3.0-4.0]           LA A4C Area: nm*          [<20]             LA Volume: nm*          [18-58] _________________________________________________________________________________________ ECHOCARDIOGRAPHIC DESCRIPTIONS AORTIC ROOT                  Size: Normal            Dissection: INDETERM FOR DISSECTION AORTIC VALVE              Leaflets: Tricuspid                   Morphology: Normal              Mobility: Fully mobile LEFT VENTRICLE                  Size: Normal                        Anterior: Normal           Contraction: REGIONALLY IMPAIRED            Lateral: Normal            Closest EF: 40% (Estimated)                 Septal: HYPOCONTRACTILE             LV Masses: No Masses                       Apical: HYPOCONTRACTILE                   LVH: None                          Inferior: HYPOCONTRACTILE                                                     Posterior: Normal          Dias.FxClass: N/A MITRAL VALVE              Leaflets: Normal  Mobility: Fully mobile            Morphology: THICKENED LEAFLET(S) LEFT ATRIUM                  Size: SEVERELY ENLARGED            LA Masses: No masses             IA Septum: Normal IAS MAIN PA                  Size:  Normal PULMONIC VALVE            Morphology: Normal                        Mobility: Fully mobile RIGHT VENTRICLE             RV Masses: No Masses                         Size: Normal             Free Wall: Normal                     Contraction: Normal TRICUSPID VALVE              Leaflets: Normal                        Mobility: Fully mobile            Morphology: Normal RIGHT ATRIUM                  Size: SEVERELY ENLARGED             RA Other: None               RA Mass: No masses PERICARDIUM                 Fluid: No effusion INFERIOR VENACAVA                  Size: Normal Normal respiratory collapse _________________________________________________________________________________________  DOPPLER ECHO and OTHER SPECIAL PROCEDURES                Aortic: No AR                      No AS                        90.5 cm/sec peak vel       3.3 mmHg peak grad                Mitral: MILD MR                    No MS                        MV Inflow E Vel = 88.3 cm/sec     MV Annulus E'Vel = 9.0 cm/sec                        E/E'Ratio = 9.8             Tricuspid: MILD TR                    No TS  295.9 cm/sec peak TR vel   40.0 mmHg peak RV pressure             Pulmonary: No PR                      No PS                        62.4 cm/sec peak vel       1.6 mmHg peak grad _________________________________________________________________________________________ INTERPRETATION MILD LV SYSTOLIC DYSFUNCTION (See above) NORMAL RIGHT VENTRICULAR SYSTOLIC FUNCTION MILD VALVULAR REGURGITATION (See above) NO VALVULAR STENOSIS MILD to MODERATE MR, TR MILD PHTN EF 40% _________________________________________________________________________________________ Electronically signed by      MD Miquel Dunn on 11/15/2018 08: 16 AM          Performed By: Maurilio Lovely, RDCS    Ordering Physician: Clabe Seal _________________________________________________________________________________________  Status Results Details   Encounter Summary

## 2019-01-26 NOTE — Pre-Procedure Instructions (Signed)
Progress Notes - documented in this encounter Erby Pian, MD - 10/28/2018 9:15 AM EDT Formatting of this note might be different from the original.  Follow-up  History of Present Illness: Billy Freeman is a 68 y.o. male that presents to clinic for recheck. No pulmonary sxs, went over chest ct with him.  CLINICAL DATA: Followup pulmonary nodules. Emphysema. Cirrhosis.  EXAM: CT CHEST WITHOUT CONTRAST  TECHNIQUE: Multidetector CT imaging of the chest was performed following the standard protocol without IV contrast.  COMPARISON: 07/05/2018 and older studies dating back to 09/19/2016  FINDINGS: Cardiovascular: No acute findings. Aortic and coronary artery atherosclerosis.  Mediastinum/Nodes: No masses or pathologically enlarged lymph nodes identified on this unenhanced exam.  Lungs/Pleura: Moderate emphysema and bilateral pleural-parenchymal scarring again demonstrated. New subsegmental atelectasis is seen in the posterior right middle lobe. No evidence of pulmonary consolidation or pleural effusion. Several scattered sub-cm bilateral pulmonary nodules are stable, consistent with benign etiology. No new or enlarging pulmonary nodules or masses identified.  Upper Abdomen: Hepatic cirrhosis again demonstrated. Tiny calcified gallstones also seen, without evidence of cholecystitis.  Musculoskeletal: No suspicious bone lesions. Old T1 vertebral body compression fracture deformity again seen.  IMPRESSION: 1. Stable sub-cm bilateral pulmonary nodules, consistent with benign etiology. 2. New subsegmental atelectasis in posterior right middle lobe. No evidence of pulmonary consolidation or pleural effusion. 3. Hepatic cirrhosis and cholelithiasis.  Aortic Atherosclerosis (ICD10-I70.0) and Emphysema (ICD10-J43.9). Coronary artery atherosclerosis.  Electronically Signed By: Earle Gell M.D. On: 10/26/2018 15:09  Current Medications:  Current Outpatient Medications   Medication Sig Dispense Refill  . allopurinol (ZYLOPRIM) 100 MG tablet TAKE 1 TABLET BY MOUTH EVERY DAY 30 tablet 5  . apixaban (ELIQUIS) 5 mg tablet Take 1 tablet (5 mg total) by mouth every 12 (twelve) hours 180 tablet 3  . budesonide-formoterol (SYMBICORT) 160-4.5 mcg/actuation inhaler Inhale 2 inhalations into the lungs 2 (two) times daily 3 Inhaler 3  . citalopram (CELEXA) 20 MG tablet Take 20 mg by mouth once daily.  . clindamycin (CLEOCIN T) 1 % topical solution Cleocin T 1 % solution APPLY A THIN LAYER TO THE AFFECTED AREA(S) BY TOPICAL ROUTE 2 TIMES PER DAY  . cyanocobalamin (VITAMIN B12) 1000 MCG tablet Take 1,000 mcg by mouth once daily  . indapamide (LOZOL) 2.5 MG tablet Take 2.5 mg by mouth every other day  . miscellaneous medical supply Misc INCENTIVE SPIROMETER - USE AS DIRECTED 1 each 1  . omega-3 fatty acids/fish oil 340-1,000 mg capsule Take 1 capsule by mouth 2 (two) times daily  . propranolol (INDERAL) 20 MG tablet TAKE 1 TABLET BY MOUTH TWICE A DAY 180 tablet 1  . tadalafil (CIALIS) 5 MG tablet 2 tabs as needed prior to sexual activity 30 tablet 5  . temazepam (RESTORIL) 15 mg capsule TAKE 1 CAPSULE BY MOUTH NIGHTLY AS NEEDED FOR SLEEP 30 capsule 4  . albuterol (PROVENTIL) 2.5 mg /3 mL (0.083 %) nebulizer solution Take 3 mLs (2.5 mg total) by nebulization 4 (four) times daily DIAGNOSIS J44.9 (Patient not taking: Reported on 10/28/2018 ) 75 mL 11   No current facility-administered medications for this visit.   Problem List:  Patient Active Problem List  Diagnosis  . HTN (hypertension)  . OSA on CPAP  . Depression  . Occasional tremors  . GERD (gastroesophageal reflux disease)  . COPD (chronic obstructive pulmonary disease) (CMS-HCC)  . Kidney stones  . Rosacea  . Decreased left ventricular function  . Hyperlipidemia  . Calculus of kidney  . Hydronephrosis  .  Renal colic  . Calculus of ureter  . Esophageal reflux  . Essential hypertension  . Chronic obstructive  pulmonary disease ([place])  . Obstructive sleep apnea  . Heart disease  . Impotence of organic origin  . Closed compression fracture of L1 lumbar vertebra (CMS-HCC)  . Personal history of gout  . History of pulmonary embolism  . Atrial fibrillation (CMS-HCC)   History: Past Medical History:  Diagnosis Date  . Atrial fibrillation (CMS-HCC) June 2019  . Basal cell carcinoma  resected on nose, Dr. Katharina Caper in [place]  . Benign essential tremor  . COPD (chronic obstructive pulmonary disease) (CMS-HCC)  . Decreased left ventricular function  mildly  . Depression  . Diabetes mellitus type 2, uncomplicated (CMS-HCC)  . Diverticulosis  colon polyps on colonoscopy  . Fatty liver 2006  presumed due to alcoholic hepatitis status post liver biopsy  . GERD (gastroesophageal reflux disease)  . Glaucoma (increased eye pressure)  Hx of borderline  . Gout  . Hyperlipidemia  . Hypertension  . Insomnia  . Pulmonary embolism (CMS-HCC) FEB 2017  . Sleep apnea  on CPAP   Past Surgical History:  Procedure Laterality Date  . BRONCHOSCOPY Early 2019  . BUNION CORRECTION Bilateral  S/P  . COLONOSCOPY 10/17/2010  08/11/2007, 06/17/2001; Adenomatous Polyps, FH Colon Polyps (Mother/Sister): [facility] 09/2015  . COLONOSCOPY 01/08/2016  Adenomatous Polyps, FH Colon Polyps (Mother): [facility] 12/2020  . KNEE ARTHROSCOPY Bilateral  x2 bilateral, total of 4  . KNEE ARTHROSCOPY Right 10/15/2012  Dr. Marry Guan, meniscus surgery  . LIVER BIOPSY 2006 and 2010   Family History  Problem Relation Age of Onset  . High blood pressure (Hypertension) Mother  . Heart disease Mother  . Cancer Mother  . Emphysema Father  . High blood pressure (Hypertension) Father  . Diabetes type II Father  . Heart disease Father  . COPD Father   Social History   Socioeconomic History  . Marital status: Married  Spouse name: Not on file  . Number of children: Not on file  . Years of education: Not on file  . Highest education level:  Not on file  Occupational History  . Not on file  Social Needs  . Financial resource strain: Not on file  . Food insecurity:  Worry: Not on file  Inability: Not on file  . Transportation needs:  Medical: Not on file  Non-medical: Not on file  Tobacco Use  . Smoking status: Never Smoker  . Smokeless tobacco: Never Used  . Tobacco comment: uses edibles  Substance and Sexual Activity  . Alcohol use: Not Currently  Alcohol/week: 0.0 standard drinks  Comment: very little beer  . Drug use: Not Currently  Frequency: 7.0 times per week  Types: Marijuana  Comment: pot only. just edibles. not smoking any longer.  Marland Kitchen Sexual activity: Defer  Partners: Female  Comment: wife  Other Topics Concern  . Not on file  Social History Narrative  . Not on file   Allergies:  Oxycodone  Review of Systems: As per above. Pretty much unchanged with the exception that his breathing is improved. No associated cardiopulmonary, GI, GU, dermatological symptoms today. No focal neurological symptoms or psychological changes.   Physical Exam: BP 99/64 (BP Location: Right upper arm, Patient Position: Sitting, BP Cuff Size: Adult)  Pulse 58  Temp 36.5 C (97.7 F) (Oral)  Wt (!) 104.3 kg (230 lb)  SpO2 95%  BMI 28.00 kg/m (!) 104.3 kg (230 lb) 95% General: NAD. Able to speak in  complete sentences without cough or dyspnea HEENT: Normocephalic, nontraumatic. Extraocular movements intact NECK: Supple. No JVD, nodes, thryomegaly CV: RRR no murmurs, gallops, rubs PULM: Normal respiratory effort, Clear to auscultation bilaterally without wheezing or crackles EXTREMITIES: No significant edema, cyanosis or Homans'signs SKIN: Fair turgor. No rashes LYMPHATIC: No nodes NEURO: No gross deficits, no change PSYCH: Appropriate affect, alert, oroented   Impression: Multiple pulmonary nodulesmean diameter of6 mm.Nomore blood in sputum. S/p bronch was benign.The last two cxrs +? New LUL nodule. Chest ct did  not show new nodule or enlarging nodules. ( followed x 2 years.) ? Button was seen on the cxr istead of a nodule.  Follow   Moderate copd, not smoking, no resp, distress, stable.  -continue same regimen ( symbicort, prn albuterol)  Sleep apnea, on cpap, 12 cm h20, resting well.  -continue cpap on present level      Electronically signed by Erby Pian, MD at 10/28/2018 9:42 AM EDT

## 2019-01-26 NOTE — Pre-Procedure Instructions (Signed)
ECG 12-lead3/04/2019 [place] Component Name Value Ref Range  Vent Rate (bpm) 49   QRS Interval (msec) 80   QT Interval (msec) 446   QTc (msec) 402   Other Result Information  This result has an attachment that is not available.  Result Narrative  Atrial fibrillation with slow ventricular response Low voltage QRS Cannot rule out Anteroseptal infarct , age undetermined Abnormal ECG When compared with ECG of 25-Aug-2014 11:20, Previous ECG has undetermined rhythm, needs review Questionable change in QRS duration Minimal criteria for Anteroseptal infarct are now present I reviewed and concur with this report. Electronically signed [email or web address], MD, Cristie Hem ([id number]) on 10/15/2018 9:03:55 AM  Status Results Details   Encounter Summary

## 2019-01-26 NOTE — Pre-Procedure Instructions (Signed)
Progress Notes - documented in this encounter Billy Freeman, Billy Freeman - 01/20/2019 9:30 AM EDT Formatting of this note might be different from the original. Established Patient Visit   Chief Complaint: Chief Complaint  Patient presents with  . Follow-up  3 months-shoulder surgery july 7 dr poggi  Date of Service: 01/20/2019 Date of Birth: [birth year] PCP: Azzie Glatter, MD  History of Present Illness: [name] is a 68 y.o.male patient who returns for  1. Mild nonischemic cardiomyopathy 2. Essential hypertension 3. Hyperlipidemia 4. COPD 5. Obstructive sleep apnea 6. Right lower lobe pulmonary embolus, bilateral DVT 09/19/2016 7. Atrial fibrillation  The patient was incidentally noted to be in atrial fibrillation at a recent office visit on 10/05/2018 for preoperative cardiovascular evaluation prior to consideration of laparoscopic cholecystectomy and cataract surgery. ECG revaled atrial fibrillation with slow ventricular response at a rate of 49 bpm with low voltage. He wore a 72-hour Holter monitor which revealed sustained atrial fibrillation with an average heart rate of 60 bpm, minimum heart rate 32 bpm, and maximum heart rate 95 bpm, with rare premature ventricular contractions. Due to being asymptomatic, denying palpitations or heart racing, his propanolol (used for tremor) was continued. He was started on Eliquis for stroke prevention. TSH on 07/08/2018 was within normal range. BUN, creatinine and GFR were 25, 0.9, and 84, respectively, on 07/30/2018. 2D echocardiogram on 01/18/2018 revealed normal left ventricular function, with LVEF 55 to 60% with mild mitral regurgitation. Repeat 2D echocardiogram on 11/04/2018 revealed moderately reduced left ventricular function with LVEF 40-45%, biatrial enlargement, and mild to moderate mitral and tricuspid regurgitation. Past echocardiograms have revealed LVEF 45%.   The patient returns today for follow-up and reports doing well from a  cardiovascular perspective. The patient is scheduled for shoulder surgery on July 7. He denies chest pain. He denies palpitations or heart racing. He has mild exertional shortness of breath, followed by Dr. Raul Del. He has mild intermittent peripheral edema. He denies lightheadedness, dizziness, or significant fatigue. He denies presyncope or syncope. 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 He is compliant with his CPAP nightly.   The patient has atrial fibrillation, as stated above, with a chads vasc score of 4 (age, hypertension, history of thromboembolism), on Eliquis for stroke prevention, which is well tolerated without significant bleeding side effects, with easy bleeding.  The patient has essential hypertension, blood pressure low today, currently on propanolol (for tremor), and indapamide every other day, which are well tolerated without apparent side effects. He is asymptomatic,and reports that he feels well. The patient follows a low-sodium, no added salt diet.  The patient has hyperlipidemia, LDL cholesterol is 134 on 07/08/18, which is improved from previous, currently on diet therapy, followed by his primary care provider. The patient follows a low-cholesterol, low-fat diet.  Past Medical and Surgical History  Past Medical History Past Medical History:  Diagnosis Date  . Atrial fibrillation (CMS-HCC) June 2019  . Basal cell carcinoma  resected on nose, Dr. Katharina Caper in [place]  . Benign essential tremor  . COPD (chronic obstructive pulmonary disease) (CMS-HCC)  . Decreased left ventricular function  mildly  . Depression  . Diabetes mellitus type 2, uncomplicated (CMS-HCC)  . Diverticulosis  colon polyps on colonoscopy  . Fatty liver 2006  presumed due to alcoholic hepatitis status post liver biopsy  . GERD (gastroesophageal reflux disease)  . Glaucoma (increased eye pressure)  Hx of borderline  . Gout  .  Hyperlipidemia  .  Hypertension  . Insomnia  . Pulmonary embolism (CMS-HCC) FEB 2017  . Sleep apnea  on CPAP   Past Surgical History He has a past surgical history that includes Bunion Correction (Bilateral); liver biopsy (2006 and 2010); Knee arthroscopy (Bilateral); Knee arthroscopy (Right, 10/15/2012); Colonoscopy (10/17/2010); Colonoscopy (01/08/2016); and Bronchoscopy (Early 2019).   Medications and Allergies  Current Medications  Current Outpatient Medications  Medication Sig Dispense Refill  . albuterol (PROVENTIL) 2.5 mg /3 mL (0.083 %) nebulizer solution Take 3 mLs (2.5 mg total) by nebulization 4 (four) times daily DIAGNOSIS J44.9 (Patient not taking: Reported on 01/07/2019 ) 75 mL 11  . allopurinoL (ZYLOPRIM) 100 MG tablet TAKE 1 TABLET BY MOUTH EVERY DAY 30 tablet 5  . apixaban (ELIQUIS) 5 mg tablet Take 1 tablet (5 mg total) by mouth every 12 (twelve) hours 180 tablet 3  . citalopram (CELEXA) 20 MG tablet Take 20 mg by mouth once daily.  . clindamycin (CLEOCIN T) 1 % topical solution Cleocin T 1 % solution APPLY A THIN LAYER TO THE AFFECTED AREA(S) BY TOPICAL ROUTE 2 TIMES PER DAY  . cyanocobalamin (VITAMIN B12) 1000 MCG tablet Take 1,000 mcg by mouth once daily  . indapamide (LOZOL) 2.5 MG tablet Take 2.5 mg by mouth every other day  . miscellaneous medical supply Misc INCENTIVE SPIROMETER - USE AS DIRECTED 1 each 1  . omega-3 fatty acids/fish oil 340-1,000 mg capsule Take 1 capsule by mouth 2 (two) times daily  . propranolol (INDERAL) 20 MG tablet TAKE 1 TABLET BY MOUTH TWICE A DAY 180 tablet 1  . tadalafil (CIALIS) 5 MG tablet 2 tabs as needed prior to sexual activity 30 tablet 5  . temazepam (RESTORIL) 15 mg capsule Take 1 capsule (15 mg total) by mouth nightly as needed for Sleep 30 capsule 1   No current facility-administered medications for this visit.   Allergies: Oxycodone  Social and Family History  Social History reports that he has never smoked. He has never used smokeless  tobacco. He reports previous alcohol use. He reports previous drug use. Frequency: 7.00 times per week. Drug: Marijuana.  Family History Family History  Problem Relation Age of Onset  . High blood pressure (Hypertension) Mother  . Heart disease Mother  . Cancer Mother  . Emphysema Father  . High blood pressure (Hypertension) Father  . Diabetes type II Father  . Heart disease Father  . COPD Father   Review of Systems   Review of Systems: The patient denies chest pain, reports mild exertional shortness of breath, without orthopnea, paroxysmal nocturnal dyspnea, with intermittent pedal edema, without palpitations, heart racing, presyncope, syncope. Review of 10 Systems is negative except as described above.  Physical Examination   Vitals: BP 100/70  Pulse 56  Ht 193 cm (6\' 4" )  Wt (!) 105.1 kg (231 lb 9.6 oz)  SpO2 93%  BMI 28.19 kg/m  Ht:193 cm (6\' 4" ) Wt:(!) 105.1 kg (231 lb 9.6 oz) YTK:ZSWF surface area is 2.37 meters squared. Body mass index is 28.19 kg/m.  General: Alert and oriented. Well-appearing. No acute distress. HEENT: Pupils equally reactive to light and accomodation  Neck: Supple, no JVD Lungs: Normal effort of breathing; clear to auscultation bilaterally; no wheezes, rales, rhonchi Heart: bradycardic, irregularly irregular. No murmur, rub, or gallop Abdomen: nondistended Extremities: no cyanosis, clubbing, or edema Peripheral Pulses: 2+ radial  Skin: Warm, dry, no diaphoresis  Assessment   68 y.o. male with  1. Essential hypertension  2. Persistent  atrial fibrillation (CMS-HCC)  3. OSA on CPAP  4. Pulmonary emphysema, unspecified emphysema type (CMS-HCC)  5. Decreased left ventricular function  6. Pure hypercholesterolemia   68 year old gentleman with history of mild nonischemic dilated cardiomyopathy, overall appears clinically stable, with mild exertional dyspnea and mild peripheral edema. Patient has essential hypertension, blood pressure low today,  though the patient appears asymptomatic. The patient has hyperlipidemia with elevated LDL cholesterol on diet therapy. He was incidentally noted to be in atrial fibrillation with slow ventricular response at a recent visit for preoperative cardiovascular evaluation prior to laparoscopic cholecystectomy. 72-hr Holter monitor revealed sustained atrial fibrillation with an average rate of 60 bpm. The patient appears asymptomatic of both atrial fibrillation and bradycardia. The patient was started on Eliquis for stroke prevention, which appears to be well tolerated.  Plan   1. Continue current medications 2. Counseled patient about low-sodium diet 3. DASH diet printed instructions given to the patient 4. Continue propanolol at this time 5. Return to clinic or telemedicine in 4 months, sooner if needed  No orders of the defined types were placed in this encounter.  Return in about 4 months (around 05/22/2019). I personally performed the service, non-incident to. (WP)  ANNA MARIA DRANE, PA-C  The patient was also evaluated by Dr. Saralyn Pilar, and the plan was made in collaboration with him.  Electronically signed by Billy Freeman, [place] at 01/20/2019 5:15 PM EDT

## 2019-01-28 ENCOUNTER — Other Ambulatory Visit: Payer: Self-pay

## 2019-01-28 ENCOUNTER — Other Ambulatory Visit
Admission: RE | Admit: 2019-01-28 | Discharge: 2019-01-28 | Disposition: A | Discharge status: Home / Self Care | Payer: Medicare Other | Source: Ambulatory Visit | Attending: Surgery | Admitting: Surgery

## 2019-01-28 DIAGNOSIS — Z01812 Encounter for preprocedural laboratory examination: Secondary | ICD-10-CM | POA: Diagnosis not present

## 2019-01-28 LAB — SARS CORONAVIRUS 2 (TAT 6-24 HRS): SARS Coronavirus 2: NEGATIVE

## 2019-01-31 MED ORDER — CEFAZOLIN SODIUM-DEXTROSE 2-4 GM/100ML-% IV SOLN
2.0000 g | Freq: Once | INTRAVENOUS | Status: AC
  Administered 2019-02-01: 2 g via INTRAVENOUS

## 2019-01-31 NOTE — Pre-Procedure Instructions (Signed)
Progress Notes - documented in this encounter Billy Freeman, [name] - 01/20/2019 9:30 AM EDT Formatting of this note might be different from the original. Established Patient Visit   Chief Complaint: Chief Complaint  Patient presents with  . Follow-up  3 months-shoulder surgery july 7 dr poggi  Date of Service: 01/20/2019 Date of Birth: 1951-07-16 PCP: Azzie Glatter, MD  History of Present Illness: [name] is a 68 y.o.male patient who returns for  1. Mild nonischemic cardiomyopathy 2. Essential hypertension 3. Hyperlipidemia 4. COPD 5. Obstructive sleep apnea 6. Right lower lobe pulmonary embolus, bilateral DVT 09/19/2016 7. Atrial fibrillation  The patient was incidentally noted to be in atrial fibrillation at a recent office visit on 10/05/2018 for preoperative cardiovascular evaluation prior to consideration of laparoscopic cholecystectomy and cataract surgery. ECG revaled atrial fibrillation with slow ventricular response at a rate of 49 bpm with low voltage. He wore a 72-hour Holter monitor which revealed sustained atrial fibrillation with an average heart rate of 60 bpm, minimum heart rate 32 bpm, and maximum heart rate 95 bpm, with rare premature ventricular contractions. Due to being asymptomatic, denying palpitations or heart racing, his propanolol (used for tremor) was continued. He was started on Eliquis for stroke prevention. TSH on 07/08/2018 was within normal range. BUN, creatinine and GFR were 25, 0.9, and 84, respectively, on 07/30/2018. 2D echocardiogram on 01/18/2018 revealed normal left ventricular function, with LVEF 55 to 60% with mild mitral regurgitation. Repeat 2D echocardiogram on 11/04/2018 revealed moderately reduced left ventricular function with LVEF 40-45%, biatrial enlargement, and mild to moderate mitral and tricuspid regurgitation. Past echocardiograms have revealed LVEF 45%.   The patient returns today for follow-up and reports doing well from a  cardiovascular perspective. The patient is scheduled for shoulder surgery on July 7. He denies chest pain. He denies palpitations or heart racing. He has mild exertional shortness of breath, followed by Dr. Raul Del. He has mild intermittent peripheral edema. He denies lightheadedness, dizziness, or significant fatigue. He denies presyncope or syncope. 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 He is compliant with his CPAP nightly.   The patient has atrial fibrillation, as stated above, with a chads vasc score of 4 (age, hypertension, history of thromboembolism), on Eliquis for stroke prevention, which is well tolerated without significant bleeding side effects, with easy bleeding.  The patient has essential hypertension, blood pressure low today, currently on propanolol (for tremor), and indapamide every other day, which are well tolerated without apparent side effects. He is asymptomatic,and reports that he feels well. The patient follows a low-sodium, no added salt diet.  The patient has hyperlipidemia, LDL cholesterol is 134 on 07/08/18, which is improved from previous, currently on diet therapy, followed by his primary care provider. The patient follows a low-cholesterol, low-fat diet.  Past Medical and Surgical History  Past Medical History Past Medical History:  Diagnosis Date  . Atrial fibrillation (CMS-HCC) June 2019  . Basal cell carcinoma  resected on nose, Dr. Katharina Caper in [place]  . Benign essential tremor  . COPD (chronic obstructive pulmonary disease) (CMS-HCC)  . Decreased left ventricular function  mildly  . Depression  . Diabetes mellitus type 2, uncomplicated (CMS-HCC)  . Diverticulosis  colon polyps on colonoscopy  . Fatty liver 2006  presumed due to alcoholic hepatitis status post liver biopsy  . GERD (gastroesophageal reflux disease)  . Glaucoma (increased eye pressure)  Hx of borderline  . Gout  .  Hyperlipidemia  .  Hypertension  . Insomnia  . Pulmonary embolism (CMS-HCC) FEB 2017  . Sleep apnea  on CPAP   Past Surgical History He has a past surgical history that includes Bunion Correction (Bilateral); liver biopsy (2006 and 2010); Knee arthroscopy (Bilateral); Knee arthroscopy (Right, 10/15/2012); Colonoscopy (10/17/2010); Colonoscopy (01/08/2016); and Bronchoscopy (Early 2019).   Medications and Allergies  Current Medications  Current Outpatient Medications  Medication Sig Dispense Refill  . albuterol (PROVENTIL) 2.5 mg /3 mL (0.083 %) nebulizer solution Take 3 mLs (2.5 mg total) by nebulization 4 (four) times daily DIAGNOSIS J44.9 (Patient not taking: Reported on 01/07/2019 ) 75 mL 11  . allopurinoL (ZYLOPRIM) 100 MG tablet TAKE 1 TABLET BY MOUTH EVERY DAY 30 tablet 5  . apixaban (ELIQUIS) 5 mg tablet Take 1 tablet (5 mg total) by mouth every 12 (twelve) hours 180 tablet 3  . citalopram (CELEXA) 20 MG tablet Take 20 mg by mouth once daily.  . clindamycin (CLEOCIN T) 1 % topical solution Cleocin T 1 % solution APPLY A THIN LAYER TO THE AFFECTED AREA(S) BY TOPICAL ROUTE 2 TIMES PER DAY  . cyanocobalamin (VITAMIN B12) 1000 MCG tablet Take 1,000 mcg by mouth once daily  . indapamide (LOZOL) 2.5 MG tablet Take 2.5 mg by mouth every other day  . miscellaneous medical supply Misc INCENTIVE SPIROMETER - USE AS DIRECTED 1 each 1  . omega-3 fatty acids/fish oil 340-1,000 mg capsule Take 1 capsule by mouth 2 (two) times daily  . propranolol (INDERAL) 20 MG tablet TAKE 1 TABLET BY MOUTH TWICE A DAY 180 tablet 1  . tadalafil (CIALIS) 5 MG tablet 2 tabs as needed prior to sexual activity 30 tablet 5  . temazepam (RESTORIL) 15 mg capsule Take 1 capsule (15 mg total) by mouth nightly as needed for Sleep 30 capsule 1   No current facility-administered medications for this visit.   Allergies: Oxycodone  Social and Family History  Social History reports that he has never smoked. He has never used smokeless  tobacco. He reports previous alcohol use. He reports previous drug use. Frequency: 7.00 times per week. Drug: Marijuana.  Family History Family History  Problem Relation Age of Onset  . High blood pressure (Hypertension) Mother  . Heart disease Mother  . Cancer Mother  . Emphysema Father  . High blood pressure (Hypertension) Father  . Diabetes type II Father  . Heart disease Father  . COPD Father   Review of Systems   Review of Systems: The patient denies chest pain, reports mild exertional shortness of breath, without orthopnea, paroxysmal nocturnal dyspnea, with intermittent pedal edema, without palpitations, heart racing, presyncope, syncope. Review of 10 Systems is negative except as described above.  Physical Examination   Vitals: BP 100/70  Pulse 56  Ht 193 cm (6\' 4" )  Wt (!) 105.1 kg (231 lb 9.6 oz)  SpO2 93%  BMI 28.19 kg/m  Ht:193 cm (6\' 4" ) Wt:(!) 105.1 kg (231 lb 9.6 oz) PZW:CHEN surface area is 2.37 meters squared. Body mass index is 28.19 kg/m.  General: Alert and oriented. Well-appearing. No acute distress. HEENT: Pupils equally reactive to light and accomodation  Neck: Supple, no JVD Lungs: Normal effort of breathing; clear to auscultation bilaterally; no wheezes, rales, rhonchi Heart: bradycardic, irregularly irregular. No murmur, rub, or gallop Abdomen: nondistended Extremities: no cyanosis, clubbing, or edema Peripheral Pulses: 2+ radial  Skin: Warm, dry, no diaphoresis  Assessment   68 y.o. male with  1. Essential hypertension  2. Persistent  atrial fibrillation (CMS-HCC)  3. OSA on CPAP  4. Pulmonary emphysema, unspecified emphysema type (CMS-HCC)  5. Decreased left ventricular function  6. Pure hypercholesterolemia   68 year old gentleman with history of mild nonischemic dilated cardiomyopathy, overall appears clinically stable, with mild exertional dyspnea and mild peripheral edema. Patient has essential hypertension, blood pressure low today,  though the patient appears asymptomatic. The patient has hyperlipidemia with elevated LDL cholesterol on diet therapy. He was incidentally noted to be in atrial fibrillation with slow ventricular response at a recent visit for preoperative cardiovascular evaluation prior to laparoscopic cholecystectomy. 72-hr Holter monitor revealed sustained atrial fibrillation with an average rate of 60 bpm. The patient appears asymptomatic of both atrial fibrillation and bradycardia. The patient was started on Eliquis for stroke prevention, which appears to be well tolerated.  Plan   1. Continue current medications 2. Counseled patient about low-sodium diet 3. DASH diet printed instructions given to the patient 4. Continue propanolol at this time 5. Return to clinic or telemedicine in 4 months, sooner if needed  No orders of the defined types were placed in this encounter.  Return in about 4 months (around 05/22/2019). I personally performed the service, non-incident to. (WP)  ANNA MARIA DRANE, PA-C  The patient was also evaluated by Dr. Saralyn Pilar, and the plan was made in collaboration with him.  Electronically signed by Billy Freeman, [place] at 01/20/2019 5:15 PM EDT

## 2019-02-01 ENCOUNTER — Ambulatory Visit
Admission: RE | Admit: 2019-02-01 | Discharge: 2019-02-01 | Disposition: A | Discharge status: Home / Self Care | Payer: Medicare Other | Attending: Surgery | Admitting: Surgery

## 2019-02-01 ENCOUNTER — Encounter
Admission: RE | Disposition: A | Discharge status: Home / Self Care | Payer: Self-pay | Source: Home / Self Care | Attending: Surgery

## 2019-02-01 ENCOUNTER — Ambulatory Visit: Payer: Medicare Other | Admitting: Certified Registered"

## 2019-02-01 ENCOUNTER — Other Ambulatory Visit: Payer: Self-pay

## 2019-02-01 DIAGNOSIS — I4891 Unspecified atrial fibrillation: Secondary | ICD-10-CM | POA: Diagnosis not present

## 2019-02-01 DIAGNOSIS — Z7901 Long term (current) use of anticoagulants: Secondary | ICD-10-CM | POA: Insufficient documentation

## 2019-02-01 DIAGNOSIS — J449 Chronic obstructive pulmonary disease, unspecified: Secondary | ICD-10-CM | POA: Insufficient documentation

## 2019-02-01 DIAGNOSIS — I10 Essential (primary) hypertension: Secondary | ICD-10-CM | POA: Insufficient documentation

## 2019-02-01 DIAGNOSIS — M7521 Bicipital tendinitis, right shoulder: Secondary | ICD-10-CM | POA: Insufficient documentation

## 2019-02-01 DIAGNOSIS — S46011A Strain of muscle(s) and tendon(s) of the rotator cuff of right shoulder, initial encounter: Secondary | ICD-10-CM | POA: Diagnosis present

## 2019-02-01 DIAGNOSIS — Z7951 Long term (current) use of inhaled steroids: Secondary | ICD-10-CM | POA: Diagnosis not present

## 2019-02-01 DIAGNOSIS — W19XXXA Unspecified fall, initial encounter: Secondary | ICD-10-CM | POA: Insufficient documentation

## 2019-02-01 DIAGNOSIS — E785 Hyperlipidemia, unspecified: Secondary | ICD-10-CM | POA: Insufficient documentation

## 2019-02-01 DIAGNOSIS — H42 Glaucoma in diseases classified elsewhere: Secondary | ICD-10-CM | POA: Insufficient documentation

## 2019-02-01 DIAGNOSIS — Z79899 Other long term (current) drug therapy: Secondary | ICD-10-CM | POA: Insufficient documentation

## 2019-02-01 DIAGNOSIS — G4730 Sleep apnea, unspecified: Secondary | ICD-10-CM | POA: Insufficient documentation

## 2019-02-01 DIAGNOSIS — E1139 Type 2 diabetes mellitus with other diabetic ophthalmic complication: Secondary | ICD-10-CM | POA: Insufficient documentation

## 2019-02-01 DIAGNOSIS — M109 Gout, unspecified: Secondary | ICD-10-CM | POA: Insufficient documentation

## 2019-02-01 DIAGNOSIS — F329 Major depressive disorder, single episode, unspecified: Secondary | ICD-10-CM | POA: Insufficient documentation

## 2019-02-01 DIAGNOSIS — M25811 Other specified joint disorders, right shoulder: Secondary | ICD-10-CM | POA: Insufficient documentation

## 2019-02-01 HISTORY — PX: SHOULDER ARTHROSCOPY WITH ROTATOR CUFF REPAIR AND SUBACROMIAL DECOMPRESSION: SHX5686

## 2019-02-01 LAB — URINE DRUG SCREEN, QUALITATIVE (ARMC ONLY)
Amphetamines, Ur Screen: NOT DETECTED
Barbiturates, Ur Screen: NOT DETECTED
Benzodiazepine, Ur Scrn: POSITIVE — AB
Cannabinoid 50 Ng, Ur ~~LOC~~: POSITIVE — AB
Cocaine Metabolite,Ur ~~LOC~~: NOT DETECTED
MDMA (Ecstasy)Ur Screen: NOT DETECTED
Methadone Scn, Ur: NOT DETECTED
Opiate, Ur Screen: NOT DETECTED
Phencyclidine (PCP) Ur S: NOT DETECTED
Tricyclic, Ur Screen: NOT DETECTED

## 2019-02-01 SURGERY — SHOULDER ARTHROSCOPY WITH ROTATOR CUFF REPAIR AND SUBACROMIAL DECOMPRESSION
Anesthesia: General | Laterality: Right

## 2019-02-01 MED ORDER — ONDANSETRON HCL 4 MG/2ML IJ SOLN: 4.0000 mg | Freq: Once | INTRAMUSCULAR | Status: DC | PRN

## 2019-02-01 MED ORDER — EPINEPHRINE PF 1 MG/ML IJ SOLN
INTRAMUSCULAR | Status: AC
  Filled 2019-02-01: qty 2

## 2019-02-01 MED ORDER — ONDANSETRON HCL 4 MG PO TABS: 4.0000 mg | ORAL_TABLET | Freq: Four times a day (QID) | ORAL | Status: DC | PRN

## 2019-02-01 MED ORDER — BUPIVACAINE-EPINEPHRINE (PF) 0.5% -1:200000 IJ SOLN
INTRAMUSCULAR | Status: AC
  Filled 2019-02-01: qty 30

## 2019-02-01 MED ORDER — SEVOFLURANE IN SOLN
RESPIRATORY_TRACT | Status: AC
  Filled 2019-02-01: qty 250

## 2019-02-01 MED ORDER — BUPIVACAINE LIPOSOME 1.3 % IJ SUSP
INTRAMUSCULAR | Status: DC | PRN
  Administered 2019-02-01: 20 mL via PERINEURAL

## 2019-02-01 MED ORDER — ONDANSETRON HCL 4 MG/2ML IJ SOLN
INTRAMUSCULAR | Status: DC | PRN
  Administered 2019-02-01: 4 mg via INTRAVENOUS

## 2019-02-01 MED ORDER — MIDAZOLAM HCL 2 MG/2ML IJ SOLN
1.0000 mg | Freq: Once | INTRAMUSCULAR | Status: AC
  Administered 2019-02-01: 1 mg via INTRAVENOUS

## 2019-02-01 MED ORDER — LACTATED RINGERS IV SOLN
INTRAVENOUS | Status: DC | PRN
  Administered 2019-02-01: 1 mL

## 2019-02-01 MED ORDER — PROPOFOL 10 MG/ML IV BOLUS
INTRAVENOUS | Status: AC
  Filled 2019-02-01: qty 40

## 2019-02-01 MED ORDER — PROPOFOL 10 MG/ML IV BOLUS
INTRAVENOUS | Status: DC | PRN
  Administered 2019-02-01: 150 mg via INTRAVENOUS
  Administered 2019-02-01: 30 mg via INTRAVENOUS

## 2019-02-01 MED ORDER — SUGAMMADEX SODIUM 200 MG/2ML IV SOLN
INTRAVENOUS | Status: DC | PRN
  Administered 2019-02-01: 200 mg via INTRAVENOUS

## 2019-02-01 MED ORDER — FAMOTIDINE 20 MG PO TABS
20.0000 mg | ORAL_TABLET | Freq: Once | ORAL | Status: AC
  Administered 2019-02-01: 09:00:00 20 mg via ORAL

## 2019-02-01 MED ORDER — FENTANYL CITRATE (PF) 100 MCG/2ML IJ SOLN
INTRAMUSCULAR | Status: AC
  Filled 2019-02-01: qty 2

## 2019-02-01 MED ORDER — BUPIVACAINE HCL (PF) 0.5 % IJ SOLN
INTRAMUSCULAR | Status: DC | PRN
  Administered 2019-02-01: 10 mL via PERINEURAL

## 2019-02-01 MED ORDER — LIDOCAINE HCL (PF) 1 % IJ SOLN
INTRAMUSCULAR | Status: AC
  Filled 2019-02-01: qty 5

## 2019-02-01 MED ORDER — BUPIVACAINE HCL (PF) 0.5 % IJ SOLN
INTRAMUSCULAR | Status: AC
  Filled 2019-02-01: qty 10

## 2019-02-01 MED ORDER — GLYCOPYRROLATE 0.2 MG/ML IJ SOLN
INTRAMUSCULAR | Status: DC | PRN
  Administered 2019-02-01: 0.2 mg via INTRAVENOUS

## 2019-02-01 MED ORDER — POTASSIUM CHLORIDE IN NACL 20-0.9 MEQ/L-% IV SOLN
INTRAVENOUS | Status: DC
  Filled 2019-02-01 (×3): qty 1000

## 2019-02-01 MED ORDER — FENTANYL CITRATE (PF) 100 MCG/2ML IJ SOLN
INTRAMUSCULAR | Status: DC | PRN
  Administered 2019-02-01 (×4): 50 ug via INTRAVENOUS

## 2019-02-01 MED ORDER — BUPIVACAINE-EPINEPHRINE 0.5% -1:200000 IJ SOLN
INTRAMUSCULAR | Status: DC | PRN
  Administered 2019-02-01: 30 mL

## 2019-02-01 MED ORDER — HYDROMORPHONE HCL 2 MG PO TABS: 2.0000 mg | ORAL_TABLET | ORAL | 0 refills | Status: DC | PRN

## 2019-02-01 MED ORDER — DEXAMETHASONE SODIUM PHOSPHATE 10 MG/ML IJ SOLN
INTRAMUSCULAR | Status: DC | PRN
  Administered 2019-02-01: 5 mg via INTRAVENOUS

## 2019-02-01 MED ORDER — ACETAMINOPHEN 325 MG PO TABS: 650.0000 mg | ORAL_TABLET | Freq: Once | ORAL | Status: DC | PRN

## 2019-02-01 MED ORDER — BUPIVACAINE LIPOSOME 1.3 % IJ SUSP
INTRAMUSCULAR | Status: AC
  Filled 2019-02-01: qty 20

## 2019-02-01 MED ORDER — MIDAZOLAM HCL 2 MG/2ML IJ SOLN
INTRAMUSCULAR | Status: AC
  Administered 2019-02-01: 1 mg via INTRAVENOUS
  Filled 2019-02-01: qty 2

## 2019-02-01 MED ORDER — CEFAZOLIN SODIUM-DEXTROSE 2-4 GM/100ML-% IV SOLN
INTRAVENOUS | Status: AC
  Filled 2019-02-01: qty 100

## 2019-02-01 MED ORDER — METOCLOPRAMIDE HCL 5 MG/ML IJ SOLN: 5.0000 mg | Freq: Three times a day (TID) | INTRAMUSCULAR | Status: DC | PRN

## 2019-02-01 MED ORDER — LIDOCAINE HCL (PF) 1 % IJ SOLN
INTRAMUSCULAR | Status: DC | PRN
  Administered 2019-02-01: .8 mL via SUBCUTANEOUS

## 2019-02-01 MED ORDER — FENTANYL CITRATE (PF) 100 MCG/2ML IJ SOLN
50.0000 ug | Freq: Once | INTRAMUSCULAR | Status: AC
  Administered 2019-02-01: 10:00:00 50 ug via INTRAVENOUS

## 2019-02-01 MED ORDER — FENTANYL CITRATE (PF) 100 MCG/2ML IJ SOLN: 25.0000 ug | INTRAMUSCULAR | Status: DC | PRN

## 2019-02-01 MED ORDER — ACETAMINOPHEN 160 MG/5ML PO SOLN
325.0000 mg | ORAL | Status: DC | PRN
  Filled 2019-02-01: qty 20.3

## 2019-02-01 MED ORDER — FENTANYL CITRATE (PF) 100 MCG/2ML IJ SOLN
INTRAMUSCULAR | Status: AC
  Administered 2019-02-01: 50 ug via INTRAVENOUS
  Filled 2019-02-01: qty 2

## 2019-02-01 MED ORDER — ONDANSETRON HCL 4 MG/2ML IJ SOLN: 4.0000 mg | Freq: Four times a day (QID) | INTRAMUSCULAR | Status: DC | PRN

## 2019-02-01 MED ORDER — FAMOTIDINE 20 MG PO TABS
ORAL_TABLET | ORAL | Status: AC
  Administered 2019-02-01: 09:00:00 20 mg via ORAL
  Filled 2019-02-01: qty 1

## 2019-02-01 MED ORDER — EPINEPHRINE PF 1 MG/ML IJ SOLN
INTRAMUSCULAR | Status: AC
  Filled 2019-02-01: qty 1

## 2019-02-01 MED ORDER — METOCLOPRAMIDE HCL 10 MG PO TABS: 5.0000 mg | ORAL_TABLET | Freq: Three times a day (TID) | ORAL | Status: DC | PRN

## 2019-02-01 MED ORDER — ROCURONIUM BROMIDE 100 MG/10ML IV SOLN
INTRAVENOUS | Status: DC | PRN
  Administered 2019-02-01: 50 mg via INTRAVENOUS

## 2019-02-01 MED ORDER — LIDOCAINE HCL (CARDIAC) PF 100 MG/5ML IV SOSY
PREFILLED_SYRINGE | INTRAVENOUS | Status: DC | PRN
  Administered 2019-02-01: 100 mg via INTRAVENOUS

## 2019-02-01 MED ORDER — LACTATED RINGERS IV SOLN
INTRAVENOUS | Status: DC
  Administered 2019-02-01 (×2): via INTRAVENOUS

## 2019-02-01 SURGICAL SUPPLY — 47 items
ANCHOR BONE REGENETEN (Anchor) ×1 IMPLANT
ANCHOR JUGGERKNOT WTAP NDL 2.9 (Anchor) ×3 IMPLANT
ANCHOR SUT QUATTRO KNTLS 4.5 (Anchor) ×2 IMPLANT
ANCHOR TENDON REGENETEN (Staple) ×1 IMPLANT
BIT DRILL JUGRKNT W/NDL BIT2.9 (DRILL) IMPLANT
BLADE FULL RADIUS 3.5 (BLADE) ×2 IMPLANT
BUR ACROMIONIZER 4.0 (BURR) ×2 IMPLANT
CANNULA SHAVER 8MMX76MM (CANNULA) ×2 IMPLANT
CHLORAPREP W/TINT 26 (MISCELLANEOUS) ×2 IMPLANT
COVER MAYO STAND STRL (DRAPES) ×2 IMPLANT
COVER WAND RF STERILE (DRAPES) ×2 IMPLANT
DRAPE IMP U-DRAPE 54X76 (DRAPES) ×4 IMPLANT
DRILL JUGGERKNOT W/NDL BIT 2.9 (DRILL) ×2
ELECT REM PT RETURN 9FT ADLT (ELECTROSURGICAL) ×2
ELECTRODE REM PT RTRN 9FT ADLT (ELECTROSURGICAL) ×1 IMPLANT
GAUZE SPONGE 4X4 12PLY STRL (GAUZE/BANDAGES/DRESSINGS) ×2 IMPLANT
GAUZE XEROFORM 1X8 LF (GAUZE/BANDAGES/DRESSINGS) ×2 IMPLANT
GLOVE BIO SURGEON STRL SZ7.5 (GLOVE) ×4 IMPLANT
GLOVE BIO SURGEON STRL SZ8 (GLOVE) ×4 IMPLANT
GLOVE BIOGEL PI IND STRL 8 (GLOVE) ×1 IMPLANT
GLOVE BIOGEL PI INDICATOR 8 (GLOVE) ×1
GLOVE INDICATOR 8.0 STRL GRN (GLOVE) ×2 IMPLANT
GOWN STRL REUS W/ TWL LRG LVL3 (GOWN DISPOSABLE) ×1 IMPLANT
GOWN STRL REUS W/ TWL XL LVL3 (GOWN DISPOSABLE) ×1 IMPLANT
GOWN STRL REUS W/TWL LRG LVL3 (GOWN DISPOSABLE) ×1
GOWN STRL REUS W/TWL XL LVL3 (GOWN DISPOSABLE) ×1
GRASPER SUT 15 45D LOW PRO (SUTURE) IMPLANT
IMPL REGENETEN MEDIUM (Shoulder) IMPLANT
IMPLANT REGENETEN MEDIUM (Shoulder) ×2 IMPLANT
IV LACTATED RINGER IRRG 3000ML (IV SOLUTION) ×2
IV LR IRRIG 3000ML ARTHROMATIC (IV SOLUTION) ×2 IMPLANT
MANIFOLD NEPTUNE II (INSTRUMENTS) ×2 IMPLANT
MASK FACE SPIDER DISP (MASK) ×2 IMPLANT
MAT ABSORB  FLUID 56X50 GRAY (MISCELLANEOUS) ×1
MAT ABSORB FLUID 56X50 GRAY (MISCELLANEOUS) ×1 IMPLANT
PACK ARTHROSCOPY SHOULDER (MISCELLANEOUS) ×2 IMPLANT
SLING ARM LRG DEEP (SOFTGOODS) ×2 IMPLANT
SLING ULTRA II LG (MISCELLANEOUS) ×2 IMPLANT
STAPLER SKIN PROX 35W (STAPLE) ×2 IMPLANT
STRAP SAFETY 5IN WIDE (MISCELLANEOUS) ×2 IMPLANT
SUT ETHIBOND 0 MO6 C/R (SUTURE) ×2 IMPLANT
SUT VIC AB 2-0 CT1 27 (SUTURE) ×2
SUT VIC AB 2-0 CT1 TAPERPNT 27 (SUTURE) ×2 IMPLANT
TAPE MICROFOAM 4IN (TAPE) ×2 IMPLANT
TUBING ARTHRO INFLOW-ONLY STRL (TUBING) ×2 IMPLANT
TUBING CONNECTING 10 (TUBING) ×2 IMPLANT
WAND WEREWOLF FLOW 90D (MISCELLANEOUS) ×2 IMPLANT

## 2019-02-01 NOTE — Op Note (Addendum)
02/01/2019  12:27 PM  Patient:   Billy Freeman.  Pre-Op Diagnosis:   Traumatic full-thickness rotator cuff tear with biceps tendinopathy and labral fraying, right shoulder.  Post-Op Diagnosis:   Impingement/tendinopathy with traumatic full-thickness rotator cuff tear, labral fraying, and biceps tendinopathy, right shoulder.  Procedure:   Extensive arthroscopic debridement, arthroscopic subacromial decompression, mini-open rotator cuff repair supplemented by a [employer] Regeneten patch, and mini-open biceps tenodesis, right shoulder.  Anesthesia:   General endotracheal with interscalene block using Exparel placed preoperatively by the anesthesiologist.  Surgeon:   Pascal Lux, MD  Assistant:   Cameron Proud, PA-C  Findings:   As above. There was moderate fraying of the superior portion of the labrum without frank detachment from the glenoid. Grade 1 chondromalacial changes were noted involving both the central portion of the glenoid and the central portion of the humeral head. It was an L-shaped full-thickness tear involving the entire insertion of the supraspinatus tendon and extending back into the anterior portion of the infraspinatus tendon. Moderate tendinopathy without tearing of the subscapularis tendon also was identified.  Complications:   None  Fluids:   1000 cc  Estimated blood loss:   15 cc  Tourniquet time:   None  Drains:   None  Closure:   Staples      Brief clinical note:   The patient is a 68 year old male with a 53-month history of right shoulder pain following an injury in which he tripped and fell while hunting. The patient's symptoms have progressed despite medications, activity modification, etc. The patient's history and examination are consistent with impingement/tendinopathy with a rotator cuff tear. These findings were confirmed by MRI scan. The patient presents at this time for definitive management of these shoulder symptoms.  Procedure:   The  patient underwent placement of an interscalene block using Exparel by the anesthesiologist in the preoperative holding area before being brought into the operating room and lain in the supine position. The patient then underwent general endotracheal intubation and anesthesia before being repositioned in the beach chair position using the beach chair positioner. The right shoulder and upper extremity were prepped with ChloraPrep solution before being draped sterilely. Preoperative antibiotics were administered. A timeout was performed to confirm the proper surgical site before the expected portal sites and incision site were injected with 0.5% Sensorcaine with epinephrine. A posterior portal was created and the glenohumeral joint thoroughly inspected with the findings as described above. An anterior portal was created using an outside-in technique. The labrum and rotator cuff were further probed, again confirming the above-noted findings. The areas of labral fraying were debrided back to stable margins using the full-radius resector. The torn margin of the rotator cuff also was debrided back to stable margins using the full-radius resector, as were areas of synovitis anteriorly and superiorly. The ArthroCare wand was inserted and used to release the biceps tendon from its labral anchor. It also was used to obtain hemostasis as well as to "anneal" the labrum superiorly and anteriorly. The instruments were removed from the joint after suctioning the excess fluid.  The camera was repositioned through the posterior portal into the subacromial space. A separate lateral portal was created using an outside-in technique. The 3.5 mm full-radius resector was introduced and used to perform a subtotal bursectomy. The ArthroCare wand was then inserted and used to remove the periosteal tissue off the undersurface of the anterior third of the acromion as well as to recess the coracoacromial ligament from its attachment  along the  anterior and lateral margins of the acromion. The 4.0 mm acromionizing bur was introduced and used to complete the decompression by removing the undersurface of the anterior third of the acromion. The full radius resector was reintroduced to remove any residual bony debris before the ArthroCare wand was reintroduced to obtain hemostasis. The instruments were then removed from the subacromial space after suctioning the excess fluid.  An approximately 4-5 cm incision was made over the anterolateral aspect of the shoulder beginning at the anterolateral corner of the acromion and extending distally in line with the bicipital groove. This incision was carried down through the subcutaneous tissues to expose the deltoid fascia. The raphae between the anterior and middle thirds was identified and this plane developed to provide access into the subacromial space. Additional bursal tissues were debrided sharply using Metzenbaum scissors. The rotator cuff tear was readily identified. The margins were debrided sharply with a #15 blade and the exposed greater tuberosity roughened with a rongeur. The portion of the tear extending medially into the infraspinatus tendon was repaired in a side-to-side fashion using several #0 Ethibond interrupted sutures. Laterally, the torn tendon was repaired using two Biomet 2.9 mm JuggerKnot anchors. These sutures were then brought back laterally and secured using two [place] QuatroLink anchors to create a two-layer closure. An apparent watertight closure was obtained.  Because the rotator cuff tendon tissue was quite tendinopathic, it was elected to apply a Orient patch over the repair in order to stimulate improved healing of the tendon tissue. This patch was secured using the appropriate soft tissue and bone staples.  The bicipital groove was identified by palpation and opened for 1-1.5 cm. The biceps tendon stump was retrieved through this defect. The floor of the  bicipital groove was roughened with a curet before another Biomet 2.9 mm JuggerKnot anchor was inserted. Both sets of sutures were passed through the biceps tendon and tied securely to effect the tenodesis. The bicipital sheath was reapproximated using two #0 Ethibond interrupted sutures, incorporating the biceps tendon to further reinforce the tenodesis.  The wound was copiously irrigated with sterile saline solution before the deltoid raphae was reapproximated using 2-0 Vicryl interrupted sutures. The subcutaneous tissues were closed in two layers using 2-0 Vicryl interrupted sutures before the skin was closed using staples. The portal sites also were closed using staples. A sterile bulky dressing was applied to the shoulder before the arm was placed into a shoulder immobilizer. The patient was then awakened, extubated, and returned to the recovery room in satisfactory condition after tolerating the procedure well.

## 2019-02-01 NOTE — Anesthesia Post-op Follow-up Note (Signed)
Anesthesia QCDR form completed.        

## 2019-02-01 NOTE — Transfer of Care (Signed)
Immediate Anesthesia Transfer of Care Note  Patient: Billy Freeman.  Procedure(s) Performed: SHOULDER ARTHROSCOPY WITH DEBRIDEMENT, DECOMPRESSION, AND ROTATOR CUFF REPAIR (Right )  Patient Location: PACU  Anesthesia Type:General  Level of Consciousness: awake, alert  and oriented  Airway & Oxygen Therapy: Patient Spontanous Breathing and Patient connected to nasal cannula oxygen  Post-op Assessment: Report given to RN and Post -op Vital signs reviewed and stable  Post vital signs: Reviewed and stable  Last Vitals:  Vitals Value Taken Time  BP    Temp    Pulse    Resp    SpO2      Last Pain:  Vitals:   02/01/19 0923  TempSrc:   PainSc: 0-No pain         Complications: No apparent anesthesia complications

## 2019-02-01 NOTE — Anesthesia Procedure Notes (Signed)
Procedure Name: Intubation Date/Time: 02/01/2019 10:45 AM Performed by: Chanetta Marshall, CRNA Pre-anesthesia Checklist: Patient identified, Emergency Drugs available, Suction available and Patient being monitored Patient Re-evaluated:Patient Re-evaluated prior to induction Oxygen Delivery Method: Circle system utilized Preoxygenation: Pre-oxygenation with 100% oxygen Induction Type: IV induction Ventilation: Mask ventilation without difficulty Laryngoscope Size: Mac and 3 Tube type: Oral Number of attempts: 1 Airway Equipment and Method: Stylet and Oral airway Placement Confirmation: ETT inserted through vocal cords under direct vision,  positive ETCO2,  breath sounds checked- equal and bilateral and CO2 detector Secured at: 23 cm Tube secured with: Tape Dental Injury: Teeth and Oropharynx as per pre-operative assessment

## 2019-02-01 NOTE — H&P (Signed)
Paper H&P to be scanned into permanent record. H&P reviewed and patient re-examined. No changes. 

## 2019-02-01 NOTE — Discharge Instructions (Addendum)
Interscalene Nerve Block with Exparel  1.  For your surgery you have received an Interscalene Nerve Block with Exparel. 2. Nerve Blocks affect many types of nerves, including nerves that control movement, pain and normal sensation.  You may experience feelings such as numbness, tingling, heaviness, weakness or the inability to move your arm or the feeling or sensation that your arm has "fallen asleep". 3. A nerve block with Exparel can last up to 5 days.  Usually the weakness wears off first.  The tingling and heaviness usually wear off next.  Finally you may start to notice pain.  Keep in mind that this may occur in any order.  Once a nerve block starts to wear off it is usually completely gone within 60 minutes. 4. ISNB may cause mild shortness of breath, a hoarse voice, blurry vision, unequal pupils, or drooping of the face on the same side as the nerve block.  These symptoms will usually resolve with the numbness.  Very rarely the procedure itself can cause mild seizures. 5. If needed, your surgeon will give you a prescription for pain medication.  It will take about 60 minutes for the oral pain medication to become fully effective.  So, it is recommended that you start taking this medication before the nerve block first begins to wear off, or when you first begin to feel discomfort. 6. Take your pain medication only as prescribed.  Pain medication can cause sedation and decrease your breathing if you take more than you need for the level of pain that you have. 7. Nausea is a common side effect of many pain medications.  You may want to eat something before taking your pain medicine to prevent nausea. 8. After an Interscalene nerve block, you cannot feel pain, pressure or extremes in temperature in the effected arm.  Because your arm is numb it is at an increased risk for injury.  To decrease the possibility of injury, please practice the following:  a. While you are awake change the position of  your arm frequently to prevent too much pressure on any one area for prolonged periods of time. b.  If you have a cast or tight dressing, check the color or your fingers every couple of hours.  Call your surgeon with the appearance of any discoloration (white or blue). c. If you are given a sling to wear before you go home, please wear it  at all times until the block has completely worn off.  Do not get up at night without your sling. d. Please contact [facility] or your surgeon if you do not begin to regain sensation after 7 days from the surgery.  Anesthesia may be contacted by calling the Same Day Surgery Department, Mon. through Fri., 6 am to 4 pm at [phone].   e. If you experience any other problems or concerns, please contact your surgeon's office. If you experience severe or prolonged shortness of breath go to the nearest emergency department.      Orthopedic discharge instructions: Keep dressing dry and intact.  May shower after dressing changed on post-op day #4 (Saturday).  Cover staples with Band-Aids after drying off. Apply ice frequently to shoulder. Resume Eliquis tomorrow morning. Take pain medication as prescribed when needed.  May supplement with ES Tylenol if necessary. Keep shoulder immobilizer on at all times except may remove for bathing purposes. Follow-up in 10-14 days or as scheduled.    AMBULATORY SURGERY  DISCHARGE INSTRUCTIONS   1) The drugs that  you were given will stay in your system until tomorrow so for the next 24 hours you should not:  A) Drive an automobile B) Make any legal decisions C) Drink any alcoholic beverage   2) You may resume regular meals tomorrow.  Today it is better to start with liquids and gradually work up to solid foods.  You may eat anything you prefer, but it is better to start with liquids, then soup and crackers, and gradually work up to solid foods.   3) Please notify your doctor immediately if you have any  unusual bleeding, trouble breathing, redness and pain at the surgery site, drainage, fever, or pain not relieved by medication.    4) Additional Instructions:        Please contact your physician with any problems or Same Day Surgery at [phone], Monday through Friday 6 am to 4 pm, or [place] at [facility] number at [phone].

## 2019-02-01 NOTE — Anesthesia Preprocedure Evaluation (Addendum)
Anesthesia Evaluation  Patient identified by MRN, date of birth, ID band Patient awake    Reviewed: Allergy & Precautions, NPO status , Patient's Chart, lab work & pertinent test results  History of Anesthesia Complications Negative for: history of anesthetic complications  Airway Mallampati: II       Dental   Pulmonary sleep apnea and Continuous Positive Airway Pressure Ventilation , COPD (mild),  COPD inhaler,           Cardiovascular hypertension, Pt. on medications (-) Past MI and (-) CHF + dysrhythmias Atrial Fibrillation (-) Valvular Problems/Murmurs     Neuro/Psych neg Seizures Depression    GI/Hepatic Neg liver ROS, GERD  Medicated and Controlled,  Endo/Other  neg diabetes  Renal/GU negative Renal ROS     Musculoskeletal   Abdominal   Peds  Hematology   Anesthesia Other Findings   Reproductive/Obstetrics                            Anesthesia Physical Anesthesia Plan  ASA: III  Anesthesia Plan: General   Post-op Pain Management: GA combined w/ Regional for post-op pain   Induction: Intravenous  PONV Risk Score and Plan: 2 and Dexamethasone and Ondansetron  Airway Management Planned: Oral ETT  Additional Equipment:   Intra-op Plan:   Post-operative Plan:   Informed Consent: I have reviewed the patients History and Physical, chart, labs and discussed the procedure including the risks, benefits and alternatives for the proposed anesthesia with the patient or authorized representative who has indicated his/her understanding and acceptance.       Plan Discussed with:   Anesthesia Plan Comments:         Anesthesia Quick Evaluation

## 2019-02-01 NOTE — Anesthesia Postprocedure Evaluation (Signed)
Anesthesia Post Note  Patient: Billy Freeman.  Procedure(s) Performed: SHOULDER ARTHROSCOPY WITH DEBRIDEMENT, DECOMPRESSION, AND ROTATOR CUFF REPAIR (Right )  Patient location during evaluation: PACU Anesthesia Type: General Level of consciousness: awake and alert and oriented Pain management: pain level controlled Vital Signs Assessment: post-procedure vital signs reviewed and stable Respiratory status: spontaneous breathing Cardiovascular status: blood pressure returned to baseline Anesthetic complications: no     Last Vitals:  Vitals:   02/01/19 1304 02/01/19 1314  BP: 103/72 104/67  Pulse: 70 65  Resp: 17 11  Temp:    SpO2: 93% 93%    Last Pain:  Vitals:   02/01/19 1304  TempSrc:   PainSc: 0-No pain                 Tamaj Jurgens

## 2019-02-02 ENCOUNTER — Encounter: Payer: Self-pay | Admitting: Surgery

## 2019-02-03 DIAGNOSIS — M24111 Other articular cartilage disorders, right shoulder: Secondary | ICD-10-CM | POA: Insufficient documentation

## 2019-02-04 ENCOUNTER — Encounter: Payer: Self-pay | Admitting: Surgery

## 2019-02-10 ENCOUNTER — Encounter: Payer: Self-pay | Admitting: Surgery

## 2019-03-25 DIAGNOSIS — M810 Age-related osteoporosis without current pathological fracture: Secondary | ICD-10-CM | POA: Insufficient documentation

## 2019-04-22 ENCOUNTER — Encounter: Payer: Self-pay | Admitting: Surgery

## 2019-05-04 ENCOUNTER — Encounter: Payer: Self-pay | Admitting: Podiatry

## 2019-05-04 ENCOUNTER — Ambulatory Visit (INDEPENDENT_AMBULATORY_CARE_PROVIDER_SITE_OTHER): Payer: Medicare Other | Admitting: Podiatry

## 2019-05-04 ENCOUNTER — Ambulatory Visit (INDEPENDENT_AMBULATORY_CARE_PROVIDER_SITE_OTHER): Payer: Medicare Other

## 2019-05-04 ENCOUNTER — Other Ambulatory Visit: Payer: Self-pay

## 2019-05-04 DIAGNOSIS — S92502A Displaced unspecified fracture of left lesser toe(s), initial encounter for closed fracture: Secondary | ICD-10-CM | POA: Diagnosis not present

## 2019-05-04 DIAGNOSIS — L603 Nail dystrophy: Secondary | ICD-10-CM | POA: Diagnosis not present

## 2019-05-04 NOTE — Progress Notes (Signed)
Subjective:  Patient ID: Billy Freeman., male    DOB: [birth year],  MRN: [id number] HPI Chief Complaint  Patient presents with  . Toe Pain    Patient presents today for continued swelling in left 5th toe from fracture.  He denies any pain, but states "it gets sore after I walk alot"  He also c/o nail fungus most toes bilat feet and dry skin to feet  . Nail Problem    68 y.o. male presents with the above complaint.   ROS: He denies fever chills nausea vomiting muscle aches pains calf pain back pain chest pain shortness of breath.  Past Medical History:  Diagnosis Date  . Cancer ([place])    SKIN  . Cirrhosis ([place])    POSSIBLE  . COPD (chronic obstructive pulmonary disease) ([place])   . Depression   . Dysrhythmia    a-fib  . Edema    right FEET/LEG  . GERD (gastroesophageal reflux disease)    h/o  . Heart murmur   . History of kidney stones   . HOH (hard of hearing)   . Hypertension   . Pneumonia    January/February 2020  . Pulmonary embolism ([place])   . Pulmonary nodules   . Reflux   . Rosacea   . Sleep apnea    cpap  . Tremors of nervous system   . Wears hearing aid    Past Surgical History:  Procedure Laterality Date  . ANTERIOR CRUCIATE LIGAMENT (ACL) REVISION    . broken nose surgery    . BUNIONECTOMY Bilateral   . CATARACT EXTRACTION W/PHACO Right 02/09/2018   Procedure: CATARACT EXTRACTION PHACO AND INTRAOCULAR LENS PLACEMENT (IOC);  Surgeon: Birder Robson, MD;  Location: [facility];  Service: Ophthalmology;  Laterality: Right;  [place] 00:45 AP% 14.4 CDE 6.58 Fluid pack lot # [id number]  . CATARACT EXTRACTION W/PHACO Left 12/14/2018   Procedure: CATARACT EXTRACTION PHACO AND INTRAOCULAR LENS PLACEMENT (IOC LEFT;  Surgeon: Birder Robson, MD;  Location: [facility];  Service: Ophthalmology;  Laterality: Left;  . FLEXIBLE BRONCHOSCOPY N/A 04/30/2017   Procedure: FLEXIBLE BRONCHOSCOPY;  Surgeon: Laverle Hobby, MD;  Location: [facility];  Service:  Pulmonary;  Laterality: N/A;  . KIDNEY STONE SURGERY    . MENISCUS REPAIR Bilateral    right knee x 3, left knee x 2  . SHOULDER ARTHROSCOPY WITH ROTATOR CUFF REPAIR AND SUBACROMIAL DECOMPRESSION Right 02/01/2019   Procedure: Extensive arthroscopic debridement, arthroscopic subacromial decompression, mini-open rotator cuff repair supplemented by a Tamala Julian [employer] Nephew Regeneten patch, and mini-open biceps tenodesis, right shoulder.;  Surgeon: Corky Mull, MD;  Location: [facility];  Service: Orthopedics;  Laterality: Right;  . TOE AMPUTATION Bilateral    index toes    Current Outpatient Medications:  .  allopurinol (ZYLOPRIM) 100 MG tablet, Take 100 mg by mouth every morning. , Disp: , Rfl:  .  apixaban (ELIQUIS) 5 MG TABS tablet, Take 1 tablet (5 mg total) by mouth 2 (two) times daily. Take 10 mg (two pills) twice daily for seven days, then take 61m (one pill) twice daily. (Patient taking differently: Take 5 mg by mouth 2 (two) times daily. ), Disp: 60 tablet, Rfl: 0 .  azithromycin (ZITHROMAX) 250 MG tablet, Take 250 mg by mouth every morning. rosacea, Disp: , Rfl:  .  budesonide-formoterol (SYMBICORT) 160-4.5 MCG/ACT inhaler, Inhale 2 puffs into the lungs 2 (two) times daily., Disp: , Rfl:  .  citalopram (CELEXA) 20 MG tablet, Take 20 mg by mouth every morning. ,  Disp: , Rfl:  .  clindamycin (CLEOCIN-T) 1 % external solution, Apply 1 application topically 2 (two) times daily as needed (for breakout). , Disp: , Rfl:  .  HYDROmorphone (DILAUDID) 2 MG tablet, Take 1-2 tablets (2-4 mg total) by mouth every 4 (four) hours as needed for severe pain., Disp: 40 tablet, Rfl: 0 .  indapamide (LOZOL) 2.5 MG tablet, Take 2.5 mg by mouth every other day. , Disp: , Rfl:  .  Omega-3 Fatty Acids (FISH OIL) 1200 MG CAPS, Take 1,200 mg by mouth 2 (two) times daily. , Disp: , Rfl:  .  propranolol (INDERAL) 20 MG tablet, Take 20 mg by mouth 2 (two) times daily. , Disp: , Rfl:  .  tadalafil (CIALIS) 5 MG tablet, Take 10  mg by mouth daily as needed for erectile dysfunction. , Disp: , Rfl:  .  temazepam (RESTORIL) 15 MG capsule, Take 15 mg by mouth at bedtime. , Disp: , Rfl:  .  vitamin B-12 (CYANOCOBALAMIN) 500 MCG tablet, Take 500 mcg by mouth daily., Disp: , Rfl:   Allergies  Allergen Reactions  . Oxycodone Itching   Review of Systems Objective:  There were no vitals filed for this visit.  General: Well developed, nourished, in no acute distress, alert and oriented x3   Dermatological: Skin is warm, dry and supple bilateral. Nails x 10 are well maintained; remaining integument appears unremarkable at this time. There are no open sores, no preulcerative lesions, no rash or signs of infection present.  Nails are slightly thickened particularly the hallux nail left with some striations.  Vascular: Dorsalis Pedis artery and Posterior Tibial artery pedal pulses are 2/4 bilateral with immedate capillary fill time. Pedal hair growth present. No varicosities and no lower extremity edema present bilateral.   Neruologic: Grossly intact via light touch bilateral. Vibratory intact via tuning fork bilateral. Protective threshold with Semmes Wienstein monofilament intact to all pedal sites bilateral. Patellar and Achilles deep tendon reflexes 2+ bilateral. No Babinski or clonus noted bilateral.   Musculoskeletal: No gross boney pedal deformities bilateral. No pain, crepitus, or limitation noted with foot and ankle range of motion bilateral. Muscular strength 5/5 in all groups tested bilateral.  Fifth toe left is still swollen mildly tender.  Gait: Unassisted, Nonantalgic.    Radiographs:  Slowly healing fracture fifth digit left foot.    Assessment & Plan:   Assessment: Possible onychomycosis left foot.  Nail dystrophy left foot.  Fracture fifth toe left foot healing.  Plan: Samples of the nail were taken today to be sent for pathologic evaluation we will follow-up with him once that returns.     Max T.  [place], [place]

## 2019-06-01 ENCOUNTER — Ambulatory Visit (INDEPENDENT_AMBULATORY_CARE_PROVIDER_SITE_OTHER): Payer: Medicare Other | Admitting: Podiatry

## 2019-06-01 ENCOUNTER — Encounter: Payer: Self-pay | Admitting: Podiatry

## 2019-06-01 ENCOUNTER — Other Ambulatory Visit: Payer: Self-pay

## 2019-06-01 DIAGNOSIS — L603 Nail dystrophy: Secondary | ICD-10-CM

## 2019-06-01 NOTE — Progress Notes (Signed)
He presents today for follow-up of his pathology result regarding his toenails.  Objective: Vital signs are stable he is alert and oriented x3.  Pathology result does demonstrate a saprophytic fungus.  Assessment: Saprophytic fungus.  Plan: Discussed oral therapy topical therapy and laser therapy.  At this point he set up laser therapy.

## 2019-07-08 ENCOUNTER — Ambulatory Visit: Payer: Self-pay | Admitting: *Deleted

## 2019-07-08 ENCOUNTER — Other Ambulatory Visit: Payer: Self-pay

## 2019-07-08 DIAGNOSIS — L603 Nail dystrophy: Secondary | ICD-10-CM

## 2019-07-08 DIAGNOSIS — B351 Tinea unguium: Secondary | ICD-10-CM

## 2019-07-08 NOTE — Progress Notes (Signed)
Patient presents today for the 1st laser treatment. Diagnosed with mycotic nail infection by Dr. Milinda Pointer. Toenail most affected are the hallux nails bilateral.  All other systems are negative.  Nails were filed thin. Laser therapy was administered to 1-5, with exception of the 2nd nails (due to amputations) toenails bilateral and patient tolerated the treatment well. All safety precautions were in place.    Follow up in 4 weeks for laser # 2.  Picture of nails taken today to document visual progress.

## 2019-07-08 NOTE — Patient Instructions (Signed)

## 2019-08-08 ENCOUNTER — Other Ambulatory Visit: Payer: Self-pay

## 2019-08-08 ENCOUNTER — Ambulatory Visit: Payer: Self-pay | Admitting: *Deleted

## 2019-08-08 DIAGNOSIS — B351 Tinea unguium: Secondary | ICD-10-CM

## 2019-08-08 DIAGNOSIS — L603 Nail dystrophy: Secondary | ICD-10-CM

## 2019-08-08 NOTE — Progress Notes (Signed)
Patient presents today for the 2nd laser treatment. Diagnosed with mycotic nail infection by Dr. Milinda Pointer. Toenail most affected are the hallux nails bilateral.   Hallux left is purplish in color and extremely cold.   All other systems are negative.  Nails were filed thin. Laser therapy was administered to 1-5, with exception of the 2nd nails (due to amputations) toenails bilateral and patient tolerated the treatment well. All safety precautions were in place.   Follow up in 4 weeks for laser # 3.

## 2019-08-22 ENCOUNTER — Other Ambulatory Visit: Payer: Self-pay | Admitting: Internal Medicine

## 2019-08-22 DIAGNOSIS — R1011 Right upper quadrant pain: Secondary | ICD-10-CM

## 2019-08-26 ENCOUNTER — Ambulatory Visit
Admission: RE | Admit: 2019-08-26 | Discharge: 2019-08-26 | Disposition: A | Discharge status: Home / Self Care | Payer: Medicare Other | Source: Ambulatory Visit | Attending: Internal Medicine | Admitting: Internal Medicine

## 2019-08-26 ENCOUNTER — Other Ambulatory Visit: Payer: Self-pay

## 2019-08-26 DIAGNOSIS — R1011 Right upper quadrant pain: Secondary | ICD-10-CM | POA: Diagnosis not present

## 2019-08-30 ENCOUNTER — Other Ambulatory Visit: Payer: Self-pay | Admitting: Internal Medicine

## 2019-08-30 DIAGNOSIS — K828 Other specified diseases of gallbladder: Secondary | ICD-10-CM

## 2019-08-30 DIAGNOSIS — K8060 Calculus of gallbladder and bile duct with cholecystitis, unspecified, without obstruction: Secondary | ICD-10-CM

## 2019-08-30 DIAGNOSIS — K7460 Unspecified cirrhosis of liver: Secondary | ICD-10-CM

## 2019-09-05 ENCOUNTER — Other Ambulatory Visit: Payer: Self-pay

## 2019-09-05 ENCOUNTER — Ambulatory Visit (INDEPENDENT_AMBULATORY_CARE_PROVIDER_SITE_OTHER): Payer: Medicare Other | Admitting: *Deleted

## 2019-09-05 DIAGNOSIS — L603 Nail dystrophy: Secondary | ICD-10-CM

## 2019-09-05 NOTE — Progress Notes (Signed)
Patient presents today for the 3rd laser treatment. Diagnosed with mycotic nail infection by Dr. Milinda Pointer. Toenail most affected are the hallux nails bilateral. Patient has thick, callused, cracking skin at the tip of the right hallux. There is a small area that is open.   All other systems are negative.  Nails were filed thin. Laser therapy was administered to 1-5, with exception of the 2nd nails (due to amputations) toenails bilateral and patient tolerated the treatment well. All safety precautions were in place.   Applied neosporin and bandaid to the tip of the right hallux and advised to watch for any signs of infection.  Follow up in 4 weeks for laser # 4.

## 2019-09-08 ENCOUNTER — Encounter
Admission: RE | Admit: 2019-09-08 | Discharge: 2019-09-08 | Disposition: A | Discharge status: Home / Self Care | Payer: Medicare Other | Source: Ambulatory Visit | Attending: Internal Medicine | Admitting: Internal Medicine

## 2019-09-08 ENCOUNTER — Other Ambulatory Visit: Payer: Self-pay

## 2019-09-08 DIAGNOSIS — K8060 Calculus of gallbladder and bile duct with cholecystitis, unspecified, without obstruction: Secondary | ICD-10-CM | POA: Insufficient documentation

## 2019-09-08 DIAGNOSIS — K828 Other specified diseases of gallbladder: Secondary | ICD-10-CM | POA: Insufficient documentation

## 2019-09-08 DIAGNOSIS — K7460 Unspecified cirrhosis of liver: Secondary | ICD-10-CM | POA: Diagnosis present

## 2019-09-08 MED ORDER — TECHNETIUM TC 99M MEBROFENIN IV KIT
5.1800 | PACK | Freq: Once | INTRAVENOUS | Status: AC | PRN
  Administered 2019-09-08: 5.18 via INTRAVENOUS

## 2019-09-28 ENCOUNTER — Ambulatory Visit: Payer: Self-pay | Admitting: General Surgery

## 2019-09-28 NOTE — H&P (View-Only) (Signed)
PATIENT PROFILE: Billy Freeman is a 69 y.o. male who presents to the Clinic for consultation at the request of Dr. Ginette Pitman for evaluation of cholelithiasis.  PCP:  Azzie Glatter, MD  HISTORY OF PRESENT ILLNESS: Billy Freeman was previously evaluated a year ago due to right upper quadrant pain with cholelithiasis.  At the moment patient was going through multiple recurrent pneumonic process being treated by pulmonology.  He had a complicated medical history at the moment and we elected to hold elective cholecystectomy until his pulmonary and cardiac conditions were optimized.  The patient has been adequately optimized from pulmonary standpoint and he was clear by Dr. Raul Del for any surgical intervention.  He also had COVID-19 virus infection that he recovered completely.  He denies shortness of breath or coughing.  He denies chest pain.  He is very active and able to meet 4 METS.  The patient reported he continued having daily right upper quadrant pain.  There have not been as severe as the one last year but they are significant enough to have that pain.  Pain localized to the right upper quadrant.  There is no pain radiation.  He identifies oral intake as aggravating factor.  He denies any alleviating factor.  He denies fever or chills.  Patient has had 2 ultrasound confirming cholelithiasis without gallbladder with thickening.  I personally evaluated the images.   PROBLEM LIST:         Problem List  Date Reviewed: 08/22/2019         Noted   Senile osteoporosis 03/25/2019   Degenerative tear of glenoid labrum of right shoulder 02/03/2019   Traumatic complete tear of right rotator cuff 01/07/2019   Rotator cuff tendinitis, right 01/07/2019   Tendinitis of upper biceps tendon of right shoulder 01/07/2019   Atrial fibrillation (CMS-HCC) 10/05/2018   Personal history of gout 07/14/2017   History of pulmonary embolism 07/14/2017   Closed compression fracture of L1 lumbar vertebra  (CMS-HCC) 07/15/2016   Impotence of organic origin 06/07/2014   Heart disease 06/06/2014   Overview    Overview:  Overview:  mildly      Decreased left ventricular function Unknown   Overview    mildly      Hyperlipidemia Unknown   HTN (hypertension) 01/02/2014   OSA on CPAP 01/02/2014   Depression 01/02/2014   Occasional tremors 01/02/2014   GERD (gastroesophageal reflux disease) 01/02/2014   COPD (chronic obstructive pulmonary disease) (CMS-HCC) 01/02/2014   Kidney stones 01/02/2014   Rosacea 01/02/2014   Esophageal reflux 01/02/2014   Essential hypertension 01/02/2014   Chronic obstructive pulmonary disease ([place]) 01/02/2014   Obstructive sleep apnea 01/02/2014   Calculus of kidney 11/23/2013   Hydronephrosis 12/12/6158   Renal colic 7/37/1062   Calculus of ureter 11/23/2013      GENERAL REVIEW OF SYSTEMS:   General ROS: negative for - chills, fatigue, fever, weight gain or weight loss Allergy and Immunology ROS: negative for - hives  Hematological and Lymphatic ROS: negative for - bleeding problems or bruising, negative for palpable nodes Endocrine ROS: negative for - heat or cold intolerance, hair changes Respiratory ROS: negative for - cough, shortness of breath or wheezing Cardiovascular ROS: no chest pain or palpitations GI ROS: negative for nausea, vomiting, diarrhea, constipation.  Positive for abdominal pain Musculoskeletal ROS: negative for - joint swelling or muscle pain Neurological ROS: negative for - confusion, syncope Dermatological ROS: negative for pruritus and rash Psychiatric: negative for anxiety, depression, difficulty sleeping and memory loss  MEDICATIONS: Current Medications        Current Outpatient Medications  Medication Sig Dispense Refill  . allopurinoL (ZYLOPRIM) 100 MG tablet TAKE 1 TABLET BY MOUTH EVERY DAY 90 tablet 1  . apixaban (ELIQUIS) 5 mg tablet Take 1 tablet (5 mg total) by mouth every 12 (twelve) hours 180 tablet 3   . azithromycin (ZITHROMAX) 250 MG tablet Take 250 mg by mouth once daily    . budesonide-formoteroL (SYMBICORT) 160-4.5 mcg/actuation inhaler Inhale 2 inhalations into the lungs 2 (two) times daily 3 Inhaler 3  . citalopram (CELEXA) 20 MG tablet Take 20 mg by mouth once daily.    . clindamycin (CLEOCIN T) 1 % topical solution Cleocin T 1 % solution  APPLY A THIN LAYER TO THE AFFECTED AREA(S) BY TOPICAL ROUTE 2 TIMES PER DAY    . cyanocobalamin (VITAMIN B12) 1000 MCG tablet Take 1,000 mcg by mouth once daily    . indapamide (LOZOL) 2.5 MG tablet TAKE 1 TABLET BY MOUTH EVERY DAY (Patient taking differently: every other day   ) 90 tablet 1  . miscellaneous medical supply Misc INCENTIVE SPIROMETER - USE AS DIRECTED 1 each 1  . omega-3 fatty acids/fish oil 340-1,000 mg capsule Take 1 capsule by mouth 2 (two) times daily       . propranoloL (INDERAL) 20 MG tablet TAKE 1 TABLET BY MOUTH TWICE A DAY 180 tablet 1  . tadalafil (CIALIS) 5 MG tablet 2 tabs as needed prior to sexual activity 30 tablet 5  . temazepam (RESTORIL) 15 mg capsule Take 1 capsule (15 mg total) by mouth nightly as needed for Sleep for up to 30 days 30 capsule 1   No current facility-administered medications for this visit.       ALLERGIES: Oxycodone  PAST MEDICAL HISTORY:     Past Medical History:  Diagnosis Date  . Atrial fibrillation (CMS-HCC) June 2019  . Basal cell carcinoma    resected on nose, Dr. Katharina Caper in [place]  . Benign essential tremor   . COPD (chronic obstructive pulmonary disease) (CMS-HCC)   . Decreased left ventricular function    mildly  . Depression   . Diabetes mellitus type 2, uncomplicated (CMS-HCC)   . Diverticulosis    colon polyps on colonoscopy  . Fatty liver 2006   presumed due to alcoholic hepatitis status post liver biopsy  . GERD (gastroesophageal reflux disease)   . Glaucoma (increased eye pressure)    Hx of borderline  . Gout   . Hyperlipidemia   .  Hypertension   . Insomnia   . Pulmonary embolism (CMS-HCC) FEB 2017  . Sleep apnea    on CPAP    PAST SURGICAL HISTORY:      Past Surgical History:  Procedure Laterality Date  . BRONCHOSCOPY  Early 2019  . BUNION CORRECTION Bilateral    S/P  . COLONOSCOPY  10/17/2010   08/11/2007, 06/17/2001; Adenomatous Polyps, FH Colon Polyps (Mother/Sister): [facility] 09/2015  . COLONOSCOPY  01/08/2016   Adenomatous Polyps, FH Colon Polyps (Mother): [facility] 12/2020  . extensive arthroscopic debridement arthroscopic subacromial decompression mini-open rotator cuff repair supplemente by a smith and nephew regeneten patch and mini-open biceps tenodesis, right shoulder  Right 02/01/2019  . KNEE ARTHROSCOPY Bilateral    x2 bilateral, total of 4  . KNEE ARTHROSCOPY Right 10/15/2012   Dr. Marry Guan, meniscus surgery  . LIVER BIOPSY  2006 and 2010     FAMILY HISTORY:      Family History  Problem Relation Age  of Onset  . High blood pressure (Hypertension) Mother   . Heart disease Mother   . Cancer Mother   . Emphysema Father   . High blood pressure (Hypertension) Father   . Diabetes type II Father   . Heart disease Father   . COPD Father   . Osteopenia Sister      SOCIAL HISTORY: Social History          Socioeconomic History  . Marital status: Married    Spouse name: Not on file  . Number of children: Not on file  . Years of education: Not on file  . Highest education level: Not on file  Occupational History  . Not on file  Social Needs  . Financial resource strain: Not on file  . Food insecurity    Worry: Not on file    Inability: Not on file  . Transportation needs    Medical: Not on file    Non-medical: Not on file  Tobacco Use  . Smoking status: Never Smoker  . Smokeless tobacco: Never Used  . Tobacco comment: uses edibles  Substance and Sexual Activity  . Alcohol use: Not Currently    Alcohol/week: 0.0 standard drinks    Comment: very  little beer  . Drug use: Not Currently    Frequency: 7.0 times per week    Types: Marijuana    Comment: pot only.  just edibles.  not smoking any longer.  Marland Kitchen Sexual activity: Defer    Partners: Female    Comment: wife  Other Topics Concern  . Not on file  Social History Narrative  . Not on file      PHYSICAL EXAM:    Vitals:   09/27/19 1033  BP: 108/73  Pulse: 56   Body mass index is 28 kg/m. Weight: (!) 104.3 kg (230 lb)   GENERAL: Alert, active, oriented x3  HEENT: Pupils equal reactive to light. Extraocular movements are intact. Sclera clear. Palpebral conjunctiva normal red color.Pharynx clear.  NECK: Supple with no palpable mass and no adenopathy.  LUNGS: Sound clear with no rales rhonchi or wheezes.  HEART: Regular rhythm S1 and S2 without murmur.  ABDOMEN: Soft and depressible, nontender with no palpable mass, no hepatomegaly.   EXTREMITIES: Well-developed well-nourished symmetrical with no dependent edema.  NEUROLOGICAL: Awake alert oriented, facial expression symmetrical, moving all extremities.  REVIEW OF DATA: I have reviewed the following data today:      Appointment on 08/16/2019  Component Date Value  . WBC (White Blood Cell Co* 08/16/2019 5.2   . RBC (Red Blood Cell Coun* 08/16/2019 5.03   . Hemoglobin 08/16/2019 17.0   . Hematocrit 08/16/2019 49.0   . MCV (Mean Corpuscular Vo* 08/16/2019 97.4   . MCH (Mean Corpuscular He* 08/16/2019 33.8*  . MCHC (Mean Corpuscular H* 08/16/2019 34.7   . Platelet Count 08/16/2019 145*  . RDW-CV (Red Cell Distrib* 08/16/2019 14.0   . MPV (Mean Platelet Volum* 08/16/2019 9.5   . Neutrophils 08/16/2019 2.56   . Lymphocytes 08/16/2019 1.98   . Monocytes 08/16/2019 0.54   . Eosinophils 08/16/2019 0.11   . Basophils 08/16/2019 0.04   . Neutrophil % 08/16/2019 48.9   . Lymphocyte % 08/16/2019 37.9   . Monocyte % 08/16/2019 10.3   . Eosinophil % 08/16/2019 2.1   . Basophil% 08/16/2019 0.8    . Immature Granulocyte % 08/16/2019 0.0   . Immature Granulocyte Cou* 08/16/2019 0.00   . Glucose 08/16/2019 87   . Sodium 08/16/2019  137   . Potassium 08/16/2019 4.0   . Chloride 08/16/2019 102   . Carbon Dioxide (CO2) 08/16/2019 28.0   . Urea Nitrogen (BUN) 08/16/2019 21   . Creatinine 08/16/2019 0.9   . Glomerular Filtration Ra* 08/16/2019 84   . Calcium 08/16/2019 8.9   . AST  08/16/2019 36   . ALT  08/16/2019 35   . Alk Phos (alkaline Phosp* 08/16/2019 77   . Albumin 08/16/2019 3.7   . Bilirubin, Total 08/16/2019 1.6*  . Protein, Total 08/16/2019 6.3   . A/G Ratio 08/16/2019 1.4   . Cholesterol, Total 08/16/2019 234*  . Triglyceride 08/16/2019 70   . HDL (High Density Lipopr* 08/16/2019 80.1*  . LDL Calculated 08/16/2019 140*  . VLDL Cholesterol 08/16/2019 14   . Cholesterol/HDL Ratio 08/16/2019 2.9   . Thyroid Stimulating Horm* 08/16/2019 2.168   . Color 08/17/2019 Yellow   . Clarity 08/17/2019 Clear   . Specific Gravity 08/17/2019 1.025   . pH, Urine 08/17/2019 6.0   . Protein, Urinalysis 08/17/2019 Negative   . Glucose, Urinalysis 08/17/2019 Negative   . Ketones, Urinalysis 08/17/2019 Negative   . Blood, Urinalysis 08/17/2019 Negative   . Nitrite, Urinalysis 08/17/2019 Negative   . Leukocyte Esterase, Urin* 08/17/2019 Negative   . White Blood Cells, Urina* 08/17/2019 0-3   . Red Blood Cells, Urinaly* 08/17/2019 None Seen   . Bacteria, Urinalysis 08/17/2019 None Seen   . Squamous Epithelial Cell* 08/17/2019 Rare   Appointment on 07/19/2019  Component Date Value  . SARS-COV-2, NAA - [facility] 07/19/2019 Detected*     ASSESSMENT: Billy Freeman is a 69 y.o. male presenting for consultation for cholelithiasis.    Patient was oriented about the diagnosis of cholelithiasis. Also oriented about what is the gallbladder, its anatomy and function and the implications of having stones. The patient was oriented about the treatment alternatives (observation vs cholecystectomy).  Patient was oriented that a low percentage of patient will continue to have similar pain symptoms even after the gallbladder is removed. Surgical technique (open vs laparoscopic) was discussed. It was also discussed the goals of the surgery (decrease the pain episodes and avoid the risk of cholecystitis) and the risk of surgery including: bleeding, infection, common bile duct injury, stone retention, injury to other organs such as bowel, liver, stomach, other complications such as hernia, bowel obstruction among others. Also discussed with patient about anesthesia and its complications such as: reaction to medications, pneumonia, heart complications, death, among others.  It is also important that this patient is currently optimized from his medical condition.  I think that is reasonable to proceed with elective cholecystectomy instead of waiting this patient to have an acute cholecystitis and needed emergent/urgent surgical intervention when he is on the anticoagulation versus needing percutaneous cholecystostomy and then interval cholecystectomy.  If he gets cleared by cardiology and he is able to hold his anticoagulation therapy I consider that this patient will be a reasonable risk for elective robotic assisted laparoscopic cholecystectomy.  He understand that he is a high risk of surgery but agreed to proceed.  Cholelithiasis without cholecystitis [K80.20]  PLAN: 1. Robotic assisted laparoscopic cholecystectomy (27517) 2.  CBC, CMP done 08/16/19 3.  Cardiology clearance for holding anticoagulation for at least 48 hours.  4.  Do not take aspirin 5 days before the procedure 5.  Contact us if has any question or concern.  Patient verbalized understanding, all questions were answered, and were agreeable with the plan outlined above.   I spent a  total of 40 minutes in both face-to-face and non-face-to-face activities for this visit on the date of this encounter.  Herbert Pun,  MD  Electronically signed by Herbert Pun, MD

## 2019-09-28 NOTE — H&P (Signed)
PATIENT PROFILE: Billy Freeman is a 69 y.o. male who presents to the Clinic for consultation at the request of Dr. Ginette Pitman for evaluation of cholelithiasis.  PCP:  Azzie Glatter, MD  HISTORY OF PRESENT ILLNESS: Mr. Fariss was previously evaluated a year ago due to right upper quadrant pain with cholelithiasis.  At the moment patient was going through multiple recurrent pneumonic process being treated by pulmonology.  He had a complicated medical history at the moment and we elected to hold elective cholecystectomy until his pulmonary and cardiac conditions were optimized.  The patient has been adequately optimized from pulmonary standpoint and he was clear by Dr. Raul Del for any surgical intervention.  He also had COVID-19 virus infection that he recovered completely.  He denies shortness of breath or coughing.  He denies chest pain.  He is very active and able to meet 4 METS.  The patient reported he continued having daily right upper quadrant pain.  There have not been as severe as the one last year but they are significant enough to have that pain.  Pain localized to the right upper quadrant.  There is no pain radiation.  He identifies oral intake as aggravating factor.  He denies any alleviating factor.  He denies fever or chills.  Patient has had 2 ultrasound confirming cholelithiasis without gallbladder with thickening.  I personally evaluated the images.   PROBLEM LIST:         Problem List  Date Reviewed: 08/22/2019         Noted   Senile osteoporosis 03/25/2019   Degenerative tear of glenoid labrum of right shoulder 02/03/2019   Traumatic complete tear of right rotator cuff 01/07/2019   Rotator cuff tendinitis, right 01/07/2019   Tendinitis of upper biceps tendon of right shoulder 01/07/2019   Atrial fibrillation (CMS-HCC) 10/05/2018   Personal history of gout 07/14/2017   History of pulmonary embolism 07/14/2017   Closed compression fracture of L1 lumbar vertebra  (CMS-HCC) 07/15/2016   Impotence of organic origin 06/07/2014   Heart disease 06/06/2014   Overview    Overview:  Overview:  mildly      Decreased left ventricular function Unknown   Overview    mildly      Hyperlipidemia Unknown   HTN (hypertension) 01/02/2014   OSA on CPAP 01/02/2014   Depression 01/02/2014   Occasional tremors 01/02/2014   GERD (gastroesophageal reflux disease) 01/02/2014   COPD (chronic obstructive pulmonary disease) (CMS-HCC) 01/02/2014   Kidney stones 01/02/2014   Rosacea 01/02/2014   Esophageal reflux 01/02/2014   Essential hypertension 01/02/2014   Chronic obstructive pulmonary disease ([place]) 01/02/2014   Obstructive sleep apnea 01/02/2014   Calculus of kidney 11/23/2013   Hydronephrosis 8/52/7782   Renal colic 11/18/5359   Calculus of ureter 11/23/2013      GENERAL REVIEW OF SYSTEMS:   General ROS: negative for - chills, fatigue, fever, weight gain or weight loss Allergy and Immunology ROS: negative for - hives  Hematological and Lymphatic ROS: negative for - bleeding problems or bruising, negative for palpable nodes Endocrine ROS: negative for - heat or cold intolerance, hair changes Respiratory ROS: negative for - cough, shortness of breath or wheezing Cardiovascular ROS: no chest pain or palpitations GI ROS: negative for nausea, vomiting, diarrhea, constipation.  Positive for abdominal pain Musculoskeletal ROS: negative for - joint swelling or muscle pain Neurological ROS: negative for - confusion, syncope Dermatological ROS: negative for pruritus and rash Psychiatric: negative for anxiety, depression, difficulty sleeping and memory loss  MEDICATIONS: Current Medications        Current Outpatient Medications  Medication Sig Dispense Refill  . allopurinoL (ZYLOPRIM) 100 MG tablet TAKE 1 TABLET BY MOUTH EVERY DAY 90 tablet 1  . apixaban (ELIQUIS) 5 mg tablet Take 1 tablet (5 mg total) by mouth every 12 (twelve) hours 180 tablet 3   . azithromycin (ZITHROMAX) 250 MG tablet Take 250 mg by mouth once daily    . budesonide-formoteroL (SYMBICORT) 160-4.5 mcg/actuation inhaler Inhale 2 inhalations into the lungs 2 (two) times daily 3 Inhaler 3  . citalopram (CELEXA) 20 MG tablet Take 20 mg by mouth once daily.    . clindamycin (CLEOCIN T) 1 % topical solution Cleocin T 1 % solution  APPLY A THIN LAYER TO THE AFFECTED AREA(S) BY TOPICAL ROUTE 2 TIMES PER DAY    . cyanocobalamin (VITAMIN B12) 1000 MCG tablet Take 1,000 mcg by mouth once daily    . indapamide (LOZOL) 2.5 MG tablet TAKE 1 TABLET BY MOUTH EVERY DAY (Patient taking differently: every other day   ) 90 tablet 1  . miscellaneous medical supply Misc INCENTIVE SPIROMETER - USE AS DIRECTED 1 each 1  . omega-3 fatty acids/fish oil 340-1,000 mg capsule Take 1 capsule by mouth 2 (two) times daily       . propranoloL (INDERAL) 20 MG tablet TAKE 1 TABLET BY MOUTH TWICE A DAY 180 tablet 1  . tadalafil (CIALIS) 5 MG tablet 2 tabs as needed prior to sexual activity 30 tablet 5  . temazepam (RESTORIL) 15 mg capsule Take 1 capsule (15 mg total) by mouth nightly as needed for Sleep for up to 30 days 30 capsule 1   No current facility-administered medications for this visit.       ALLERGIES: Oxycodone  PAST MEDICAL HISTORY:     Past Medical History:  Diagnosis Date  . Atrial fibrillation (CMS-HCC) June 2019  . Basal cell carcinoma    resected on nose, Dr. Katharina Caper in [place]  . Benign essential tremor   . COPD (chronic obstructive pulmonary disease) (CMS-HCC)   . Decreased left ventricular function    mildly  . Depression   . Diabetes mellitus type 2, uncomplicated (CMS-HCC)   . Diverticulosis    colon polyps on colonoscopy  . Fatty liver 2006   presumed due to alcoholic hepatitis status post liver biopsy  . GERD (gastroesophageal reflux disease)   . Glaucoma (increased eye pressure)    Hx of borderline  . Gout   . Hyperlipidemia   .  Hypertension   . Insomnia   . Pulmonary embolism (CMS-HCC) FEB 2017  . Sleep apnea    on CPAP    PAST SURGICAL HISTORY:      Past Surgical History:  Procedure Laterality Date  . BRONCHOSCOPY  Early 2019  . BUNION CORRECTION Bilateral    S/P  . COLONOSCOPY  10/17/2010   08/11/2007, 06/17/2001; Adenomatous Polyps, FH Colon Polyps (Mother/Sister): CBF 09/2015  . COLONOSCOPY  01/08/2016   Adenomatous Polyps, FH Colon Polyps (Mother): CBF 12/2020  . extensive arthroscopic debridement arthroscopic subacromial decompression mini-open rotator cuff repair supplemente by a smith and nephew regeneten patch and mini-open biceps tenodesis, right shoulder  Right 02/01/2019  . KNEE ARTHROSCOPY Bilateral    x2 bilateral, total of 4  . KNEE ARTHROSCOPY Right 10/15/2012   Dr. Marry Guan, meniscus surgery  . LIVER BIOPSY  2006 and 2010     FAMILY HISTORY:      Family History  Problem Relation Age  of Onset  . High blood pressure (Hypertension) Mother   . Heart disease Mother   . Cancer Mother   . Emphysema Father   . High blood pressure (Hypertension) Father   . Diabetes type II Father   . Heart disease Father   . COPD Father   . Osteopenia Sister      SOCIAL HISTORY: Social History          Socioeconomic History  . Marital status: Married    Spouse name: Not on file  . Number of children: Not on file  . Years of education: Not on file  . Highest education level: Not on file  Occupational History  . Not on file  Social Needs  . Financial resource strain: Not on file  . Food insecurity    Worry: Not on file    Inability: Not on file  . Transportation needs    Medical: Not on file    Non-medical: Not on file  Tobacco Use  . Smoking status: Never Smoker  . Smokeless tobacco: Never Used  . Tobacco comment: uses edibles  Substance and Sexual Activity  . Alcohol use: Not Currently    Alcohol/week: 0.0 standard drinks    Comment: very  little beer  . Drug use: Not Currently    Frequency: 7.0 times per week    Types: Marijuana    Comment: pot only.  just edibles.  not smoking any longer.  Marland Kitchen Sexual activity: Defer    Partners: Female    Comment: wife  Other Topics Concern  . Not on file  Social History Narrative  . Not on file      PHYSICAL EXAM:    Vitals:   09/27/19 1033  BP: 108/73  Pulse: 56   Body mass index is 28 kg/m. Weight: (!) 104.3 kg (230 lb)   GENERAL: Alert, active, oriented x3  HEENT: Pupils equal reactive to light. Extraocular movements are intact. Sclera clear. Palpebral conjunctiva normal red color.Pharynx clear.  NECK: Supple with no palpable mass and no adenopathy.  LUNGS: Sound clear with no rales rhonchi or wheezes.  HEART: Regular rhythm S1 and S2 without murmur.  ABDOMEN: Soft and depressible, nontender with no palpable mass, no hepatomegaly.   EXTREMITIES: Well-developed well-nourished symmetrical with no dependent edema.  NEUROLOGICAL: Awake alert oriented, facial expression symmetrical, moving all extremities.  REVIEW OF DATA: I have reviewed the following data today:      Appointment on 08/16/2019  Component Date Value  . WBC (White Blood Cell Co* 08/16/2019 5.2   . RBC (Red Blood Cell Coun* 08/16/2019 5.03   . Hemoglobin 08/16/2019 17.0   . Hematocrit 08/16/2019 49.0   . MCV (Mean Corpuscular Vo* 08/16/2019 97.4   . MCH (Mean Corpuscular He* 08/16/2019 33.8*  . MCHC (Mean Corpuscular H* 08/16/2019 34.7   . Platelet Count 08/16/2019 145*  . RDW-CV (Red Cell Distrib* 08/16/2019 14.0   . MPV (Mean Platelet Volum* 08/16/2019 9.5   . Neutrophils 08/16/2019 2.56   . Lymphocytes 08/16/2019 1.98   . Monocytes 08/16/2019 0.54   . Eosinophils 08/16/2019 0.11   . Basophils 08/16/2019 0.04   . Neutrophil % 08/16/2019 48.9   . Lymphocyte % 08/16/2019 37.9   . Monocyte % 08/16/2019 10.3   . Eosinophil % 08/16/2019 2.1   . Basophil% 08/16/2019 0.8    . Immature Granulocyte % 08/16/2019 0.0   . Immature Granulocyte Cou* 08/16/2019 0.00   . Glucose 08/16/2019 87   . Sodium 08/16/2019  137   . Potassium 08/16/2019 4.0   . Chloride 08/16/2019 102   . Carbon Dioxide (CO2) 08/16/2019 28.0   . Urea Nitrogen (BUN) 08/16/2019 21   . Creatinine 08/16/2019 0.9   . Glomerular Filtration Ra* 08/16/2019 84   . Calcium 08/16/2019 8.9   . AST  08/16/2019 36   . ALT  08/16/2019 35   . Alk Phos (alkaline Phosp* 08/16/2019 77   . Albumin 08/16/2019 3.7   . Bilirubin, Total 08/16/2019 1.6*  . Protein, Total 08/16/2019 6.3   . A/G Ratio 08/16/2019 1.4   . Cholesterol, Total 08/16/2019 234*  . Triglyceride 08/16/2019 70   . HDL (High Density Lipopr* 08/16/2019 80.1*  . LDL Calculated 08/16/2019 140*  . VLDL Cholesterol 08/16/2019 14   . Cholesterol/HDL Ratio 08/16/2019 2.9   . Thyroid Stimulating Horm* 08/16/2019 2.168   . Color 08/17/2019 Yellow   . Clarity 08/17/2019 Clear   . Specific Gravity 08/17/2019 1.025   . pH, Urine 08/17/2019 6.0   . Protein, Urinalysis 08/17/2019 Negative   . Glucose, Urinalysis 08/17/2019 Negative   . Ketones, Urinalysis 08/17/2019 Negative   . Blood, Urinalysis 08/17/2019 Negative   . Nitrite, Urinalysis 08/17/2019 Negative   . Leukocyte Esterase, Urin* 08/17/2019 Negative   . White Blood Cells, Urina* 08/17/2019 0-3   . Red Blood Cells, Urinaly* 08/17/2019 None Seen   . Bacteria, Urinalysis 08/17/2019 None Seen   . Squamous Epithelial Cell* 08/17/2019 Rare   Appointment on 07/19/2019  Component Date Value  . SARS-COV-2, NAA - [facility] 07/19/2019 Detected*     ASSESSMENT: Mr. Carneiro is a 69 y.o. male presenting for consultation for cholelithiasis.    Patient was oriented about the diagnosis of cholelithiasis. Also oriented about what is the gallbladder, its anatomy and function and the implications of having stones. The patient was oriented about the treatment alternatives (observation vs cholecystectomy).  Patient was oriented that a low percentage of patient will continue to have similar pain symptoms even after the gallbladder is removed. Surgical technique (open vs laparoscopic) was discussed. It was also discussed the goals of the surgery (decrease the pain episodes and avoid the risk of cholecystitis) and the risk of surgery including: bleeding, infection, common bile duct injury, stone retention, injury to other organs such as bowel, liver, stomach, other complications such as hernia, bowel obstruction among others. Also discussed with patient about anesthesia and its complications such as: reaction to medications, pneumonia, heart complications, death, among others.  It is also important that this patient is currently optimized from his medical condition.  I think that is reasonable to proceed with elective cholecystectomy instead of waiting this patient to have an acute cholecystitis and needed emergent/urgent surgical intervention when he is on the anticoagulation versus needing percutaneous cholecystostomy and then interval cholecystectomy.  If he gets cleared by cardiology and he is able to hold his anticoagulation therapy I consider that this patient will be a reasonable risk for elective robotic assisted laparoscopic cholecystectomy.  He understand that he is a high risk of surgery but agreed to proceed.  Cholelithiasis without cholecystitis [K80.20]  PLAN: 1. Robotic assisted laparoscopic cholecystectomy (33545) 2.  CBC, CMP done 08/16/19 3.  Cardiology clearance for holding anticoagulation for at least 48 hours.  4.  Do not take aspirin 5 days before the procedure 5.  Contact us if has any question or concern.  Patient verbalized understanding, all questions were answered, and were agreeable with the plan outlined above.   I spent a  total of 40 minutes in both face-to-face and non-face-to-face activities for this visit on the date of this encounter.  Herbert Pun,  MD  Electronically signed by Herbert Pun, MD

## 2019-10-03 ENCOUNTER — Ambulatory Visit (INDEPENDENT_AMBULATORY_CARE_PROVIDER_SITE_OTHER): Payer: Self-pay | Admitting: *Deleted

## 2019-10-03 ENCOUNTER — Other Ambulatory Visit: Payer: Self-pay

## 2019-10-03 DIAGNOSIS — L603 Nail dystrophy: Secondary | ICD-10-CM

## 2019-10-03 NOTE — Progress Notes (Signed)
Patient presents today for the 4th laser treatment. Diagnosed with mycotic nail infection by Dr. Milinda Pointer.   Toenail most affected are the hallux nails bilateral. All of his nails are looking much better as well as the skin on the tip of the right hallux.  All other systems are negative.  Nails were filed thin. Laser therapy was administered to 1-5, with exception of the 2nd nails (due to amputations) bilateral and patient tolerated the treatment well. All safety precautions were in place.   I dispensed a silicone toe cap for the right hallux to help protect the tip of that toe.  Follow up in 4 weeks for laser # 5.

## 2019-10-05 ENCOUNTER — Other Ambulatory Visit: Payer: Self-pay

## 2019-10-05 ENCOUNTER — Encounter
Admission: RE | Admit: 2019-10-05 | Discharge: 2019-10-05 | Disposition: A | Discharge status: Home / Self Care | Payer: Medicare Other | Source: Ambulatory Visit | Attending: General Surgery | Admitting: General Surgery

## 2019-10-05 DIAGNOSIS — Z01818 Encounter for other preprocedural examination: Secondary | ICD-10-CM | POA: Diagnosis not present

## 2019-10-05 DIAGNOSIS — I4891 Unspecified atrial fibrillation: Secondary | ICD-10-CM | POA: Diagnosis not present

## 2019-10-05 DIAGNOSIS — I10 Essential (primary) hypertension: Secondary | ICD-10-CM | POA: Diagnosis not present

## 2019-10-05 HISTORY — DX: Fatty (change of) liver, not elsewhere classified: K76.0

## 2019-10-05 NOTE — Pre-Procedure Instructions (Signed)
Progress Notes - documented in this encounter Erby Pian, MD - 09/07/2019 8:30 AM EST Formatting of this note might be different from the original.  Follow-up  History of Present Illness: Billy Freeman is a 69 y.o. male that presents to clinic for recheck. Post covid, s/p prednisone (12/20). Feeling much better. Taste is back, strength is improved. No fever or sob. No chest pain, leg pain, edema or syncope. On eliquis for Afib. No bleeding. No wheezing or coughing. He is wearing his cpap faith fully, + respnse. + gallstones, may need cholecystectomy.   Current Medications:  Current Outpatient Medications  Medication Sig Dispense Refill  . allopurinoL (ZYLOPRIM) 100 MG tablet TAKE 1 TABLET BY MOUTH EVERY DAY 90 tablet 1  . apixaban (ELIQUIS) 5 mg tablet Take 1 tablet (5 mg total) by mouth every 12 (twelve) hours 180 tablet 3  . azithromycin (ZITHROMAX) 250 MG tablet Take 250 mg by mouth once daily  . budesonide-formoteroL (SYMBICORT) 160-4.5 mcg/actuation inhaler Inhale 2 inhalations into the lungs 2 (two) times daily 3 Inhaler 3  . citalopram (CELEXA) 20 MG tablet Take 20 mg by mouth once daily.  . clindamycin (CLEOCIN T) 1 % topical solution Cleocin T 1 % solution APPLY A THIN LAYER TO THE AFFECTED AREA(S) BY TOPICAL ROUTE 2 TIMES PER DAY  . cyanocobalamin (VITAMIN B12) 1000 MCG tablet Take 1,000 mcg by mouth once daily  . indapamide (LOZOL) 2.5 MG tablet TAKE 1 TABLET BY MOUTH EVERY DAY (Patient taking differently: every other day ) 90 tablet 1  . miscellaneous medical supply Misc INCENTIVE SPIROMETER - USE AS DIRECTED 1 each 1  . omega-3 fatty acids/fish oil 340-1,000 mg capsule Take 1 capsule by mouth 2 (two) times daily  . propranoloL (INDERAL) 20 MG tablet TAKE 1 TABLET BY MOUTH TWICE A DAY 180 tablet 1  . tadalafil (CIALIS) 5 MG tablet 2 tabs as needed prior to sexual activity 30 tablet 5  . temazepam (RESTORIL) 15 mg capsule Take 1 capsule (15 mg total) by mouth nightly as  needed for Sleep for up to 30 days 30 capsule 1   No current facility-administered medications for this visit.   Problem List:  Patient Active Problem List  Diagnosis  . HTN (hypertension)  . OSA on CPAP  . Depression  . Occasional tremors  . GERD (gastroesophageal reflux disease)  . COPD (chronic obstructive pulmonary disease) (CMS-HCC)  . Kidney stones  . Rosacea  . Decreased left ventricular function  . Hyperlipidemia  . Calculus of kidney  . Hydronephrosis  . Renal colic  . Calculus of ureter  . Esophageal reflux  . Essential hypertension  . Chronic obstructive pulmonary disease ([place])  . Obstructive sleep apnea  . Heart disease  . Impotence of organic origin  . Closed compression fracture of L1 lumbar vertebra (CMS-HCC)  . Personal history of gout  . History of pulmonary embolism  . Atrial fibrillation (CMS-HCC)  . Traumatic complete tear of right rotator cuff  . Rotator cuff tendinitis, right  . Tendinitis of upper biceps tendon of right shoulder  . Degenerative tear of glenoid labrum of right shoulder  . Senile osteoporosis   History: Past Medical History:  Diagnosis Date  . Atrial fibrillation (CMS-HCC) June 2019  . Basal cell carcinoma  resected on nose, Dr. Katharina Caper in [place]  . Benign essential tremor  . COPD (chronic obstructive pulmonary disease) (CMS-HCC)  . Decreased left ventricular function  mildly  . Depression  . Diabetes mellitus type 2,  uncomplicated (CMS-HCC)  . Diverticulosis  colon polyps on colonoscopy  . Fatty liver 2006  presumed due to alcoholic hepatitis status post liver biopsy  . GERD (gastroesophageal reflux disease)  . Glaucoma (increased eye pressure)  Hx of borderline  . Gout  . Hyperlipidemia  . Hypertension  . Insomnia  . Pulmonary embolism (CMS-HCC) FEB 2017  . Sleep apnea  on CPAP   Past Surgical History:  Procedure Laterality Date  . BRONCHOSCOPY Early 2019  . BUNION CORRECTION Bilateral  S/P  . COLONOSCOPY  10/17/2010  08/11/2007, 06/17/2001; Adenomatous Polyps, FH Colon Polyps (Mother/Sister): CBF 09/2015  . COLONOSCOPY 01/08/2016  Adenomatous Polyps, FH Colon Polyps (Mother): CBF 12/2020  . extensive arthroscopic debridement arthroscopic subacromial decompression mini-open rotator cuff repair supplemente by a smith and nephew regeneten patch and mini-open biceps tenodesis, right shoulder Right 02/01/2019  . KNEE ARTHROSCOPY Bilateral  x2 bilateral, total of 4  . KNEE ARTHROSCOPY Right 10/15/2012  Dr. Marry Guan, meniscus surgery  . LIVER BIOPSY 2006 and 2010   Family History  Problem Relation Age of Onset  . High blood pressure (Hypertension) Mother  . Heart disease Mother  . Cancer Mother  . Emphysema Father  . High blood pressure (Hypertension) Father  . Diabetes type II Father  . Heart disease Father  . COPD Father  . Osteopenia Sister   Social History   Socioeconomic History  . Marital status: Married  Spouse name: Not on file  . Number of children: Not on file  . Years of education: Not on file  . Highest education level: Not on file  Occupational History  . Not on file  Social Needs  . Financial resource strain: Not on file  . Food insecurity  Worry: Not on file  Inability: Not on file  . Transportation needs  Medical: Not on file  Non-medical: Not on file  Tobacco Use  . Smoking status: Never Smoker  . Smokeless tobacco: Never Used  . Tobacco comment: uses edibles  Substance and Sexual Activity  . Alcohol use: Not Currently  Alcohol/week: 0.0 standard drinks  Comment: very little beer  . Drug use: Not Currently  Frequency: 7.0 times per week  Types: Marijuana  Comment: pot only. just edibles. not smoking any longer.  Marland Kitchen Sexual activity: Defer  Partners: Female  Comment: wife  Other Topics Concern  . Not on file  Social History Narrative  . Not on file   Allergies:  Oxycodone  Review of Systems: As per above. Pretty much unchanged with the exception that  his breathing is improved. No associated cardiopulmonary, GI, GU, dermatological symptoms today. No focal neurological symptoms or psychological changes.   Physical Exam: BP 93/67  Pulse 78  Temp 36.5 C (97.7 F) (Oral)  Wt (!) 104.8 kg (231 lb)  SpO2 95%  BMI 28.12 kg/m (!) 104.8 kg (231 lb) 95% General: NAD. Able to speak in complete sentences without cough or dyspnea HEENT: Normocephalic, nontraumatic. Extraocular movements intact NECK: Supple. No JVD, nodes, thyromegaly CV: RRR no murmurs, gallops, rubs PULM: Normal respiratory effort, without wheezing or crackles EXTREMITIES: No significant edema, cyanosis or Homans'signs SKIN: Fair turgor. No rashes LYMPHATIC: No nodes NEURO: No gross deficits PSYCH: Appropriate affect, alert, oriented   Impression:/Plan: Multiple pulmonary nodules, <6 mm.n. Chest ct did not show new nodule or enlarging nodules. ( followed x 2 years.) benign Follow  Moderate copd, not smoking, no respiratory distress, post covid. Strength coming back. Breathing almost back to normal, pulm wise cleared for  cholecystectomy if indicated -continue same regimen ( symbicort, prn albuterol)  Sleep apnea, on cpap, 12 cm h20, wearing it faithfully, resting well, machine is functioning well. -continue cpap on present level  B/P low normal, non symptomatic, on b/p meds -fluids -monitor b/p -f/u with pcp      Electronically signed by Erby Pian, MD at 09/07/2019 9:01 AM EST

## 2019-10-05 NOTE — Pre-Procedure Instructions (Signed)
ECG 12-lead3/04/2019 [facility] Component Name Value Ref Range  Vent Rate (bpm) 49   QRS Interval (msec) 80   QT Interval (msec) 446   QTc (msec) 402   Other Result Information  This result has an attachment that is not available.  Result Narrative  Atrial fibrillation with slow ventricular response Low voltage QRS Cannot rule out Anteroseptal infarct , age undetermined Abnormal ECG When compared with ECG of 25-Aug-2014 11:20, Previous ECG has undetermined rhythm, needs review Questionable change in QRS duration Minimal criteria for Anteroseptal infarct are now present I reviewed and concur with this report. Electronically signed [id number], MD, Cristie Hem [id number]) on 10/15/2018 9:03:55 AM  Status Results Details   Encounter Summary

## 2019-10-05 NOTE — Pre-Procedure Instructions (Signed)
CARDIAC CLEARANCE ON CHART FROM DR PARASCHOS LOW RISK  PULMONARY CLEARANCE IN CARE EVERYWHERE FROM DR Raul Del

## 2019-10-05 NOTE — Patient Instructions (Addendum)
Your procedure is scheduled on: 10-11-19 TUESDAY Report to Same Day Surgery 2nd floor medical mall [facility] Entrance-take elevator on left to 2nd floor.  Check in with surgery information desk.) To find out your arrival time please call [phone] between 1PM - 3PM on 10-10-19 MONDAY  Remember: Instructions that are not followed completely may result in serious medical risk, up to and including death, or upon the discretion of your surgeon and anesthesiologist your surgery may need to be rescheduled.    _x___ 1. Do not eat food after midnight the night before your procedure. NO GUM OR CANDY AFTER MIDNIGHT. You may drink clear liquids up to 2 hours before you are scheduled to arrive at the hospital for your procedure.  Do not drink clear liquids within 2 hours of your scheduled arrival to the hospital.  Clear liquids include  --Water or Apple juice without pulp  --Gatorade  --Black Coffee or Clear Tea (No milk, no creamers, do not add anything to the coffee or Tea    ____Ensure clear carbohydrate drink on the way to the hospital for bariatric patients  ____Ensure clear carbohydrate drink 3 hours before surgery.    __x__ 2. No Alcohol for 24 hours before or after surgery.   __x__3. No Smoking or e-cigarettes for 24 prior to surgery.  Do not use any chewable tobacco products for at least 6 hour prior to surgery   ____  4. Bring all medications with you on the day of surgery if instructed.    __x__ 5. Notify your doctor if there is any change in your medical condition     (cold, fever, infections).    x___6. On the morning of surgery brush your teeth with toothpaste and water.  You may rinse your mouth with mouth wash if you wish.  Do not swallow any toothpaste or mouthwash.   Do not wear jewelry, make-up, hairpins, clips or nail polish.  Do not wear lotions, powders, or perfumes.   Do not shave 48 hours prior to surgery. Men may shave face and neck.  Do not bring valuables to  the hospital.    [facility] is not responsible for any belongings or valuables.               Contacts, dentures or bridgework may not be worn into surgery.  Leave your suitcase in the car. After surgery it may be brought to your room.  For patients admitted to the hospital, discharge time is determined by your treatment team.  _  Patients discharged the day of surgery will not be allowed to drive home.  You will need someone to drive you home and stay with you the night of your procedure.    Please read over the following fact sheets that you were given:   [facility] Preparing for Surgery  _x___ TAKE THE FOLLOWING MEDICATION THE MORNING OF SURGERY WITH A SMALL SIP OF WATER. These include:  1. ALLOPURINOL (ZYLOPRIM)  2. PROPRANOLOL (INDERAL)  3. CELEXA (CITALOPRAM)  4.  5.  6.  ____Fleets enema or Magnesium Citrate as directed.   _x___ Use CHG Soap or sage wipes as directed on instruction sheet   _X___ Use inhalers on the day of surgery and bring to hospital day of [place]  ____ Stop Metformin and Janumet 2 days prior to surgery.    ____ Take 1/2 of usual insulin dose the night before surgery and none on the morning surgery.  _x___ Follow recommendations from Cardiologist, Pulmonologist or PCP regarding stopping Aspirin, Coumadin, Plavix ,Eliquis, Effient, or Pradaxa, and Pletal-INSTRUCTED BY DR Darrol Poke OFFICE TO TAKE LAST DOSE OF ELIQUIS ON Saturday MORNING (10-08-19) AND THEN STOP  X____Stop Anti-inflammatories such as Advil, Aleve, Ibuprofen, Motrin, Naproxen, Naprosyn, Goodies powders or aspirin products NOW-OK to take Tylenol   _x___ Stop supplements until after surgery-STOP [place]

## 2019-10-06 ENCOUNTER — Encounter
Admission: RE | Admit: 2019-10-06 | Discharge: 2019-10-06 | Disposition: A | Discharge status: Home / Self Care | Payer: Medicare Other | Source: Ambulatory Visit | Attending: General Surgery | Admitting: General Surgery

## 2019-10-06 DIAGNOSIS — Z01818 Encounter for other preprocedural examination: Secondary | ICD-10-CM | POA: Diagnosis not present

## 2019-10-06 LAB — POTASSIUM: Potassium: 4.1 mmol/L (ref 3.5–5.1)

## 2019-10-07 ENCOUNTER — Other Ambulatory Visit: Admission: RE | Admit: 2019-10-07 | Payer: Medicare Other | Source: Ambulatory Visit

## 2019-10-11 ENCOUNTER — Other Ambulatory Visit: Payer: Self-pay

## 2019-10-11 ENCOUNTER — Encounter: Payer: Self-pay | Admitting: General Surgery

## 2019-10-11 ENCOUNTER — Ambulatory Visit: Payer: Medicare Other | Admitting: Anesthesiology

## 2019-10-11 ENCOUNTER — Encounter
Admission: RE | Disposition: A | Discharge status: Home / Self Care | Payer: Self-pay | Source: Home / Self Care | Attending: General Surgery

## 2019-10-11 ENCOUNTER — Ambulatory Visit
Admission: RE | Admit: 2019-10-11 | Discharge: 2019-10-11 | Disposition: A | Discharge status: Home / Self Care | Payer: Medicare Other | Attending: General Surgery | Admitting: General Surgery

## 2019-10-11 DIAGNOSIS — I4891 Unspecified atrial fibrillation: Secondary | ICD-10-CM | POA: Insufficient documentation

## 2019-10-11 DIAGNOSIS — E119 Type 2 diabetes mellitus without complications: Secondary | ICD-10-CM | POA: Insufficient documentation

## 2019-10-11 DIAGNOSIS — E785 Hyperlipidemia, unspecified: Secondary | ICD-10-CM | POA: Diagnosis not present

## 2019-10-11 DIAGNOSIS — Z792 Long term (current) use of antibiotics: Secondary | ICD-10-CM | POA: Insufficient documentation

## 2019-10-11 DIAGNOSIS — K8010 Calculus of gallbladder with chronic cholecystitis without obstruction: Secondary | ICD-10-CM | POA: Diagnosis not present

## 2019-10-11 DIAGNOSIS — Z79899 Other long term (current) drug therapy: Secondary | ICD-10-CM | POA: Insufficient documentation

## 2019-10-11 DIAGNOSIS — Z87442 Personal history of urinary calculi: Secondary | ICD-10-CM | POA: Diagnosis not present

## 2019-10-11 DIAGNOSIS — Z885 Allergy status to narcotic agent status: Secondary | ICD-10-CM | POA: Diagnosis not present

## 2019-10-11 DIAGNOSIS — G4700 Insomnia, unspecified: Secondary | ICD-10-CM | POA: Insufficient documentation

## 2019-10-11 DIAGNOSIS — M109 Gout, unspecified: Secondary | ICD-10-CM | POA: Insufficient documentation

## 2019-10-11 DIAGNOSIS — Z86711 Personal history of pulmonary embolism: Secondary | ICD-10-CM | POA: Insufficient documentation

## 2019-10-11 DIAGNOSIS — Z8616 Personal history of COVID-19: Secondary | ICD-10-CM | POA: Diagnosis not present

## 2019-10-11 DIAGNOSIS — K660 Peritoneal adhesions (postprocedural) (postinfection): Secondary | ICD-10-CM | POA: Insufficient documentation

## 2019-10-11 DIAGNOSIS — F329 Major depressive disorder, single episode, unspecified: Secondary | ICD-10-CM | POA: Insufficient documentation

## 2019-10-11 DIAGNOSIS — G4733 Obstructive sleep apnea (adult) (pediatric): Secondary | ICD-10-CM | POA: Diagnosis not present

## 2019-10-11 DIAGNOSIS — Z7901 Long term (current) use of anticoagulants: Secondary | ICD-10-CM | POA: Diagnosis not present

## 2019-10-11 DIAGNOSIS — J449 Chronic obstructive pulmonary disease, unspecified: Secondary | ICD-10-CM | POA: Diagnosis not present

## 2019-10-11 DIAGNOSIS — N529 Male erectile dysfunction, unspecified: Secondary | ICD-10-CM | POA: Insufficient documentation

## 2019-10-11 DIAGNOSIS — K7460 Unspecified cirrhosis of liver: Secondary | ICD-10-CM | POA: Diagnosis not present

## 2019-10-11 DIAGNOSIS — Z87891 Personal history of nicotine dependence: Secondary | ICD-10-CM | POA: Diagnosis not present

## 2019-10-11 DIAGNOSIS — I10 Essential (primary) hypertension: Secondary | ICD-10-CM | POA: Insufficient documentation

## 2019-10-11 DIAGNOSIS — Z7951 Long term (current) use of inhaled steroids: Secondary | ICD-10-CM | POA: Insufficient documentation

## 2019-10-11 DIAGNOSIS — K8020 Calculus of gallbladder without cholecystitis without obstruction: Secondary | ICD-10-CM | POA: Diagnosis present

## 2019-10-11 LAB — URINE DRUG SCREEN, QUALITATIVE (ARMC ONLY)
Amphetamines, Ur Screen: NOT DETECTED
Barbiturates, Ur Screen: NOT DETECTED
Benzodiazepine, Ur Scrn: POSITIVE — AB
Cannabinoid 50 Ng, Ur ~~LOC~~: POSITIVE — AB
Cocaine Metabolite,Ur ~~LOC~~: NOT DETECTED
MDMA (Ecstasy)Ur Screen: NOT DETECTED
Methadone Scn, Ur: NOT DETECTED
Opiate, Ur Screen: NOT DETECTED
Phencyclidine (PCP) Ur S: NOT DETECTED
Tricyclic, Ur Screen: NOT DETECTED

## 2019-10-11 SURGERY — CHOLECYSTECTOMY, ROBOT-ASSISTED, LAPAROSCOPIC
Anesthesia: General | Site: Abdomen

## 2019-10-11 MED ORDER — INDOCYANINE GREEN 25 MG IV SOLR
1.2500 mg | Freq: Once | INTRAVENOUS | Status: AC
  Administered 2019-10-11: 1.25 mg via INTRAVENOUS
  Filled 2019-10-11: qty 10

## 2019-10-11 MED ORDER — TRAMADOL HCL 50 MG PO TABS: 50.0000 mg | ORAL_TABLET | Freq: Four times a day (QID) | ORAL | 0 refills | Status: AC | PRN

## 2019-10-11 MED ORDER — PROPOFOL 10 MG/ML IV BOLUS
INTRAVENOUS | Status: DC | PRN
  Administered 2019-10-11: 150 mg via INTRAVENOUS

## 2019-10-11 MED ORDER — ROCURONIUM BROMIDE 100 MG/10ML IV SOLN
INTRAVENOUS | Status: DC | PRN
  Administered 2019-10-11: 20 mg via INTRAVENOUS
  Administered 2019-10-11: 10 mg via INTRAVENOUS
  Administered 2019-10-11: 50 mg via INTRAVENOUS

## 2019-10-11 MED ORDER — SUGAMMADEX SODIUM 200 MG/2ML IV SOLN
INTRAVENOUS | Status: DC | PRN
  Administered 2019-10-11: 200 mg via INTRAVENOUS

## 2019-10-11 MED ORDER — BUPIVACAINE-EPINEPHRINE 0.25% -1:200000 IJ SOLN
INTRAMUSCULAR | Status: DC | PRN
  Administered 2019-10-11: 10 mL

## 2019-10-11 MED ORDER — EPHEDRINE 5 MG/ML INJ
INTRAVENOUS | Status: AC
  Filled 2019-10-11: qty 10

## 2019-10-11 MED ORDER — FENTANYL CITRATE (PF) 100 MCG/2ML IJ SOLN
INTRAMUSCULAR | Status: AC
  Filled 2019-10-11: qty 2

## 2019-10-11 MED ORDER — CEFAZOLIN SODIUM-DEXTROSE 2-4 GM/100ML-% IV SOLN
INTRAVENOUS | Status: AC
  Filled 2019-10-11: qty 100

## 2019-10-11 MED ORDER — TRAMADOL HCL 50 MG PO TABS
ORAL_TABLET | ORAL | Status: AC
  Filled 2019-10-11: qty 1

## 2019-10-11 MED ORDER — ONDANSETRON HCL 4 MG/2ML IJ SOLN
INTRAMUSCULAR | Status: AC
  Filled 2019-10-11: qty 2

## 2019-10-11 MED ORDER — EPHEDRINE SULFATE 50 MG/ML IJ SOLN
INTRAMUSCULAR | Status: DC | PRN
  Administered 2019-10-11: 7.5 mg via INTRAVENOUS

## 2019-10-11 MED ORDER — DEXAMETHASONE SODIUM PHOSPHATE 10 MG/ML IJ SOLN
INTRAMUSCULAR | Status: DC | PRN
  Administered 2019-10-11: 5 mg via INTRAVENOUS

## 2019-10-11 MED ORDER — ONDANSETRON HCL 4 MG/2ML IJ SOLN
INTRAMUSCULAR | Status: DC | PRN
  Administered 2019-10-11: 4 mg via INTRAVENOUS

## 2019-10-11 MED ORDER — LIDOCAINE HCL (CARDIAC) PF 100 MG/5ML IV SOSY
PREFILLED_SYRINGE | INTRAVENOUS | Status: DC | PRN
  Administered 2019-10-11: 100 mg via INTRAVENOUS

## 2019-10-11 MED ORDER — ROCURONIUM BROMIDE 10 MG/ML (PF) SYRINGE
PREFILLED_SYRINGE | INTRAVENOUS | Status: AC
  Filled 2019-10-11: qty 10

## 2019-10-11 MED ORDER — LIDOCAINE HCL (PF) 2 % IJ SOLN
INTRAMUSCULAR | Status: AC
  Filled 2019-10-11: qty 5

## 2019-10-11 MED ORDER — FAMOTIDINE 20 MG PO TABS
ORAL_TABLET | ORAL | Status: AC
  Administered 2019-10-11: 20 mg via ORAL
  Filled 2019-10-11: qty 1

## 2019-10-11 MED ORDER — LACTATED RINGERS IV SOLN: INTRAVENOUS | Status: DC

## 2019-10-11 MED ORDER — DEXAMETHASONE SODIUM PHOSPHATE 10 MG/ML IJ SOLN
INTRAMUSCULAR | Status: AC
  Filled 2019-10-11: qty 1

## 2019-10-11 MED ORDER — FAMOTIDINE 20 MG PO TABS: 20.0000 mg | ORAL_TABLET | Freq: Once | ORAL | Status: AC

## 2019-10-11 MED ORDER — PHENYLEPHRINE HCL (PRESSORS) 10 MG/ML IV SOLN
INTRAVENOUS | Status: AC
  Filled 2019-10-11: qty 1

## 2019-10-11 MED ORDER — FENTANYL CITRATE (PF) 100 MCG/2ML IJ SOLN
INTRAMUSCULAR | Status: AC
  Administered 2019-10-11: 25 ug via INTRAVENOUS
  Filled 2019-10-11: qty 2

## 2019-10-11 MED ORDER — FENTANYL CITRATE (PF) 100 MCG/2ML IJ SOLN
INTRAMUSCULAR | Status: DC | PRN
  Administered 2019-10-11: 100 ug via INTRAVENOUS
  Administered 2019-10-11: 50 ug via INTRAVENOUS

## 2019-10-11 MED ORDER — TRAMADOL HCL 50 MG PO TABS
50.0000 mg | ORAL_TABLET | Freq: Four times a day (QID) | ORAL | Status: DC | PRN
  Administered 2019-10-11: 50 mg via ORAL

## 2019-10-11 MED ORDER — FENTANYL CITRATE (PF) 100 MCG/2ML IJ SOLN
25.0000 ug | INTRAMUSCULAR | Status: DC | PRN
  Administered 2019-10-11: 25 ug via INTRAVENOUS

## 2019-10-11 MED ORDER — CEFAZOLIN SODIUM-DEXTROSE 2-4 GM/100ML-% IV SOLN
2.0000 g | INTRAVENOUS | Status: AC
  Administered 2019-10-11: 2 g via INTRAVENOUS

## 2019-10-11 MED ORDER — PROPOFOL 10 MG/ML IV BOLUS
INTRAVENOUS | Status: AC
  Filled 2019-10-11: qty 20

## 2019-10-11 SURGICAL SUPPLY — 58 items
BAG INFUSER PRESSURE 100CC (MISCELLANEOUS) IMPLANT
BLADE SURG SZ11 CARB STEEL (BLADE) ×3 IMPLANT
CANISTER SUCT 1200ML W/VALVE (MISCELLANEOUS) ×3 IMPLANT
CANNULA REDUC XI 12-8 STAPL (CANNULA) ×3
CANNULA REDUCER 12-8 DVNC XI (CANNULA) ×2 IMPLANT
CHLORAPREP W/TINT 26 (MISCELLANEOUS) ×3 IMPLANT
CLIP VESOLOCK MED LG 6/CT (CLIP) ×3 IMPLANT
COVER TIP SHEARS 8 DVNC (MISCELLANEOUS) IMPLANT
COVER TIP SHEARS 8MM DA VINCI (MISCELLANEOUS)
COVER WAND RF STERILE (DRAPES) ×3 IMPLANT
DECANTER SPIKE VIAL GLASS SM (MISCELLANEOUS) ×6 IMPLANT
DEFOGGER SCOPE WARMER CLEARIFY (MISCELLANEOUS) ×3 IMPLANT
DERMABOND ADVANCED (GAUZE/BANDAGES/DRESSINGS) ×1
DERMABOND ADVANCED .7 DNX12 (GAUZE/BANDAGES/DRESSINGS) ×2 IMPLANT
DRAPE ARM DVNC X/XI (DISPOSABLE) ×8 IMPLANT
DRAPE COLUMN DVNC XI (DISPOSABLE) ×2 IMPLANT
DRAPE DA VINCI XI ARM (DISPOSABLE) ×12
DRAPE DA VINCI XI COLUMN (DISPOSABLE) ×3
ELECT CAUTERY BLADE 6.4 (BLADE) IMPLANT
ELECT REM PT RETURN 9FT ADLT (ELECTROSURGICAL) ×3
ELECTRODE REM PT RTRN 9FT ADLT (ELECTROSURGICAL) ×2 IMPLANT
GLOVE BIO SURGEON STRL SZ 6.5 (GLOVE) ×6 IMPLANT
GLOVE BIOGEL PI IND STRL 6.5 (GLOVE) ×4 IMPLANT
GLOVE BIOGEL PI INDICATOR 6.5 (GLOVE) ×2
GOWN STRL REUS W/ TWL LRG LVL3 (GOWN DISPOSABLE) ×6 IMPLANT
GOWN STRL REUS W/TWL LRG LVL3 (GOWN DISPOSABLE) ×9
GRASPER SUT TROCAR 14GX15 (MISCELLANEOUS) ×1 IMPLANT
IRRIGATOR SUCT 8 DISP DVNC XI (IRRIGATION / IRRIGATOR) IMPLANT
IRRIGATOR SUCTION 8MM XI DISP (IRRIGATION / IRRIGATOR)
IV NS 1000ML (IV SOLUTION)
IV NS 1000ML BAXH (IV SOLUTION) IMPLANT
KIT PINK PAD W/HEAD ARE REST (MISCELLANEOUS) ×3
KIT PINK PAD W/HEAD ARM REST (MISCELLANEOUS) ×2 IMPLANT
LABEL OR SOLS (LABEL) ×3 IMPLANT
NDL INSUFFLATION 14GA 120MM (NEEDLE) ×2 IMPLANT
NEEDLE HYPO 22GX1.5 SAFETY (NEEDLE) ×3 IMPLANT
NEEDLE INSUFFLATION 14GA 120MM (NEEDLE) ×3 IMPLANT
NS IRRIG 500ML POUR BTL (IV SOLUTION) ×3 IMPLANT
OBTURATOR OPTICAL STANDARD 8MM (TROCAR) ×3
OBTURATOR OPTICAL STND 8 DVNC (TROCAR) ×2
OBTURATOR OPTICALSTD 8 DVNC (TROCAR) ×2 IMPLANT
PACK LAP CHOLECYSTECTOMY (MISCELLANEOUS) ×3 IMPLANT
PENCIL ELECTRO HAND CTR (MISCELLANEOUS) IMPLANT
POUCH SPECIMEN RETRIEVAL 10MM (ENDOMECHANICALS) ×3 IMPLANT
SEAL CANN UNIV 5-8 DVNC XI (MISCELLANEOUS) ×6 IMPLANT
SEAL XI 5MM-8MM UNIVERSAL (MISCELLANEOUS) ×9
SET TUBE SMOKE EVAC HIGH FLOW (TUBING) ×3 IMPLANT
SOLUTION ELECTROLUBE (MISCELLANEOUS) ×3 IMPLANT
SPONGE LAP 4X18 RFD (DISPOSABLE) ×1 IMPLANT
STAPLER CANNULA SEAL DVNC XI (STAPLE) ×2 IMPLANT
STAPLER CANNULA SEAL XI (STAPLE) ×3
SUT MNCRL 4-0 (SUTURE) ×3
SUT MNCRL 4-0 27XMFL (SUTURE) ×2
SUT VIC AB 3-0 SH 27 (SUTURE)
SUT VIC AB 3-0 SH 27X BRD (SUTURE) IMPLANT
SUT VICRYL 0 AB UR-6 (SUTURE) IMPLANT
SUTURE MNCRL 4-0 27XMF (SUTURE) ×2 IMPLANT
TROCAR XCEL NON-BLD 5MMX100MML (ENDOMECHANICALS) IMPLANT

## 2019-10-11 NOTE — Interval H&P Note (Signed)
History and Physical Interval Note:  10/11/2019 6:58 AM  Billy Freeman.  has presented today for surgery, with the diagnosis of K80.20 Cholelithiasis  w/o cholecystitis.  The various methods of treatment have been discussed with the patient and family. After consideration of risks, benefits and other options for treatment, the patient has consented to  Procedure(s): XI ROBOTIC [name] (N/A) as a surgical intervention.  The patient's history has been reviewed, patient examined, no change in status, stable for surgery.  I have reviewed the patient's chart and labs.  Questions were answered to the patient's satisfaction.     Herbert Pun

## 2019-10-11 NOTE — Discharge Instructions (Signed)
AMBULATORY SURGERY  DISCHARGE INSTRUCTIONS   1) The drugs that you were given will stay in your system until tomorrow so for the next 24 hours you should not:  A) Drive an automobile B) Make any legal decisions C) Drink any alcoholic beverage   2) You may resume regular meals tomorrow.  Today it is better to start with liquids and gradually work up to solid foods.  You may eat anything you prefer, but it is better to start with liquids, then soup and crackers, and gradually work up to solid foods.   3) Please notify your doctor immediately if you have any unusual bleeding, trouble breathing, redness and pain at the surgery site, drainage, fever, or pain not relieved by medication.    4) Additional Instructions:        Please contact your physician with any problems or Same Day Surgery at [phone], Monday through Friday 6 am to 4 pm, or [place] at [facility] number at [phone]. Diet: Resume home heart healthy regular diet.   Activity: No heavy lifting >20 pounds (children, pets, laundry, garbage) or strenuous activity until follow-up, but light activity and walking are encouraged. Do not drive or drink alcohol if taking narcotic pain medications.  Wound care: May shower with soapy water and pat dry (do not rub incisions), but no baths or submerging incision underwater until follow-up. (no swimming)   Medications: Resume all home medications. For mild to moderate pain: acetaminophen (Tylenol) or ibuprofen (if no kidney disease). Combining Tylenol with alcohol can substantially increase your risk of causing liver disease. Narcotic pain medications, if prescribed, can be used for severe pain, though may cause nausea, constipation, and drowsiness. Do not combine Tylenol and Norco within a 6 hour period as Norco contains Tylenol. If you do not need the narcotic pain medication, you do not need to fill the prescription.  Restart Eliquis Tomorrow.   Call office  [phone]) at any time if any questions, worsening pain, fevers/chills, bleeding, drainage from incision site, or other concerns.

## 2019-10-11 NOTE — Op Note (Addendum)
Preoperative diagnosis: Cholelithiasis  Postoperative diagnosis: Cholelithiasis  Procedure: Robotic Assisted Laparoscopic Cholecystectomy.   Anesthesia: GETA   Surgeon: Dr. Windell Moment  Wound Classification: Clean Contaminated  Indications: Patient is a 69 y.o. male developed right upper quadrant pain and on workup was found to have cholelithiasis with a normal common duct. Laparoscopic cholecystectomy was elected.  Findings: Abundant adhesions to omentum was identified.  Critical view of safety achieved Cystic duct and artery identified, ligated and divided Adequate hemostasis Cirrhotic liver  Description of procedure: The patient was placed on the operating table in the supine position. General anesthesia was induced. A time-out was completed verifying correct patient, procedure, site, positioning, and implant(s) and/or special equipment prior to beginning this procedure. An orogastric tube was placed. The abdomen was prepped and draped in the usual sterile fashion.  An incision was made in a natural skin line below the umbilicus.  The fascia was elevated and the Veress needle inserted. Proper position was confirmed by aspiration and saline meniscus test.  The abdomen was insufflated with carbon dioxide to a pressure of 15 mmHg. The patient tolerated insufflation well. A 8-mm trocar was then inserted in optiview fashion.  The laparoscope was inserted and the abdomen inspected. No injuries from initial trocar placement were noted. Additional trocars were then inserted in the following locations: an 8-mm trocar in the left lateral abdomen, and another two 8-mm trocars to the right side of the abdomen 5 cm appart. The umbilical trocar was changed to a 12 mm trocar all under direct visualization. The abdomen was inspected and no abnormalities were found. The table was placed in the reverse Trendelenburg position with the right side up. The robotic arms were docked and target anatomy  identified. Instrument inserted under direct visualization.  Filmy adhesions between the gallbladder and omentum, duodenum and transverse colon were lysed with electrocautery. The dome of the gallbladder was grasped with a prograsp and retracted over the dome of the liver. The infundibulum was also grasped with an atraumatic grasper and retracted toward the right lower quadrant. This maneuver exposed Calot's triangle. The peritoneum overlying the gallbladder infundibulum was then incised and the cystic duct and cystic artery identified and circumferentially dissected. Critical view of safety reviewed before ligating any structure. FireFly help identifying biliary structures. The cystic duct and cystic artery were then doubly clipped and divided close to the gallbladder.  The gallbladder was then dissected from its peritoneal attachments by electrocautery. Hemostasis was checked and the gallbladder and contained stones were removed using an endoscopic retrieval bag. The gallbladder was passed off the table as a specimen. The gallbladder fossa was copiously irrigated with saline and hemostasis was obtained. There was no evidence of bleeding from the gallbladder fossa or cystic artery or leakage of the bile from the cystic duct stump. Secondary trocars were removed under direct vision. No bleeding was noted. The robotic arms were undoked. The scope was withdrawn and the umbilical trocar removed. The abdomen was allowed to collapse. The fascia of the 30mm trocar sites was closed with figure-of-eight 0 vicryl sutures. The skin was closed with subcuticular sutures of 4-0 monocryl and topical skin adhesive. The orogastric tube was removed.  The patient tolerated the procedure well and was taken to the postanesthesia care unit in stable condition.   Specimen: Gallbladder  Complications: None  EBL: 10 mL

## 2019-10-11 NOTE — Anesthesia Procedure Notes (Signed)
Procedure Name: Intubation Date/Time: 10/11/2019 7:40 AM Performed by: Chanetta Marshall, CRNA Pre-anesthesia Checklist: Patient identified, Emergency Drugs available, Suction available and Patient being monitored Patient Re-evaluated:Patient Re-evaluated prior to induction Oxygen Delivery Method: Circle system utilized Preoxygenation: Pre-oxygenation with 100% oxygen Induction Type: IV induction Ventilation: Mask ventilation without difficulty Laryngoscope Size: McGraph and 4 Grade View: Grade I Tube type: Oral Tube size: 8.0 mm Number of attempts: 1 Airway Equipment and Method: Stylet and Video-laryngoscopy Placement Confirmation: ETT inserted through vocal cords under direct vision,  positive ETCO2,  breath sounds checked- equal and bilateral and CO2 detector Secured at: 23 cm Tube secured with: Tape Dental Injury: Teeth and Oropharynx as per pre-operative assessment

## 2019-10-11 NOTE — Transfer of Care (Signed)
Immediate Anesthesia Transfer of Care Note  Patient: Billy Freeman.  Procedure(s) Performed: XI ROBOTIC ASSISTED LAPAROSCOPIC CHOLECYSTECTOMY (N/A Abdomen) INDOCYANINE GREEN FLUORESCENCE IMAGING (ICG)  Patient Location: PACU  Anesthesia Type:General  Level of Consciousness: awake, alert  and oriented  Airway & Oxygen Therapy: Patient Spontanous Breathing and Patient connected to nasal cannula oxygen  Post-op Assessment: Report given to RN and Post -op Vital signs reviewed and stable  Post vital signs: Reviewed and stable  Last Vitals:  Vitals Value Taken Time  BP    Temp    Pulse    Resp    SpO2      Last Pain:  Vitals:   10/11/19 0628  TempSrc: Temporal  PainSc: 0-No pain         Complications: No apparent anesthesia complications

## 2019-10-11 NOTE — Anesthesia Preprocedure Evaluation (Signed)
Anesthesia Evaluation  Patient identified by MRN, date of birth, ID band Patient awake    Reviewed: Allergy & Precautions, H&P , NPO status , Patient's Chart, lab work & pertinent test results  History of Anesthesia Complications (+) history of anesthetic complications (3 weeks of back pain after epidural)  Airway Mallampati: III  TM Distance: <3 FB Neck ROM: limited    Dental  (+) Poor Dentition, Chipped   Pulmonary shortness of breath and with exertion, sleep apnea , pneumonia, COPD, former smoker,           Cardiovascular Exercise Tolerance: Good hypertension, (-) angina(-) DOE + dysrhythmias Atrial Fibrillation      Neuro/Psych PSYCHIATRIC DISORDERS negative neurological ROS     GI/Hepatic negative GI ROS, GERD  ,(+)     substance abuse  marijuana use, Hepatitis -  Endo/Other  negative endocrine ROS  Renal/GU Renal disease     Musculoskeletal   Abdominal   Peds  Hematology negative hematology ROS (+)   Anesthesia Other Findings Past Medical History: No date: Cancer ([place])     Comment:  SKIN No date: Cirrhosis (HCC)     Comment:  POSSIBLE No date: COPD (chronic obstructive pulmonary disease) (HCC) No date: Depression No date: Dysrhythmia     Comment:  a-fib No date: Edema     Comment:  right FEET/LEG No date: Fatty liver No date: GERD (gastroesophageal reflux disease)     Comment:  h/o No date: Heart murmur No date: History of kidney stones No date: HOH (hard of hearing) No date: Hypertension No date: Pneumonia     Comment:  January/February 2020 No date: Pulmonary embolism (HCC) No date: Pulmonary nodules No date: Reflux No date: Rosacea No date: Sleep apnea     Comment:  cpap No date: Tremors of nervous system No date: Wears hearing aid  Past Surgical History: No date: ANTERIOR CRUCIATE LIGAMENT (ACL) REVISION No date: broken nose surgery No date: BUNIONECTOMY; Bilateral 02/09/2018:  CATARACT EXTRACTION W/PHACO; Right     Comment:  Procedure: CATARACT EXTRACTION PHACO AND INTRAOCULAR               LENS PLACEMENT (IOC);  Surgeon: Birder Robson, MD;                Location: [facility] ORS;  Service: Ophthalmology;  Laterality:              Right;  [place] 00:45 AP% 14.4 CDE 6.58 Fluid pack lot #               [id number] 12/14/2018: CATARACT EXTRACTION W/PHACO; Left     Comment:  Procedure: CATARACT EXTRACTION PHACO AND INTRAOCULAR               LENS PLACEMENT (IOC LEFT;  Surgeon: Birder Robson,               MD;  Location: [place];  Service:               Ophthalmology;  Laterality: Left; 04/30/2017: FLEXIBLE BRONCHOSCOPY; N/A     Comment:  Procedure: FLEXIBLE BRONCHOSCOPY;  Surgeon:               Laverle Hobby, MD;  Location: [facility] ORS;  Service:              Pulmonary;  Laterality: N/A; No date: KIDNEY STONE SURGERY No date: MENISCUS REPAIR; Bilateral     Comment:  right knee x 3, left knee x 2 02/01/2019: SHOULDER ARTHROSCOPY  WITH ROTATOR CUFF REPAIR AND  SUBACROMIAL DECOMPRESSION; Right     Comment:  Procedure: Extensive arthroscopic debridement,               arthroscopic subacromial decompression, mini-open rotator              cuff repair supplemented by a [employer] Regeneten               patch, and mini-open biceps tenodesis, right shoulder.;                Surgeon: Corky Mull, MD;  Location: [facility] ORS;                Service: Orthopedics;  Laterality: Right; No date: TOE AMPUTATION; Bilateral     Comment:  index toes  BMI    Body Mass Index: 27.99 kg/m      Reproductive/Obstetrics negative OB ROS                             Anesthesia Physical Anesthesia Plan  ASA: III  Anesthesia Plan: General ETT   Post-op Pain Management:    Induction: Intravenous  PONV Risk Score and Plan: Ondansetron, Dexamethasone, Midazolam and Treatment may vary due to age or medical condition  Airway Management Planned:  Oral ETT  Additional Equipment:   Intra-op Plan:   Post-operative Plan: Extubation in OR  Informed Consent: I have reviewed the patients History and Physical, chart, labs and discussed the procedure including the risks, benefits and alternatives for the proposed anesthesia with the patient or authorized representative who has indicated his/her understanding and acceptance.     Dental Advisory Given  Plan Discussed with: Anesthesiologist, CRNA and Surgeon  Anesthesia Plan Comments: (Patient has cardiac clearance for this procedure.   Patient consented for risks of anesthesia including but not limited to:  - adverse reactions to medications - damage to teeth, lips or other oral mucosa - sore throat or hoarseness - Damage to heart, brain, lungs or loss of life  Patient voiced understanding.)        Anesthesia Quick Evaluation

## 2019-10-12 LAB — SURGICAL PATHOLOGY

## 2019-10-13 NOTE — Anesthesia Postprocedure Evaluation (Signed)
Anesthesia Post Note  Patient: Billy Freeman.  Procedure(s) Performed: XI ROBOTIC ASSISTED LAPAROSCOPIC CHOLECYSTECTOMY (N/A Abdomen) INDOCYANINE GREEN FLUORESCENCE IMAGING (ICG)  Patient location during evaluation: PACU Anesthesia Type: General Level of consciousness: awake and alert and oriented Pain management: pain level controlled Vital Signs Assessment: post-procedure vital signs reviewed and stable Respiratory status: spontaneous breathing Cardiovascular status: blood pressure returned to baseline Anesthetic complications: no     Last Vitals:  Vitals:   10/11/19 1127 10/11/19 1200  BP: 117/69 102/64  Pulse: (!) 57 60  Resp: 18 20  Temp: 36.5 C 36.6 C  SpO2: 95% 94%    Last Pain:  Vitals:   10/11/19 1200  TempSrc: Temporal  PainSc: 2                  [name]

## 2019-11-04 ENCOUNTER — Ambulatory Visit (INDEPENDENT_AMBULATORY_CARE_PROVIDER_SITE_OTHER): Payer: Medicare Other | Admitting: *Deleted

## 2019-11-04 ENCOUNTER — Other Ambulatory Visit: Payer: Self-pay

## 2019-11-04 DIAGNOSIS — L603 Nail dystrophy: Secondary | ICD-10-CM

## 2019-11-04 DIAGNOSIS — B351 Tinea unguium: Secondary | ICD-10-CM

## 2019-11-04 NOTE — Progress Notes (Signed)
Patient presents today for the 5th laser treatment. Diagnosed with mycotic nail infection by Dr. Milinda Pointer.   Toenail most affected are the hallux nails bilateral. The nails are looking much better.  All other systems are negative.  Nails were filed thin. Laser therapy was administered to 1-5, with exception of the 2nd nails (due to amputations) bilateral and patient tolerated the treatment well. All safety precautions were in place.    Follow up in 4 weeks for laser # 6.  ~Take pic next visit~

## 2019-12-16 ENCOUNTER — Ambulatory Visit (INDEPENDENT_AMBULATORY_CARE_PROVIDER_SITE_OTHER): Payer: Medicare Other | Admitting: *Deleted

## 2019-12-16 ENCOUNTER — Other Ambulatory Visit: Payer: Self-pay

## 2019-12-16 DIAGNOSIS — L603 Nail dystrophy: Secondary | ICD-10-CM

## 2019-12-16 NOTE — Progress Notes (Signed)
Patient presents today for the 6th laser treatment. Diagnosed with mycotic nail infection by Dr. Milinda Pointer.   Toenail most affected are the hallux nails bilateral. He is very happy with the results. He says they don't really seem to be growing but the yellowish discoloration is gone.  All other systems are negative.  Nails were filed thin. Laser therapy was administered to 1-5, with exception of the 2nd nails (due to amputations) bilateral and patient tolerated the treatment well. All safety precautions were in place.   Patient has completed the recommended laser treatments. He will follow up with Dr. Milinda Pointer in 3 months to evaluate progress.    ~Final pic of nails taken today~

## 2019-12-21 DIAGNOSIS — Z9049 Acquired absence of other specified parts of digestive tract: Secondary | ICD-10-CM | POA: Insufficient documentation

## 2020-03-14 ENCOUNTER — Other Ambulatory Visit: Payer: Self-pay

## 2020-03-14 ENCOUNTER — Ambulatory Visit (INDEPENDENT_AMBULATORY_CARE_PROVIDER_SITE_OTHER): Payer: Medicare Other | Admitting: Podiatry

## 2020-03-14 ENCOUNTER — Encounter: Payer: Self-pay | Admitting: Podiatry

## 2020-03-14 DIAGNOSIS — M79676 Pain in unspecified toe(s): Secondary | ICD-10-CM

## 2020-03-14 DIAGNOSIS — B351 Tinea unguium: Secondary | ICD-10-CM

## 2020-03-15 NOTE — Progress Notes (Signed)
He presents today for follow-up of his laser treatment with Caryl Pina he states that I think they have improved considerably he is very happy with the outcome.  Objective: Vital signs are stable alert oriented x3 toenails are long and thick. Hallux nails demonstrate an improvement of his onychomycosis. Still has some nail dystrophy that is present.  Assessment: Nail dystrophy present.  Plan: Debridement of toenails bilaterally.

## 2020-05-03 DIAGNOSIS — I700 Atherosclerosis of aorta: Secondary | ICD-10-CM | POA: Insufficient documentation

## 2020-06-07 ENCOUNTER — Emergency Department
Admission: EM | Admit: 2020-06-07 | Discharge: 2020-06-08 | Disposition: A | Discharge status: Home / Self Care | Payer: Medicare Other | Attending: Emergency Medicine | Admitting: Emergency Medicine

## 2020-06-07 ENCOUNTER — Other Ambulatory Visit: Payer: Self-pay

## 2020-06-07 DIAGNOSIS — J449 Chronic obstructive pulmonary disease, unspecified: Secondary | ICD-10-CM | POA: Diagnosis not present

## 2020-06-07 DIAGNOSIS — Z886 Allergy status to analgesic agent status: Secondary | ICD-10-CM | POA: Insufficient documentation

## 2020-06-07 DIAGNOSIS — S39012A Strain of muscle, fascia and tendon of lower back, initial encounter: Secondary | ICD-10-CM | POA: Insufficient documentation

## 2020-06-07 DIAGNOSIS — Z79899 Other long term (current) drug therapy: Secondary | ICD-10-CM | POA: Diagnosis not present

## 2020-06-07 DIAGNOSIS — I10 Essential (primary) hypertension: Secondary | ICD-10-CM | POA: Insufficient documentation

## 2020-06-07 DIAGNOSIS — N23 Unspecified renal colic: Secondary | ICD-10-CM | POA: Insufficient documentation

## 2020-06-07 DIAGNOSIS — C439 Malignant melanoma of skin, unspecified: Secondary | ICD-10-CM | POA: Diagnosis not present

## 2020-06-07 DIAGNOSIS — X58XXXA Exposure to other specified factors, initial encounter: Secondary | ICD-10-CM | POA: Insufficient documentation

## 2020-06-07 DIAGNOSIS — R109 Unspecified abdominal pain: Secondary | ICD-10-CM | POA: Diagnosis present

## 2020-06-07 DIAGNOSIS — N200 Calculus of kidney: Secondary | ICD-10-CM | POA: Insufficient documentation

## 2020-06-07 LAB — BASIC METABOLIC PANEL
Anion gap: 11 (ref 5–15)
BUN: 22 mg/dL (ref 8–23)
CO2: 25 mmol/L (ref 22–32)
Calcium: 9.6 mg/dL (ref 8.9–10.3)
Chloride: 101 mmol/L (ref 98–111)
Creatinine, Ser: 1.17 mg/dL (ref 0.61–1.24)
GFR, Estimated: >60 mL/min (ref >60)
Glucose, Bld: 71 mg/dL (ref 70–99)
Potassium: 3.7 mmol/L (ref 3.5–5.1)
Sodium: 137 mmol/L (ref 135–145)

## 2020-06-07 LAB — URINALYSIS, ROUTINE W REFLEX MICROSCOPIC
Bilirubin Urine: NEGATIVE
Glucose, UA: NEGATIVE mg/dL
Hgb urine dipstick: NEGATIVE
Ketones, ur: NEGATIVE mg/dL
Leukocytes,Ua: NEGATIVE
Nitrite: NEGATIVE
Protein, ur: NEGATIVE mg/dL
Specific Gravity, Urine: 1.021 (ref 1.005–1.030)
pH: 5 (ref 5.0–8.0)

## 2020-06-07 LAB — CBC
HCT: 50.3 % (ref 39.0–52.0)
Hemoglobin: 17.5 g/dL — ABNORMAL HIGH (ref 13.0–17.0)
MCH: 34.4 pg — ABNORMAL HIGH (ref 26.0–34.0)
MCHC: 34.8 g/dL (ref 30.0–36.0)
MCV: 99 fL (ref 80.0–100.0)
Platelets: 188 10*3/uL (ref 150–400)
RBC: 5.08 MIL/uL (ref 4.22–5.81)
RDW: 13.8 % (ref 11.5–15.5)
WBC: 6.3 10*3/uL (ref 4.0–10.5)
nRBC: 0 % (ref 0.0–0.2)

## 2020-06-07 MED ORDER — DIAZEPAM 5 MG PO TABS: 5.0000 mg | ORAL_TABLET | Freq: Two times a day (BID) | ORAL | 0 refills | Status: DC | PRN

## 2020-06-07 MED ORDER — DIAZEPAM 5 MG PO TABS
5.0000 mg | ORAL_TABLET | Freq: Once | ORAL | Status: AC
  Administered 2020-06-07: 5 mg via ORAL
  Filled 2020-06-07: qty 1

## 2020-06-07 MED ORDER — IBUPROFEN 600 MG PO TABS
600.0000 mg | ORAL_TABLET | Freq: Once | ORAL | Status: AC
  Administered 2020-06-07: 600 mg via ORAL
  Filled 2020-06-07: qty 1

## 2020-06-07 NOTE — ED Provider Notes (Addendum)
[facility] Emergency Department Provider Note  ____________________________________________  Time seen: Approximately 11:53 PM  I have reviewed the triage vital signs and the nursing notes.   HISTORY  Chief Complaint Flank Pain    HPI Billy Brummitt. is a 69 y.o. male with a history of COPD, GERD, fatty liver, hypertension who comes ED complaining of left flank pain for the past 2 to 3 days.  This is gradual onset, initially being intermittent and mild, and slowly worsening over the last few days.  Worse with movement including sitting up leaning forward or turning his back.  Nonradiating.  No dysuria or hematuria.  No fevers chills chest pain or shortness of breath.  No lower extremity weakness or paresthesia.  Eating and drinking normally.  Pain onset was after patient had been more active doing some yard work recently.      Past Medical History:  Diagnosis Date  . Cancer ([place])    SKIN  . Cirrhosis ([place])    POSSIBLE  . COPD (chronic obstructive pulmonary disease) ([place])   . Depression   . Dysrhythmia    a-fib  . Edema    right FEET/LEG  . Fatty liver   . GERD (gastroesophageal reflux disease)    h/o  . Heart murmur   . History of kidney stones   . HOH (hard of hearing)   . Hypertension   . Pneumonia    January/February 2020  . Pulmonary embolism ([place])   . Pulmonary nodules   . Reflux   . Rosacea   . Sleep apnea    cpap  . Tremors of nervous system   . Wears hearing aid      Patient Active Problem List   Diagnosis Date Noted  . Actinic keratosis 01/03/2019  . Decreased left ventricular function 01/03/2019  . Senile hyperkeratosis 01/03/2019  . Atrial fibrillation ([place]) 10/05/2018  . Bilateral lower extremity edema 01/19/2018  . Blue toes 01/19/2018  . History of pulmonary embolism 07/14/2017  . Personal history of gout 07/14/2017  . Closed compression fracture of L1 vertebra ([place]) 07/15/2016  . 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  History of urinary stone 02/26/2015  . Erectile dysfunction 06/07/2014  . Hyperlipidemia 06/06/2014  . CAFL (chronic airflow limitation) ([place]) 01/02/2014  . Clinical depression 01/02/2014  . Acid reflux 01/02/2014  . Gout 01/02/2014  . BP (high blood pressure) 01/02/2014  . Intermittent tremor 01/02/2014  . Obstructive apnea 01/02/2014  . Acne erythematosa 01/02/2014  . Abnormal involuntary movement 01/02/2014  . Major depressive disorder, single episode 01/02/2014  . Calculus of kidney 11/23/2013  . Hydronephrosis 11/23/2013  . Renal colic 10/93/2355  . Calculi, ureter 11/23/2013     Past Surgical History:  Procedure Laterality Date  . ANTERIOR CRUCIATE LIGAMENT (ACL) REVISION    . broken nose surgery    . BUNIONECTOMY Bilateral   . CATARACT EXTRACTION W/PHACO Right 02/09/2018   Procedure: CATARACT EXTRACTION PHACO AND INTRAOCULAR LENS PLACEMENT (IOC);  Surgeon: Birder Robson, MD;  Location: [facility] ORS;  Service: Ophthalmology;  Laterality: Right;  [place] 00:45 AP% 14.4 CDE 6.58 Fluid pack lot # [id number]  . CATARACT EXTRACTION W/PHACO Left 12/14/2018   Procedure: CATARACT EXTRACTION PHACO AND INTRAOCULAR LENS PLACEMENT (IOC LEFT;  Surgeon: Birder Robson, MD;  Location: [facility];  Service: Ophthalmology;  Laterality: Left;  . FLEXIBLE BRONCHOSCOPY N/A 04/30/2017   Procedure: FLEXIBLE BRONCHOSCOPY;  Surgeon: Laverle Hobby, MD;  Location: [facility] ORS;  Service: Pulmonary;  Laterality: N/A;  . KIDNEY  STONE SURGERY    . MENISCUS REPAIR Bilateral    right knee x 3, left knee x 2  . SHOULDER ARTHROSCOPY WITH ROTATOR CUFF REPAIR AND SUBACROMIAL DECOMPRESSION Right 02/01/2019   Procedure: Extensive arthroscopic debridement, arthroscopic subacromial decompression, mini-open rotator cuff repair supplemented by a Tamala Julian [employer] Nephew Regeneten patch, and mini-open biceps tenodesis, right shoulder.;  Surgeon: Corky Mull, MD;  Location: [facility] ORS;  Service: Orthopedics;  Laterality:  Right;  . TOE AMPUTATION Bilateral    index toes     Prior to Admission medications   Medication Sig Start Date End Date Taking? Authorizing Provider  allopurinol (ZYLOPRIM) 100 MG tablet Take 100 mg by mouth every morning.  12/15/17 10/05/19  [provider]  apixaban (ELIQUIS) 5 MG TABS tablet Take 1 tablet (5 mg total) by mouth 2 (two) times daily. Take 10 mg (two pills) twice daily for seven days, then take 88m (one pill) twice daily. Patient taking differently: Take 5 mg by mouth 2 (two) times daily.  09/19/16   RMerlyn Lot MD  azithromycin (ZITHROMAX) 250 MG tablet Take 250 mg by mouth every evening. 07/24/19   [provider]  budesonide-formoterol (SYMBICORT) 160-4.5 MCG/ACT inhaler Inhale 2 puffs into the lungs 2 (two) times daily.    [provider]  Cholecalciferol (VITAMIN D) 50 MCG (2000 UT) tablet Take 4,000 Units by mouth daily.    [provider]  citalopram (CELEXA) 20 MG tablet Take 20 mg by mouth every morning.  03/28/14   [provider]  clindamycin (CLEOCIN-T) 1 % external solution Apply 1 application topically 2 (two) times daily as needed (for breakout).     [provider]  diazepam (VALIUM) 5 MG tablet Take 1 tablet (5 mg total) by mouth every 12 (twelve) hours as needed for muscle spasms. 06/07/20   SCarrie Mew MD  indapamide (LOZOL) 2.5 MG tablet Take 2.5 mg by mouth every other day.     [provider]  Omega-3 Fatty Acids (FISH OIL) 1200 MG CAPS Take 1,200 mg by mouth 2 (two) times daily.     [provider]  propranolol (INDERAL) 20 MG tablet Take 20 mg by mouth 2 (two) times daily.  04/16/14   [provider]  tadalafil (CIALIS) 5 MG tablet Take 10 mg by mouth daily as needed for erectile dysfunction.  07/14/17   [provider]  temazepam (RESTORIL) 15 MG capsule Take 15 mg by mouth at bedtime.  03/19/14   [provider]  traMADol (ULTRAM) 50 MG tablet Take  1 tablet (50 mg total) by mouth every 6 (six) hours as needed. 10/11/19 10/10/20  CHerbert Pun MD  vitamin B-12 (CYANOCOBALAMIN) 500 MCG tablet Take 500 mcg by mouth daily.    [provider]     Allergies Oxycodone   No family history on file.  Social History Social History   Tobacco Use  . Smoking status: Never Smoker  . Smokeless tobacco: Never Used  Vaping Use  . Vaping Use: Never used  Substance Use Topics  . Alcohol use: Yes    Comment: very rare  . Drug use: Yes    Types: Marijuana    Comment: EDIBLES    Review of Systems  Constitutional:   No fever or chills.  ENT:   No sore throat. No rhinorrhea. Cardiovascular:   No chest pain or syncope. Respiratory:   No dyspnea or cough. Gastrointestinal:   Negative for abdominal pain, vomiting and diarrhea.  Musculoskeletal:  Positive left flank pain as above All other systems reviewed and are negative except as documented above in ROS and HPI.  ____________________________________________   PHYSICAL EXAM:  VITAL SIGNS: ED Triage Vitals  Enc Vitals Group     BP 06/07/20 2204 (!) 102/59     Pulse Rate 06/07/20 2204 71     Resp 06/07/20 2204 18     Temp 06/07/20 2204 97.8 F (36.6 C)     Temp Source 06/07/20 2204 Oral     SpO2 06/07/20 2204 95 %     Weight 06/07/20 2205 225 lb (102.1 kg)     Height 06/07/20 2205 _0  (1.93 m)     Head Circumference --      Peak Flow --      Pain Score 06/07/20 2205 3     Pain Loc --      Pain Edu? --      Excl. in [place]? --     Vital signs reviewed, nursing assessments reviewed.   Constitutional:   Alert and oriented. Non-toxic appearance. Eyes:   Conjunctivae are normal. EOMI. ENT      Head:   Normocephalic and atraumatic.     Cardiovascular:   RRR. Respiratory: Unlabored breathing Gastrointestinal:   Soft and nontender to deep palpation.  No epigastric mass. Non distended.   No rebound, rigidity, or guarding.  No inguinal hernia Musculoskeletal:    There is exquisite tenderness with superficial palpation of the left paraspinous musculature just below the lower costal margins posteriorly, reproducing his pain. Neurologic:   Normal speech and language.  Motor grossly intact. No acute focal neurologic deficits are appreciated.  Skin:    Skin is warm, dry and intact. No rash noted.  No wounds.  ____________________________________________    LABS (pertinent positives/negatives) (all labs ordered are listed, but only abnormal results are displayed) Labs Reviewed  CBC - Abnormal; Notable for the following components:      Result Value   Hemoglobin 17.5 (*)    MCH 34.4 (*)    All other components within normal limits  URINALYSIS, ROUTINE W REFLEX MICROSCOPIC - Abnormal; Notable for the following components:   Color, Urine YELLOW (*)    APPearance CLEAR (*)    All other components within normal limits  BASIC METABOLIC PANEL   ____________________________________________   EKG  ____________________________________________    RADIOLOGY  No results found.  ____________________________________________   PROCEDURES Procedures  ____________________________________________  CLINICAL IMPRESSION / ASSESSMENT AND PLAN / ED COURSE  Pertinent labs & imaging results that were available during my care of the patient were reviewed by me and considered in my medical decision making (see chart for details).  Billy Freeman. was evaluated in Emergency Department on 06/07/2020 for the symptoms described in the history of present illness. He was evaluated in the context of the global COVID-19 pandemic, which necessitated consideration that the patient might be at risk for infection with the SARS-CoV-2 virus that causes COVID-19. Institutional protocols and algorithms that pertain to the evaluation of patients at risk for COVID-19 are in a state of rapid change based on information released by regulatory bodies including the CDC and federal  and state organizations. These policies and algorithms were followed during the patient's care in the ED.   Patient presents with left flank pain, with history and physicals exam.  Consistent with musculoskeletal pain, likely muscle strain.  Lab panel is normal, no hematuria. Will treat with ibuprofen, heat therapy, Valium as needed for sleep.  Continue primary care follow-up.    Considering the patient's symptoms, medical history, and physical examination today, I have low suspicion for cholecystitis or biliary pathology, pancreatitis, perforation or bowel obstruction, hernia, intra-abdominal abscess, AAA or dissection, volvulus, mesenteric ischemia, or appendicitis.    Clinical Course as of Jun 08 745  Fri Jun 08, 2020  0009 Nurse reports BP 86/52 on DC vitals. No orthostatic sx. Pt reports bp is normally about 100/60.  I think this reading is erroneous, due to cuff size or pt's long sleeve shirt.  Presentation is very consistent with MSK pain, and I highly doubt dehydration, vascular injury, or any kind of shock state. He has no cardiopulmonary symptoms.    [PS]    Clinical Course User Index [PS] Carrie Mew, MD     ____________________________________________   FINAL CLINICAL IMPRESSION(S) / ED DIAGNOSES    Final diagnoses:  Strain of lumbar paraspinal muscle, initial encounter     ED Discharge Orders         Ordered    diazepam (VALIUM) 5 MG tablet  Every 12 hours PRN        06/07/20 2351          Portions of this note were generated with dragon dictation software. Dictation errors may occur despite best attempts at proofreading.   Carrie Mew, MD 06/08/20 0000    Carrie Mew, MD 06/08/20 [phone]

## 2020-06-07 NOTE — ED Triage Notes (Signed)
Pt arrives POV w cc of L flank pain x2 days intermittently. Denies n/v/d. No fevers. Pt reports hx kidney stones. States pain is usually in LLQ abd but this time is L flank 3/10, 9/10 when moving. Denies urinary symptoms. Pt states "I think I've got kidney stones"

## 2020-06-08 NOTE — ED Notes (Signed)
E signature not available discharge printed out

## 2020-09-07 ENCOUNTER — Ambulatory Visit
Admission: RE | Admit: 2020-09-07 | Discharge: 2020-09-07 | Disposition: A | Discharge status: Home / Self Care | Payer: Medicare Other | Source: Ambulatory Visit | Attending: Physician Assistant | Admitting: Physician Assistant

## 2020-09-07 ENCOUNTER — Other Ambulatory Visit: Payer: Self-pay | Admitting: Physician Assistant

## 2020-09-07 ENCOUNTER — Other Ambulatory Visit: Payer: Self-pay

## 2020-09-07 DIAGNOSIS — W19XXXA Unspecified fall, initial encounter: Secondary | ICD-10-CM | POA: Insufficient documentation

## 2020-09-07 DIAGNOSIS — S0990XA Unspecified injury of head, initial encounter: Secondary | ICD-10-CM

## 2020-09-07 DIAGNOSIS — I6782 Cerebral ischemia: Secondary | ICD-10-CM | POA: Diagnosis not present

## 2020-09-07 DIAGNOSIS — Y92009 Unspecified place in unspecified non-institutional (private) residence as the place of occurrence of the external cause: Secondary | ICD-10-CM

## 2020-12-28 NOTE — Progress Notes (Signed)
[facility] [place], [place] [address]  Pulmonary Sleep Medicine   Office Visit Note  Patient Name: Billy Freeman. DOB: [birth year] MRN [id number]    Chief Complaint: Obstructive Sleep Apnea visit  Brief History:  Billy Freeman is seen today for initial consult on BIPAP ASV therapy. The patient has a 16 year history of sleep apnea. Patient is using BIPAP ASV nightly.  The patient feels well rested after sleeping with BIPAP. The patient reports benefiting from BIPAP ASV use. Reported sleepiness is  Improved and the Epworth Sleepiness Score is 3 out of 24. The patient occasionally  takes naps. Prior to PAP use the patient states that his wife reported witnessed apnea and snoring. He would experience excessive daytime sleepiness. Patient states he is very reliant on the machine and won't go a night without it.  The patient complains of the following: none at this time.  The download shows 99% compliance average use time of 7:42 hours. The AHI is 1.5  The patient does not complain  of limb movements disrupting sleep.  ROS  General: (-) fever, (-) chills, (-) night sweat Nose and Sinuses: (-) nasal stuffiness or itchiness, (-) postnasal drip, (-) nosebleeds, (-) sinus trouble. Mouth and Throat: (-) sore throat, (-) hoarseness. Neck: (-) swollen glands, (-) enlarged thyroid, (-) neck pain. Respiratory: - cough, - shortness of breath, - wheezing. Neurologic: - numbness, - tingling. Psychiatric: - anxiety, - depression   Current Medication: Outpatient Encounter Medications as of 12/31/2020  Medication Sig Note  . allopurinol (ZYLOPRIM) 100 MG tablet Take 1 tablet by mouth daily.   Marland Kitchen apixaban (ELIQUIS) 5 MG TABS tablet Take by mouth.   . propranolol (INDERAL) 10 MG tablet Take 1 tablet by mouth 2 (two) times daily.   . temazepam (RESTORIL) 15 MG capsule Take by mouth.   Marland Kitchen allopurinol (ZYLOPRIM) 100 MG tablet Take 100 mg by mouth every morning.    Marland Kitchen apixaban (ELIQUIS) 5  MG TABS tablet Take 1 tablet (5 mg total) by mouth 2 (two) times daily. Take 10 mg (two pills) twice daily for seven days, then take 65m (one pill) twice daily. (Patient taking differently: Take 5 mg by mouth 2 (two) times daily. )   . budesonide-formoterol (SYMBICORT) 160-4.5 MCG/ACT inhaler Inhale 2 puffs into the lungs 2 (two) times daily.   . Cholecalciferol (VITAMIN D) 50 MCG (2000 UT) tablet Take 4,000 Units by mouth daily.   . citalopram (CELEXA) 20 MG tablet Take 20 mg by mouth every morning.    . clindamycin (CLEOCIN-T) 1 % external solution Apply 1 application topically 2 (two) times daily as needed (for breakout).    . diazepam (VALIUM) 5 MG tablet Take 1 tablet (5 mg total) by mouth every 12 (twelve) hours as needed for muscle spasms.   . indapamide (LOZOL) 2.5 MG tablet Take 2.5 mg by mouth every other day.    . Omega-3 Fatty Acids (FISH OIL) 1200 MG CAPS Take 1,200 mg by mouth 2 (two) times daily.    . propranolol (INDERAL) 20 MG tablet Take 20 mg by mouth 2 (two) times daily.    . tadalafil (CIALIS) 5 MG tablet Take 10 mg by mouth daily as needed for erectile dysfunction.    . temazepam (RESTORIL) 15 MG capsule Take 15 mg by mouth at bedtime.    . vitamin B-12 (CYANOCOBALAMIN) 500 MCG tablet Take 500 mcg by mouth daily.   . [DISCONTINUED] azithromycin (ZITHROMAX) 250 MG tablet Take 250 mg  by mouth every evening. 10/05/2019: TAKES FOR ROSACEA   No facility-administered encounter medications on file as of 12/31/2020.    Surgical History: Past Surgical History:  Procedure Laterality Date  . ANTERIOR CRUCIATE LIGAMENT (ACL) REVISION    . broken nose surgery    . BUNIONECTOMY Bilateral   . CATARACT EXTRACTION W/PHACO Right 02/09/2018   Procedure: CATARACT EXTRACTION PHACO AND INTRAOCULAR LENS PLACEMENT (IOC);  Surgeon: Birder Robson, MD;  Location: [facility];  Service: Ophthalmology;  Laterality: Right;  [place] 00:45 AP% 14.4 CDE 6.58 Fluid pack lot # [id number]  . CATARACT EXTRACTION  W/PHACO Left 12/14/2018   Procedure: CATARACT EXTRACTION PHACO AND INTRAOCULAR LENS PLACEMENT (IOC LEFT;  Surgeon: Birder Robson, MD;  Location: [place];  Service: Ophthalmology;  Laterality: Left;  . FLEXIBLE BRONCHOSCOPY N/A 04/30/2017   Procedure: FLEXIBLE BRONCHOSCOPY;  Surgeon: Laverle Hobby, MD;  Location: [facility];  Service: Pulmonary;  Laterality: N/A;  . KIDNEY STONE SURGERY    . MENISCUS REPAIR Bilateral    right knee x 3, left knee x 2  . SHOULDER ARTHROSCOPY WITH ROTATOR CUFF REPAIR AND SUBACROMIAL DECOMPRESSION Right 02/01/2019   Procedure: Extensive arthroscopic debridement, arthroscopic subacromial decompression, mini-open rotator cuff repair supplemented by a Tamala Julian & Nephew Regeneten patch, and mini-open biceps tenodesis, right shoulder.;  Surgeon: Corky Mull, MD;  Location: [facility];  Service: Orthopedics;  Laterality: Right;  . TOE AMPUTATION Bilateral    index toes    Medical History: Past Medical History:  Diagnosis Date  . Cancer ([place])    SKIN  . Cirrhosis ([place])    POSSIBLE  . COPD (chronic obstructive pulmonary disease) ([place])   . Depression   . Dysrhythmia    a-fib  . Edema    right FEET/LEG  . Fatty liver   . GERD (gastroesophageal reflux disease)    h/o  . Heart murmur   . History of kidney stones   . HOH (hard of hearing)   . Hypertension   . Pneumonia    January/February 2020  . Pulmonary embolism ([place])   . Pulmonary nodules   . Reflux   . Rosacea   . Sleep apnea    cpap  . Tremors of nervous system   . Wears hearing aid     Family History: Non contributory to the present illness  Social History: Social History   Socioeconomic History  . Marital status: Married    Spouse name: Not on file  . Number of children: Not on file  . Years of education: Not on file  . Highest education level: Not on file  Occupational History  . Not on file  Tobacco Use  . Smoking status: Never Smoker  . Smokeless tobacco: Never  Used  Vaping Use  . Vaping Use: Never used  Substance and Sexual Activity  . Alcohol use: Yes    Comment: very rare  . Drug use: Yes    Types: Marijuana    Comment: EDIBLES  . Sexual activity: Not on file  Other Topics Concern  . Not on file  Social History Narrative  . Not on file   Social Determinants of Health   Financial Resource Strain: Not on file  Food Insecurity: Not on file  Transportation Needs: Not on file  Physical Activity: Not on file  Stress: Not on file  Social Connections: Not on file  Intimate Partner Violence: Not on file    Vital Signs: Blood pressure 110/60, pulse 61, resp. rate 14, height 6'  4" (1.93 m), weight 229 lb (103.9 kg), SpO2 93 %.  Examination: General Appearance: The patient is well-developed, well-nourished, and in no distress. Neck Circumference: 42 Skin: Gross inspection of skin unremarkable. Head: normocephalic, no gross deformities. Eyes: no gross deformities noted. ENT: ears appear grossly normal Neurologic: Alert and oriented. No involuntary movements.    EPWORTH SLEEPINESS SCALE:  Scale:  (0)= no chance of dozing; (1)= slight chance of dozing; (2)= moderate chance of dozing; (3)= high chance of dozing  Chance  Situtation    Sitting and reading: 1    Watching TV: 1    Sitting Inactive in public: 0    As a passenger in car: 0      Lying down to rest: 1    Sitting and talking: 0    Sitting quielty after lunch: 0    In a car, stopped in traffic: 0   TOTAL SCORE:   3 out of 24    SLEEP STUDIES:  1. PSG 01/22/2005 - AHI 16.7, Supine AHI 21.6,  REM AHI 17.1,  Low SpO2  84%   CPAP COMPLIANCE DATA:  Date Range: 12/29/19 - 12/27/20  Average Daily Use: 7:42 hours  Median Use: 7:49 hours  Compliance for > 4 Hours: 995 days  AHI: 1.5 respiratory events per hour  Days Used: 365/365 days  Mask Leak: 22.3 Lpm  95th Percentile Pressure: ASV settings: EPAP 5cmH2O, min PS 3cmH2O, max PS 15  cmH2O         LABS: No results found for this or any previous visit (from the past 2160 hour(s)).  Radiology: CT HEAD WO CONTRAST  Result Date: 09/07/2020 CLINICAL DATA:  Recent fall, hit head EXAM: CT HEAD WITHOUT CONTRAST TECHNIQUE: Contiguous axial images were obtained from the base of the skull through the vertex without intravenous contrast. COMPARISON:  None. FINDINGS: Brain: There is no acute intracranial hemorrhage, mass effect, or edema. Gray-white differentiation is preserved. There is no extra-axial fluid collection. Patchy hypoattenuation in the supratentorial white matter is nonspecific but may reflect mild to moderate chronic microvascular ischemic changes. Prominence of the ventricles and sulci reflects mild generalized parenchymal volume loss. Vascular: There is atherosclerotic calcification at the skull base. Skull: Calvarium is unremarkable. Sinuses/Orbits: Small right sphenoid sinus retention cyst or polyp. No acute orbital finding. Other: None. IMPRESSION: No evidence of acute intracranial injury. Chronic microvascular ischemic changes. Electronically Signed   By: Macy Mis M.D.   On: 09/07/2020 10:30    No results found.  No results found.    Assessment and Plan: Patient Active Problem List   Diagnosis Date Noted  . OSA treated with BiPAP 12/31/2020  . Hypertension 12/31/2020  . Actinic keratosis 01/03/2019  . Decreased left ventricular function 01/03/2019  . Senile hyperkeratosis 01/03/2019  . Atrial fibrillation ([place]) 10/05/2018  . Bilateral lower extremity edema 01/19/2018  . Blue toes 01/19/2018  . History of pulmonary embolism 07/14/2017  . Personal history of gout 07/14/2017  . Closed compression fracture of L1 vertebra ([place]) 07/15/2016  . 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 History of urinary stone 02/26/2015  . Erectile dysfunction 06/07/2014  . Hyperlipidemia 06/06/2014  . CAFL (chronic airflow limitation) ([place]) 01/02/2014  . Clinical depression  01/02/2014  . Acid reflux 01/02/2014  . Gout 01/02/2014  . BP (high blood pressure) 01/02/2014  . Intermittent tremor 01/02/2014  . Obstructive apnea 01/02/2014  . Acne erythematosa 01/02/2014  . Abnormal involuntary movement 01/02/2014  . Major depressive disorder, single episode 01/02/2014  . Calculus of  kidney 11/23/2013  . Hydronephrosis 11/23/2013  . Renal colic 07/37/1062  . Calculi, ureter 11/23/2013   1. OSA treated with BiPAP The patient does tolerate PAP and reports definite benefit from PAP use. The patient was reminded how to clean equipment  and advised to replace supplies routinely. The patient was also counselled on weight loss. The compliance is excellent. . The AHI is 1.5. OSA- continue excellent compliance.     2. Chronic atrial fibrillation (HCC) Stable. Continue with eliquis and propranolol. Emphasized importance of treating apnea.   3. Hypertension, unspecified type Hypertension Counseling:   The following hypertensive lifestyle modification were recommended and discussed:  1. Limiting alcohol intake to less than 1 oz/day of ethanol:(24 oz of beer or 8 oz of wine or 2 oz of 100-proof whiskey). 2. Take baby ASA 81 mg daily. 3. Importance of regular aerobic exercise and losing weight. 4. Reduce dietary saturated fat and cholesterol intake for overall cardiovascular health. 5. Maintaining adequate dietary potassium, calcium, and magnesium intake. 6. Regular monitoring of the blood pressure. 7. Reduce sodium intake to less than 100 mmol/day (less than 2.3 gm of sodium or less than 6 gm of sodium choride)   4. Overweight (BMI 25.0-29.9)  Counseling: Had a lengthy discussion regarding patients BMI and weight issues. Patient was instructed on portion control as well as increased activity. Also discussed caloric restrictions with trying to maintain intake less than 2000 Kcal. Discussions were made in accordance with the 5As of weight management. Simple actions such as  not eating late and if able to, taking a walk is suggested.   General Counseling: I have discussed the findings of the evaluation and examination with Gwenlyn Perking.  I have also discussed any further diagnostic evaluation thatmay be needed or ordered today. Clarance verbalizes understanding of the findings of todays visit. We also reviewed his medications today and discussed drug interactions and side effects including but not limited excessive drowsiness and altered mental states. We also discussed that there is always a risk not just to him but also people around him. he has been encouraged to call the office with any questions or concerns that should arise related to todays visit.  No orders of the defined types were placed in this encounter.       I have personally obtained a history, examined the patient, evaluated laboratory and imaging results, formulated the assessment and plan and placed orders.   This patient was seen today by Tressie Ellis, PA-C in collaboration with Dr. Devona Konig.    Allyne Gee, MD [facility] Diplomate ABMS Pulmonary and Critical Care Medicine Sleep medicine

## 2020-12-31 ENCOUNTER — Ambulatory Visit (INDEPENDENT_AMBULATORY_CARE_PROVIDER_SITE_OTHER): Payer: Medicare Other | Admitting: Internal Medicine

## 2020-12-31 VITALS — BP 110/60 | HR 61 | Resp 14 | Ht 76.0 in | Wt 229.0 lb

## 2020-12-31 DIAGNOSIS — E663 Overweight: Secondary | ICD-10-CM | POA: Diagnosis not present

## 2020-12-31 DIAGNOSIS — I482 Chronic atrial fibrillation, unspecified: Secondary | ICD-10-CM

## 2020-12-31 DIAGNOSIS — I10 Essential (primary) hypertension: Secondary | ICD-10-CM | POA: Diagnosis not present

## 2020-12-31 DIAGNOSIS — G4733 Obstructive sleep apnea (adult) (pediatric): Secondary | ICD-10-CM | POA: Diagnosis not present

## 2020-12-31 NOTE — Patient Instructions (Signed)

## 2021-04-26 ENCOUNTER — Other Ambulatory Visit: Payer: Self-pay | Admitting: Gastroenterology

## 2021-04-26 DIAGNOSIS — K7460 Unspecified cirrhosis of liver: Secondary | ICD-10-CM

## 2021-05-08 ENCOUNTER — Ambulatory Visit
Admission: RE | Admit: 2021-05-08 | Discharge: 2021-05-08 | Disposition: A | Discharge status: Home / Self Care | Payer: Medicare Other | Source: Ambulatory Visit | Attending: Gastroenterology | Admitting: Gastroenterology

## 2021-05-08 ENCOUNTER — Other Ambulatory Visit: Payer: Self-pay

## 2021-05-08 DIAGNOSIS — K7460 Unspecified cirrhosis of liver: Secondary | ICD-10-CM | POA: Insufficient documentation

## 2021-06-25 NOTE — H&P (Signed)
Entered in error, procedure canceled

## 2021-09-13 ENCOUNTER — Encounter: Payer: Self-pay | Admitting: Gastroenterology

## 2021-09-15 ENCOUNTER — Encounter: Payer: Self-pay | Admitting: Gastroenterology

## 2021-09-15 NOTE — H&P (Signed)
Pre-Procedure H&P   Patient ID: Billy Freeman. is a 71 y.o. male.  Gastroenterology Provider: Annamaria Helling, DO  Referring Provider: Octavia Bruckner, PA PCP: Tracie Harrier, MD  Date: 09/16/2021  HPI [name]. is a 71 y.o. male who presents today for Esophagogastroduodenoscopy and Colonoscopy for esophageal varices screening; surveillance colonsocopy for phx polyps.  Patient on Eliquis which has been held for this procedure. Patient with ongoing GERD.  With history of compensated cirrhosis.  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  Denies any hematemesis coffee-ground emesis abdominal distention or pain.  No other nausea or vomiting.  Bowel movements have been regular with no melena or hematochezia.  Patient with personal history of adenomatous colon polyps. Underwent last colonoscopy in June 2017 demonstrating 1 tubular adenoma and sigmoid diverticulosis.  Also underwent colonoscopies in 2002, 2009 and 2012  Family history of colon polyps mother and sister  Status postcholecystectomy   Past Medical History:  Diagnosis Date   Atrial fibrillation ([place])    Basal cell carcinoma    Cancer ([place])    SKIN   Cirrhosis ([place])    POSSIBLE   COPD (chronic obstructive pulmonary disease) (HCC)    Depression    Diverticulosis    Dysrhythmia    a-fib   Edema    right FEET/LEG   Fatty liver    GERD (gastroesophageal reflux disease)    h/o   Glaucoma (increased eye pressure)    Gout    Heart murmur    History of kidney stones    HOH (hard of hearing)    Hypertension    Pneumonia    January/February 2020   Pulmonary embolism (HCC)    Pulmonary nodules    Reflux    Rosacea    Sleep apnea    cpap   Tremors of nervous system    Wears hearing aid     Past Surgical History:  Procedure Laterality Date   ANTERIOR CRUCIATE LIGAMENT (ACL) REVISION     broken nose surgery     BRONCHOSCOPY     BUNIONECTOMY Bilateral    CATARACT EXTRACTION  W/PHACO Right 02/09/2018   Procedure: CATARACT EXTRACTION PHACO AND INTRAOCULAR LENS PLACEMENT ([place]);  Surgeon: Birder Robson, MD;  Location: [facility];  Service: Ophthalmology;  Laterality: Right;  [place] 00:45 AP% 14.4 CDE 6.58 Fluid pack lot # [id number]   CATARACT EXTRACTION W/PHACO Left 12/14/2018   Procedure: CATARACT EXTRACTION PHACO AND INTRAOCULAR LENS PLACEMENT (IOC LEFT;  Surgeon: Birder Robson, MD;  Location: [place];  Service: Ophthalmology;  Laterality: Left;   CHOLECYSTECTOMY     EYE SURGERY     FLEXIBLE BRONCHOSCOPY N/A 04/30/2017   Procedure: FLEXIBLE BRONCHOSCOPY;  Surgeon: Laverle Hobby, MD;  Location: [facility];  Service: Pulmonary;  Laterality: N/A;   KIDNEY STONE SURGERY     LIVER BIOPSY     MENISCUS REPAIR Bilateral    right knee x 3, left knee x 2   SHOULDER ARTHROSCOPY WITH ROTATOR CUFF REPAIR AND SUBACROMIAL DECOMPRESSION Right 02/01/2019   Procedure: Extensive arthroscopic debridement, arthroscopic subacromial decompression, mini-open rotator cuff repair supplemented by a Smith [employer] Nephew Regeneten patch, and mini-open biceps tenodesis, right shoulder.;  Surgeon: Corky Mull, MD;  Location: [facility];  Service: Orthopedics;  Laterality: Right;   TOE AMPUTATION Bilateral    index toes    Family History Mother and sister with colon polyps No h/o GI disease or malignancy  Review of Systems  Constitutional:  Negative for activity change, appetite change, chills, diaphoresis, fatigue, fever and unexpected weight change.  HENT:  Negative for trouble swallowing and voice change.   Respiratory:  Negative for shortness of breath and wheezing.   Cardiovascular:  Negative for chest pain, palpitations and leg swelling.  Gastrointestinal:  Negative for abdominal distention, abdominal pain, anal bleeding, blood in stool, constipation, diarrhea, nausea and vomiting.  Musculoskeletal:  Negative for arthralgias and myalgias.  Skin:  Negative for color  change and pallor.  Neurological:  Negative for dizziness, syncope and weakness.  Psychiatric/Behavioral:  Negative for confusion. The patient is not nervous/anxious.   All other systems reviewed and are negative.   Medications No current facility-administered medications on file prior to encounter.   Current Outpatient Medications on File Prior to Encounter  Medication Sig Dispense Refill   allopurinol (ZYLOPRIM) 100 MG tablet Take 1 tablet by mouth daily.     apixaban (ELIQUIS) 5 MG TABS tablet Take 1 tablet (5 mg total) by mouth 2 (two) times daily. Take 10 mg (two pills) twice daily for seven days, then take 68m (one pill) twice daily. (Patient taking differently: Take 5 mg by mouth 2 (two) times daily.) 60 tablet 0   apixaban (ELIQUIS) 5 MG TABS tablet Take by mouth.     budesonide-formoterol (SYMBICORT) 160-4.5 MCG/ACT inhaler Inhale 2 puffs into the lungs 2 (two) times daily.     Cholecalciferol (VITAMIN D) 50 MCG (2000 UT) tablet Take 4,000 Units by mouth daily.     citalopram (CELEXA) 20 MG tablet Take 20 mg by mouth every morning.      clindamycin (CLEOCIN T) 1 % external solution Apply 1 application topically 2 (two) times daily as needed (for breakout).      diazepam (VALIUM) 5 MG tablet Take 1 tablet (5 mg total) by mouth every 12 (twelve) hours as needed for muscle spasms. 5 tablet 0   indapamide (LOZOL) 2.5 MG tablet Take 2.5 mg by mouth every other day.      Omega-3 Fatty Acids (FISH OIL) 1200 MG CAPS Take 1,200 mg by mouth 2 (two) times daily.      propranolol (INDERAL) 10 MG tablet Take 1 tablet by mouth 2 (two) times daily.     tadalafil (CIALIS) 5 MG tablet Take 10 mg by mouth daily as needed for erectile dysfunction.      temazepam (RESTORIL) 15 MG capsule Take 15 mg by mouth at bedtime.      vitamin B-12 (CYANOCOBALAMIN) 500 MCG tablet Take 500 mcg by mouth daily.     allopurinol (ZYLOPRIM) 100 MG tablet Take 100 mg by mouth every morning.      propranolol (INDERAL) 20  MG tablet Take 20 mg by mouth 2 (two) times daily.  (Patient not taking: Reported on 09/13/2021)     temazepam (RESTORIL) 15 MG capsule Take by mouth.      Pertinent medications related to GI and procedure were reviewed by me with the patient prior to the procedure   Current Facility-Administered Medications:    0.9 %  sodium chloride infusion, , Intravenous, Continuous, RAnnamaria Helling DO      Allergies  Allergen Reactions   Oxycodone Itching   Allergies were reviewed by me prior to the procedure  Objective    Vitals:   09/16/21 1155  BP: (!) 154/96  Pulse: 75  Resp: 18  Temp: (!) 97.2 F (36.2 C)  SpO2: 97%  Weight: 103.4 kg  Height: _0  (1.93 m)  Physical Exam Vitals and nursing note reviewed.  Constitutional:      General: He is not in acute distress.    Appearance: Normal appearance. He is not ill-appearing, toxic-appearing or diaphoretic.  HENT:     Head: Normocephalic and atraumatic.     Nose: Nose normal.     Mouth/Throat:     Mouth: Mucous membranes are moist.     Pharynx: Oropharynx is clear.  Eyes:     General: No scleral icterus.    Extraocular Movements: Extraocular movements intact.  Cardiovascular:     Rate and Rhythm: Normal rate and regular rhythm.     Heart sounds: Normal heart sounds. No murmur heard.   No friction rub. No gallop.  Pulmonary:     Effort: Pulmonary effort is normal. No respiratory distress.     Breath sounds: Normal breath sounds. No wheezing, rhonchi or rales.  Abdominal:     General: Bowel sounds are normal. There is no distension.     Palpations: Abdomen is soft.     Tenderness: There is no abdominal tenderness. There is no guarding or rebound.  Musculoskeletal:     Cervical back: Neck supple.     Right lower leg: No edema.     Left lower leg: No edema.  Skin:    General: Skin is warm and dry.     Coloration: Skin is not jaundiced or pale.  Neurological:     General: No focal deficit present.     Mental  Status: He is alert and oriented to person, place, and time. Mental status is at baseline.  Psychiatric:        Mood and Affect: Mood normal.        Behavior: Behavior normal.        Thought Content: Thought content normal.        Judgment: Judgment normal.     Assessment:  [name]. is a 71 y.o. male  who presents today for Esophagogastroduodenoscopy and Colonoscopy for esophageal varices screening; surveillance colonsocopy for phx adenomatous polyps.  Plan:  Esophagogastroduodenoscopy and Colonoscopy with possible intervention today  Esophagogastroduodenoscopy and Colonoscopy with possible biopsy, control of bleeding, polypectomy, and interventions as necessary has been discussed with the patient/patient representative. Informed consent was obtained from the patient/patient representative after explaining the indication, nature, and risks of the procedure including but not limited to death, bleeding, perforation, missed neoplasm/lesions, cardiorespiratory compromise, and reaction to medications. Opportunity for questions was given and appropriate answers were provided. Patient/patient representative has verbalized understanding is amenable to undergoing the procedure.   Annamaria Helling, DO  [facility] Gastroenterology  Portions of the record may have been created with voice recognition software. Occasional wrong-word or 'sound-a-like' substitutions may have occurred due to the inherent limitations of voice recognition software.  Read the chart carefully and recognize, using context, where substitutions may have occurred.

## 2021-09-16 ENCOUNTER — Ambulatory Visit: Payer: Medicare Other | Admitting: Registered Nurse

## 2021-09-16 ENCOUNTER — Encounter
Admission: RE | Disposition: A | Discharge status: Home / Self Care | Payer: Self-pay | Source: Home / Self Care | Attending: Gastroenterology

## 2021-09-16 ENCOUNTER — Other Ambulatory Visit: Payer: Self-pay

## 2021-09-16 ENCOUNTER — Encounter: Payer: Self-pay | Admitting: Gastroenterology

## 2021-09-16 ENCOUNTER — Ambulatory Visit
Admission: RE | Admit: 2021-09-16 | Discharge: 2021-09-16 | Disposition: A | Discharge status: Home / Self Care | Payer: Medicare Other | Attending: Gastroenterology | Admitting: Gastroenterology

## 2021-09-16 DIAGNOSIS — K219 Gastro-esophageal reflux disease without esophagitis: Secondary | ICD-10-CM | POA: Diagnosis not present

## 2021-09-16 DIAGNOSIS — D123 Benign neoplasm of transverse colon: Secondary | ICD-10-CM | POA: Insufficient documentation

## 2021-09-16 DIAGNOSIS — K319 Disease of stomach and duodenum, unspecified: Secondary | ICD-10-CM | POA: Insufficient documentation

## 2021-09-16 DIAGNOSIS — Z7901 Long term (current) use of anticoagulants: Secondary | ICD-10-CM | POA: Diagnosis not present

## 2021-09-16 DIAGNOSIS — K2980 Duodenitis without bleeding: Secondary | ICD-10-CM | POA: Diagnosis not present

## 2021-09-16 DIAGNOSIS — D122 Benign neoplasm of ascending colon: Secondary | ICD-10-CM | POA: Insufficient documentation

## 2021-09-16 DIAGNOSIS — F1290 Cannabis use, unspecified, uncomplicated: Secondary | ICD-10-CM | POA: Diagnosis not present

## 2021-09-16 DIAGNOSIS — I4891 Unspecified atrial fibrillation: Secondary | ICD-10-CM | POA: Insufficient documentation

## 2021-09-16 DIAGNOSIS — D124 Benign neoplasm of descending colon: Secondary | ICD-10-CM | POA: Diagnosis not present

## 2021-09-16 DIAGNOSIS — F32A Depression, unspecified: Secondary | ICD-10-CM | POA: Insufficient documentation

## 2021-09-16 DIAGNOSIS — Z87891 Personal history of nicotine dependence: Secondary | ICD-10-CM | POA: Diagnosis not present

## 2021-09-16 DIAGNOSIS — G4730 Sleep apnea, unspecified: Secondary | ICD-10-CM | POA: Insufficient documentation

## 2021-09-16 DIAGNOSIS — Z1211 Encounter for screening for malignant neoplasm of colon: Secondary | ICD-10-CM | POA: Insufficient documentation

## 2021-09-16 DIAGNOSIS — J449 Chronic obstructive pulmonary disease, unspecified: Secondary | ICD-10-CM | POA: Diagnosis not present

## 2021-09-16 DIAGNOSIS — K760 Fatty (change of) liver, not elsewhere classified: Secondary | ICD-10-CM | POA: Insufficient documentation

## 2021-09-16 DIAGNOSIS — K589 Irritable bowel syndrome without diarrhea: Secondary | ICD-10-CM | POA: Diagnosis not present

## 2021-09-16 DIAGNOSIS — Z86711 Personal history of pulmonary embolism: Secondary | ICD-10-CM | POA: Insufficient documentation

## 2021-09-16 DIAGNOSIS — K640 First degree hemorrhoids: Secondary | ICD-10-CM | POA: Diagnosis not present

## 2021-09-16 DIAGNOSIS — K5730 Diverticulosis of large intestine without perforation or abscess without bleeding: Secondary | ICD-10-CM | POA: Diagnosis not present

## 2021-09-16 DIAGNOSIS — I10 Essential (primary) hypertension: Secondary | ICD-10-CM | POA: Diagnosis not present

## 2021-09-16 DIAGNOSIS — Z86010 Personal history of colonic polyps: Secondary | ICD-10-CM | POA: Diagnosis not present

## 2021-09-16 HISTORY — PX: COLONOSCOPY: SHX5424

## 2021-09-16 HISTORY — PX: ESOPHAGOGASTRODUODENOSCOPY: SHX5428

## 2021-09-16 HISTORY — DX: Type 2 diabetes mellitus without complications: E11.9

## 2021-09-16 HISTORY — DX: Unspecified atrial fibrillation: I48.91

## 2021-09-16 HISTORY — DX: Gout, unspecified: M10.9

## 2021-09-16 HISTORY — DX: Diverticulosis of intestine, part unspecified, without perforation or abscess without bleeding: K57.90

## 2021-09-16 HISTORY — DX: Unspecified glaucoma: H40.9

## 2021-09-16 HISTORY — DX: Basal cell carcinoma of skin, unspecified: C44.91

## 2021-09-16 SURGERY — COLONOSCOPY
Anesthesia: General

## 2021-09-16 MED ORDER — LIDOCAINE HCL (CARDIAC) PF 100 MG/5ML IV SOSY
PREFILLED_SYRINGE | INTRAVENOUS | Status: DC | PRN
  Administered 2021-09-16: 80 mg via INTRAVENOUS

## 2021-09-16 MED ORDER — SODIUM CHLORIDE 0.9 % IV SOLN: INTRAVENOUS | Status: DC

## 2021-09-16 MED ORDER — PROPOFOL 10 MG/ML IV BOLUS
INTRAVENOUS | Status: DC | PRN
  Administered 2021-09-16: 30 mg via INTRAVENOUS
  Administered 2021-09-16: 100 mg via INTRAVENOUS

## 2021-09-16 MED ORDER — EPHEDRINE 5 MG/ML INJ
INTRAVENOUS | Status: AC
  Filled 2021-09-16: qty 5

## 2021-09-16 MED ORDER — PHENYLEPHRINE 40 MCG/ML (10ML) SYRINGE FOR IV PUSH (FOR BLOOD PRESSURE SUPPORT)
PREFILLED_SYRINGE | INTRAVENOUS | Status: AC
Start: 2021-09-16 — End: ?
  Filled 2021-09-16: qty 10

## 2021-09-16 MED ORDER — PHENYLEPHRINE HCL (PRESSORS) 10 MG/ML IV SOLN
INTRAVENOUS | Status: DC | PRN
Start: 2021-09-16 — End: 2021-09-16
  Administered 2021-09-16: 120 ug via INTRAVENOUS
  Administered 2021-09-16 (×2): 40 ug via INTRAVENOUS
  Administered 2021-09-16: 80 ug via INTRAVENOUS

## 2021-09-16 MED ORDER — PROPOFOL 500 MG/50ML IV EMUL
INTRAVENOUS | Status: DC | PRN
  Administered 2021-09-16: 150 ug/kg/min via INTRAVENOUS

## 2021-09-16 MED ORDER — EPHEDRINE SULFATE (PRESSORS) 50 MG/ML IJ SOLN
INTRAMUSCULAR | Status: DC | PRN
  Administered 2021-09-16 (×2): 5 mg via INTRAVENOUS

## 2021-09-16 MED ORDER — HYDRALAZINE HCL 20 MG/ML IJ SOLN
INTRAMUSCULAR | Status: AC
  Filled 2021-09-16: qty 1

## 2021-09-16 MED ORDER — PROPOFOL 500 MG/50ML IV EMUL
INTRAVENOUS | Status: AC
  Filled 2021-09-16: qty 100

## 2021-09-16 NOTE — Anesthesia Postprocedure Evaluation (Signed)
Anesthesia Post Note  Patient: Billy Freeman.  Procedure(s) Performed: COLONOSCOPY ESOPHAGOGASTRODUODENOSCOPY (EGD)  Patient location during evaluation: Endoscopy Anesthesia Type: General Level of consciousness: awake and alert Pain management: pain level controlled Vital Signs Assessment: post-procedure vital signs reviewed and stable Respiratory status: spontaneous breathing, nonlabored ventilation, respiratory function stable and patient connected to nasal cannula oxygen Cardiovascular status: blood pressure returned to baseline and stable Postop Assessment: no apparent nausea or vomiting Anesthetic complications: no   No notable events documented.   Last Vitals:  Vitals:   09/16/21 1356 09/16/21 1357  BP: (!) 132/95   Pulse: 84   Resp: 15   Temp: (!) 36.1 C 36.8 C  SpO2: 97%     Last Pain:  Vitals:   09/16/21 1416  TempSrc:   PainSc: 0-No pain                 Martha Clan

## 2021-09-16 NOTE — Op Note (Signed)
[facility] Patient Name: Billy Freeman Procedure Date: 09/16/2021 11:45 AM MRN: [id number] Account #: [id number] Date of Birth: 1951-03-25 Admit Type: Outpatient Age: 71 Room: [facility] ENDO ROOM 2 Gender: Male Note Status: Finalized Instrument Name: Upper Endoscope [id number] Procedure:             Upper GI endoscopy Indications:           Variceal screening (no known varices or prior bleeding) Providers:             Rueben Bash, DO Referring MD:          Tracie Harrier, MD (Referring MD) Medicines:             Monitored Anesthesia Care Complications:         No immediate complications. Estimated blood loss:                         Minimal. Procedure:             Pre-Anesthesia Assessment:                        - Prior to the procedure, a History and Physical was                         performed, and patient medications and allergies were                         reviewed. The patient is competent. The risks and                         benefits of the procedure and the sedation options and                         risks were discussed with the patient. All questions                         were answered and informed consent was obtained.                         Patient identification and proposed procedure were                         verified by the physician, the nurse, the anesthetist                         and the technician in the endoscopy suite. Mental                         Status Examination: alert and oriented. Airway                         Examination: normal oropharyngeal airway and neck                         mobility. Respiratory Examination: clear to                         auscultation. CV Examination: RRR, no murmurs, no S3  or S4. Prophylactic Antibiotics: The patient does not                         require prophylactic antibiotics. Prior                         Anticoagulants: The patient has  taken no previous                         anticoagulant or antiplatelet agents. ASA Grade                         Assessment: III - A patient with severe systemic                         disease. After reviewing the risks and benefits, the                         patient was deemed in satisfactory condition to                         undergo the procedure. The anesthesia plan was to use                         monitored anesthesia care (MAC). Immediately prior to                         administration of medications, the patient was                         re-assessed for adequacy to receive sedatives. The                         heart rate, respiratory rate, oxygen saturations,                         blood pressure, adequacy of pulmonary ventilation, and                         response to care were monitored throughout the                         procedure. The physical status of the patient was                         re-assessed after the procedure.                        After obtaining informed consent, the endoscope was                         passed under direct vision. Throughout the procedure,                         the patient's blood pressure, pulse, and oxygen                         saturations were monitored continuously. The Endoscope  was introduced through the mouth, and advanced to the                         second part of duodenum. The upper GI endoscopy was                         accomplished without difficulty. The patient tolerated                         the procedure well. Findings:      Localized moderate inflammation characterized by erosions and erythema       was found in the duodenal bulb. Estimated blood loss: none.      Localized moderate inflammation characterized by erosions and erythema       was found in the gastric antrum. Biopsies were taken with a cold forceps       for Helicobacter pylori testing. Estimated blood loss was  minimal.      The entire examined stomach was normal. No gastric varices, GAVE, or       portal gastropathy. Otherwise normal stomach.      The Z-line was regular. Estimated blood loss: none.      Esophagogastric landmarks were identified: the gastroesophageal junction       was found at 42 cm from the incisors.      The exam of the esophagus was otherwise normal.      No esophageal varices. Estimated blood loss: none. Impression:            - Duodenitis.                        - Gastritis. Biopsied.                        - Normal stomach.                        - Z-line regular.                        - Esophagogastric landmarks identified. Recommendation:        - Discharge patient to home.                        - Resume previous diet.                        - No aspirin, ibuprofen, naproxen, or other                         non-steroidal anti-inflammatory drugs.                        - Continue present medications.                        - start acid suppression if not taking already with ppi                        - Await pathology results.                        - Return to GI clinic as  previously scheduled.                        - The findings and recommendations were discussed with                         the patient. Procedure Code(s):     --- Professional ---                        [phone], Esophagogastroduodenoscopy, flexible,                         transoral; with biopsy, single or multiple Diagnosis Code(s):     --- Professional ---                        K29.80, Duodenitis without bleeding                        K29.70, Gastritis, unspecified, without bleeding                        Z13.810, Encounter for screening for upper                         gastrointestinal disorder CPT copyright 2019 American [employer] Association. All rights reserved. The codes documented in this report are preliminary and upon coder review may  be revised to meet current compliance  requirements. Attending Participation:      I personally performed the entire procedure. Volney American, DO Annamaria Helling DO, DO 09/16/2021 12:50:24 PM This report has been signed electronically. Number of Addenda: 0 Note Initiated On: 09/16/2021 11:45 AM Estimated Blood Loss:  Estimated blood loss was minimal.      [facility]

## 2021-09-16 NOTE — Transfer of Care (Signed)
Immediate Anesthesia Transfer of Care Note  Patient: Billy Freeman.  Procedure(s) Performed: COLONOSCOPY ESOPHAGOGASTRODUODENOSCOPY (EGD)  Patient Location: Endoscopy Unit  Anesthesia Type:General  Level of Consciousness: drowsy  Airway & Oxygen Therapy: Patient Spontanous Breathing  Post-op Assessment: Report given to RN and Post -op Vital signs reviewed and stable  Post vital signs: Reviewed and stable  Last Vitals:  Vitals Value Taken Time  BP 132/95 09/16/21 1356  Temp 36.1 C 09/16/21 1356  Pulse 78 09/16/21 1359  Resp 19 09/16/21 1359  SpO2 97 % 09/16/21 1359  Vitals shown include unvalidated device data.  Last Pain:  Vitals:   09/16/21 1356  TempSrc: Temporal  PainSc:          Complications: No notable events documented.

## 2021-09-16 NOTE — Interval H&P Note (Signed)
History and Physical Interval Note: Preprocedure H&P from 09/16/21  was reviewed and there was no interval change after seeing and examining the patient.  Written consent was obtained from the patient after discussion of risks, benefits, and alternatives. Patient has consented to proceed with Esophagogastroduodenoscopy and Colonoscopy with possible intervention   09/16/2021 12:33 PM  Billy Freeman.  has presented today for surgery, with the diagnosis of HX COLON POLYPS CIRRHOSIS OF LIVER.  The various methods of treatment have been discussed with the patient and family. After consideration of risks, benefits and other options for treatment, the patient has consented to  Procedure(s): COLONOSCOPY (N/A) ESOPHAGOGASTRODUODENOSCOPY (EGD) (N/A) as a surgical intervention.  The patient's history has been reviewed, patient examined, no change in status, stable for surgery.  I have reviewed the patient's chart and labs.  Questions were answered to the patient's satisfaction.     Annamaria Helling

## 2021-09-16 NOTE — Anesthesia Preprocedure Evaluation (Signed)
Anesthesia Evaluation  Patient identified by MRN, date of birth, ID band Patient awake    Reviewed: Allergy & Precautions, H&P , NPO status , Patient's Chart, lab work & pertinent test results  History of Anesthesia Complications (+) history of anesthetic complications (3 weeks of back pain after epidural)  Airway Mallampati: III  TM Distance: <3 FB Neck ROM: limited    Dental  (+) Poor Dentition, Chipped, Dental Advidsory Given   Pulmonary shortness of breath and with exertion, sleep apnea , pneumonia, COPD, neg recent URI, former smoker,           Cardiovascular Exercise Tolerance: Good hypertension, (-) angina(-) Past MI, (-) Cardiac Stents and (-) DOE + dysrhythmias Atrial Fibrillation + Valvular Problems/Murmurs      Neuro/Psych PSYCHIATRIC DISORDERS Depression negative neurological ROS     GI/Hepatic negative GI ROS, GERD  ,(+)     substance abuse  marijuana use, Hepatitis -  Endo/Other  negative endocrine ROS  Renal/GU Renal disease     Musculoskeletal   Abdominal   Peds  Hematology negative hematology ROS (+)   Anesthesia Other Findings Past Medical History: No date: Cancer ([place])     Comment:  SKIN No date: Cirrhosis ([place])     Comment:  POSSIBLE No date: COPD (chronic obstructive pulmonary disease) (HCC) No date: Depression No date: Dysrhythmia     Comment:  a-fib No date: Edema     Comment:  right FEET/LEG No date: Fatty liver No date: GERD (gastroesophageal reflux disease)     Comment:  h/o No date: Heart murmur No date: History of kidney stones No date: HOH (hard of hearing) No date: Hypertension No date: Pneumonia     Comment:  January/February 2020 No date: Pulmonary embolism (HCC) No date: Pulmonary nodules No date: Reflux No date: Rosacea No date: Sleep apnea     Comment:  cpap No date: Tremors of nervous system No date: Wears hearing aid  Past Surgical History: No date:  ANTERIOR CRUCIATE LIGAMENT (ACL) REVISION No date: broken nose surgery No date: BUNIONECTOMY; Bilateral 02/09/2018: CATARACT EXTRACTION W/PHACO; Right     Comment:  Procedure: CATARACT EXTRACTION PHACO AND INTRAOCULAR               LENS PLACEMENT (IOC);  Surgeon: Birder Robson, MD;                Location: [facility] ORS;  Service: Ophthalmology;  Laterality:              Right;  [place] 00:45 AP% 14.4 CDE 6.58 Fluid pack lot #               [id number] 12/14/2018: CATARACT EXTRACTION W/PHACO; Left     Comment:  Procedure: CATARACT EXTRACTION PHACO AND INTRAOCULAR               LENS PLACEMENT (IOC LEFT;  Surgeon: Birder Robson,               MD;  Location: [facility];  Service:               Ophthalmology;  Laterality: Left; 04/30/2017: FLEXIBLE BRONCHOSCOPY; N/A     Comment:  Procedure: FLEXIBLE BRONCHOSCOPY;  Surgeon:               Laverle Hobby, MD;  Location: [facility] ORS;  Service:              Pulmonary;  Laterality: N/A; No date: KIDNEY STONE SURGERY No date: MENISCUS REPAIR; Bilateral  Comment:  right knee x 3, left knee x 2 02/01/2019: SHOULDER ARTHROSCOPY WITH ROTATOR CUFF REPAIR AND  SUBACROMIAL DECOMPRESSION; Right     Comment:  Procedure: Extensive arthroscopic debridement,               arthroscopic subacromial decompression, mini-open rotator              cuff repair supplemented by a [employer]en[employer]               patch, and mini-open biceps tenodesis, right shoulder.;                Surgeon: Corky Mull, MD;  Location: [facility] ORS;                Service: Orthopedics;  Laterality: Right; No date: TOE AMPUTATION; Bilateral     Comment:  index toes  BMI    Body Mass Index: 27.99 kg/m      Reproductive/Obstetrics negative OB ROS                             Anesthesia Physical  Anesthesia Plan  ASA: 3  Anesthesia Plan: General   Post-op Pain Management:    Induction: Intravenous  PONV Risk Score and Plan: Propofol  infusion and TIVA  Airway Management Planned: Natural Airway and Nasal Cannula  Additional Equipment:   Intra-op Plan:   Post-operative Plan:   Informed Consent: I have reviewed the patients History and Physical, chart, labs and discussed the procedure including the risks, benefits and alternatives for the proposed anesthesia with the patient or authorized representative who has indicated his/her understanding and acceptance.     Dental Advisory Given  Plan Discussed with: Anesthesiologist, CRNA and Surgeon  Anesthesia Plan Comments: (Patient has cardiac clearance for this procedure.   Patient consented for risks of anesthesia including but not limited to:  - adverse reactions to medications - damage to teeth, lips or other oral mucosa - sore throat or hoarseness - Damage to heart, brain, lungs or loss of life  Patient voiced understanding.)        Anesthesia Quick Evaluation

## 2021-09-16 NOTE — Op Note (Signed)
[facility] Patient Name: Billy Freeman Procedure Date: 09/16/2021 11:44 AM MRN: [id number] Account #: [id number] Date of Birth: 1950/08/26 Admit Type: Outpatient Age: 71 Room: [facility] ENDO ROOM 2 Gender: Male Note Status: Finalized Instrument Name: Colonoscope [id number] Procedure:             Colonoscopy Indications:           High risk colon cancer surveillance: Personal history                         of colonic polyps Providers:             Annamaria Helling DO, DO Referring MD:          Tracie Harrier, MD (Referring MD) Medicines:             Monitored Anesthesia Care Complications:         No immediate complications. Estimated blood loss:                         Minimal. Procedure:             Pre-Anesthesia Assessment:                        - Prior to the procedure, a History and Physical was                         performed, and patient medications and allergies were                         reviewed. The patient is competent. The risks and                         benefits of the procedure and the sedation options and                         risks were discussed with the patient. All questions                         were answered and informed consent was obtained.                         Patient identification and proposed procedure were                         verified by the physician, the nurse, the anesthetist                         and the technician in the endoscopy suite. Mental                         Status Examination: alert and oriented. Airway                         Examination: normal oropharyngeal airway and neck                         mobility. Respiratory Examination: clear to  auscultation. CV Examination: RRR, no murmurs, no S3                         or S4. Prophylactic Antibiotics: The patient does not                         require prophylactic antibiotics. Prior                          Anticoagulants: The patient has taken no previous                         anticoagulant or antiplatelet agents. ASA Grade                         Assessment: III - A patient with severe systemic                         disease. After reviewing the risks and benefits, the                         patient was deemed in satisfactory condition to                         undergo the procedure. The anesthesia plan was to use                         monitored anesthesia care (MAC). Immediately prior to                         administration of medications, the patient was                         re-assessed for adequacy to receive sedatives. The                         heart rate, respiratory rate, oxygen saturations,                         blood pressure, adequacy of pulmonary ventilation, and                         response to care were monitored throughout the                         procedure. The physical status of the patient was                         re-assessed after the procedure.                        After obtaining informed consent, the colonoscope was                         passed under direct vision. Throughout the procedure,                         the patient's blood pressure, pulse, and oxygen  saturations were monitored continuously. The                         Colonoscope was introduced through the anus and                         advanced to the the cecum, identified by appendiceal                         orifice and ileocecal valve. The colonoscopy was                         technically difficult and complex due to multiple                         diverticula in the colon and inadequate bowel prep.                         Successful completion of the procedure was aided by                         withdrawing the scope and replacing with the pediatric                         colonoscope. The patient tolerated the procedure well.                          The quality of the bowel preparation was evaluated                         using the BBPS [facility] Bowel Preparation Scale) with                         scores of: Right Colon = 2 (minor amount of residual                         staining, small fragments of stool and/or opaque                         liquid, but mucosa seen well), Transverse Colon = 2                         (minor amount of residual staining, small fragments of                         stool and/or opaque liquid, but mucosa seen well) and                         Left Colon = 2 (minor amount of residual staining,                         small fragments of stool and/or opaque liquid, but                         mucosa seen well). The total BBPS score equals 6. The  quality of the bowel preparation was fair. The                         ileocecal valve, appendiceal orifice, and rectum were                         photographed. Findings:      The perianal and digital rectal examinations were normal. Pertinent       negatives include normal sphincter tone.      Multiple small-mouthed diverticula were found in the left colon. created       acute angle at the recto sigmoid requiring change from adult to       pediatric colonoscope. Multiple diverticula, colonic spasm and       suboptimal prep made the left colon difficult to visualize. Estimated       blood loss was minimal.      Non-bleeding internal hemorrhoids were found during retroflexion. The       hemorrhoids were Grade I (internal hemorrhoids that do not prolapse).       Estimated blood loss: none.      Five sessile polyps were found in the descending colon (2), transverse       colon (2) and ascending colon. The polyps were 1 to 2 mm in size. These       polyps were removed with a cold biopsy forceps. Resection and retrieval       were complete. Estimated blood loss was minimal.      Three sessile polyps were found in the transverse (2) colon  and       ascending colon. The polyps were 3 to 5 mm in size. These polyps were       removed with a cold snare. Resection and retrieval were complete.       Estimated blood loss was minimal.      There was moderate spasm in the recto-sigmoid colon, in the sigmoid       colon and in the descending colon. Estimated blood loss: none.      The exam was otherwise without abnormality on direct and retroflexion       views. Impression:            - Preparation of the colon was fair.                        - Diverticulosis in the left colon.                        - Non-bleeding internal hemorrhoids.                        - Five 1 to 2 mm polyps in the descending colon, in                         the transverse colon and in the ascending colon,                         removed with a cold biopsy forceps. Resected and                         retrieved.                        -  Three 3 to 5 mm polyps in the transverse colon and                         in the ascending colon, removed with a cold snare.                         Resected and retrieved.                        - Moderate colonic spasm.                        - The examination was otherwise normal on direct and                         retroflexion views. Recommendation:        - Discharge patient to home.                        - Resume previous diet.                        - No aspirin, ibuprofen, naproxen, or other                         non-steroidal anti-inflammatory drugs for 5 days after                         polyp removal.                        - Resume Eliquis (apixaban) at prior dose in 2 days.                         Refer to referring physician for further adjustment of                         therapy.                        - Repeat colonoscopy in 1 year because the bowel                         preparation was suboptimal, for surveillance based on                         pathology results and for surveillance of  multiple                         polyps.                        - Return to referring physician as previously                         scheduled.                        - The findings and recommendations were discussed with  the patient. Procedure Code(s):     --- Professional ---                        [id number], Colonoscopy, flexible; with removal of                         tumor(s), polyp(s), or other lesion(s) by snare                         technique                        45380, 4, Colonoscopy, flexible; with biopsy, single                         or multiple Diagnosis Code(s):     --- Professional ---                        Z86.010, Personal history of colonic polyps                        K64.0, First degree hemorrhoids                        K58.9, Irritable bowel syndrome without diarrhea                        K63.5, Polyp of colon                        K57.30, Diverticulosis of large intestine without                         perforation or abscess without bleeding CPT copyright 2019 American Medical Association. All rights reserved. The codes documented in this report are preliminary and upon coder review may  be revised to meet current compliance requirements. Attending Participation:      I personally performed the entire procedure. Volney American, DO Annamaria Helling DO, DO 09/16/2021 2:05:38 PM This report has been signed electronically. Number of Addenda: 0 Note Initiated On: 09/16/2021 11:44 AM Scope Withdrawal Time: 0 hours 31 minutes 58 seconds  Total Procedure Duration: 0 hours 58 minutes 53 seconds  Estimated Blood Loss:  Estimated blood loss was minimal.      [facility]

## 2021-09-17 LAB — SURGICAL PATHOLOGY

## 2021-09-30 ENCOUNTER — Other Ambulatory Visit: Payer: Self-pay | Admitting: Specialist

## 2021-09-30 DIAGNOSIS — J432 Centrilobular emphysema: Secondary | ICD-10-CM

## 2021-10-16 ENCOUNTER — Ambulatory Visit
Admission: RE | Admit: 2021-10-16 | Discharge: 2021-10-16 | Disposition: A | Discharge status: Home / Self Care | Payer: Medicare Other | Source: Ambulatory Visit | Attending: Specialist | Admitting: Specialist

## 2021-10-16 ENCOUNTER — Other Ambulatory Visit: Payer: Self-pay

## 2021-10-16 DIAGNOSIS — J432 Centrilobular emphysema: Secondary | ICD-10-CM | POA: Insufficient documentation

## 2021-11-11 ENCOUNTER — Other Ambulatory Visit: Payer: Self-pay | Admitting: Gastroenterology

## 2021-11-11 DIAGNOSIS — K7460 Unspecified cirrhosis of liver: Secondary | ICD-10-CM

## 2021-11-14 ENCOUNTER — Ambulatory Visit
Admission: RE | Admit: 2021-11-14 | Discharge: 2021-11-14 | Disposition: A | Discharge status: Home / Self Care | Payer: Medicare Other | Source: Ambulatory Visit | Attending: Gastroenterology | Admitting: Gastroenterology

## 2021-11-14 DIAGNOSIS — K7460 Unspecified cirrhosis of liver: Secondary | ICD-10-CM | POA: Diagnosis present

## 2021-11-18 DIAGNOSIS — R079 Chest pain, unspecified: Secondary | ICD-10-CM | POA: Insufficient documentation

## 2021-11-18 DIAGNOSIS — R0602 Shortness of breath: Secondary | ICD-10-CM | POA: Insufficient documentation

## 2021-12-21 DIAGNOSIS — S82209A Unspecified fracture of shaft of unspecified tibia, initial encounter for closed fracture: Secondary | ICD-10-CM

## 2021-12-21 HISTORY — DX: Unspecified fracture of shaft of unspecified tibia, initial encounter for closed fracture: S82.209A

## 2021-12-22 DIAGNOSIS — W19XXXA Unspecified fall, initial encounter: Secondary | ICD-10-CM | POA: Insufficient documentation

## 2021-12-31 ENCOUNTER — Ambulatory Visit
Admission: RE | Admit: 2021-12-31 | Discharge: 2021-12-31 | Disposition: A | Discharge status: Home / Self Care | Payer: Medicare Other | Attending: Cardiology | Admitting: Cardiology

## 2021-12-31 ENCOUNTER — Encounter
Admission: RE | Disposition: A | Discharge status: Home / Self Care | Payer: Self-pay | Source: Home / Self Care | Attending: Cardiology

## 2021-12-31 ENCOUNTER — Other Ambulatory Visit: Payer: Self-pay

## 2021-12-31 ENCOUNTER — Encounter: Payer: Self-pay | Admitting: Cardiology

## 2021-12-31 DIAGNOSIS — I272 Pulmonary hypertension, unspecified: Secondary | ICD-10-CM | POA: Insufficient documentation

## 2021-12-31 HISTORY — PX: RIGHT HEART CATH: CATH118263

## 2021-12-31 SURGERY — RIGHT HEART CATH
Anesthesia: Moderate Sedation

## 2021-12-31 MED ORDER — HEPARIN (PORCINE) IN NACL 1000-0.9 UT/500ML-% IV SOLN
INTRAVENOUS | Status: AC
  Filled 2021-12-31: qty 500

## 2021-12-31 MED ORDER — SODIUM CHLORIDE 0.9 % IV SOLN: 250.0000 mL | INTRAVENOUS | Status: DC | PRN

## 2021-12-31 MED ORDER — ACETAMINOPHEN 325 MG PO TABS: 650.0000 mg | ORAL_TABLET | ORAL | Status: DC | PRN

## 2021-12-31 MED ORDER — LIDOCAINE HCL (PF) 1 % IJ SOLN
INTRAMUSCULAR | Status: DC | PRN
  Administered 2021-12-31: 2 mL

## 2021-12-31 MED ORDER — HEPARIN (PORCINE) IN NACL 1000-0.9 UT/500ML-% IV SOLN
INTRAVENOUS | Status: DC | PRN
  Administered 2021-12-31: 500 mL

## 2021-12-31 MED ORDER — HYDRALAZINE HCL 20 MG/ML IJ SOLN: 10.0000 mg | INTRAMUSCULAR | Status: DC | PRN

## 2021-12-31 MED ORDER — MIDAZOLAM HCL 2 MG/2ML IJ SOLN
INTRAMUSCULAR | Status: DC | PRN
  Administered 2021-12-31: 1 mg via INTRAVENOUS

## 2021-12-31 MED ORDER — SODIUM CHLORIDE 0.9% FLUSH: 3.0000 mL | INTRAVENOUS | Status: DC | PRN

## 2021-12-31 MED ORDER — SODIUM CHLORIDE 0.9 % WEIGHT BASED INFUSION: 3.0000 mL/kg/h | INTRAVENOUS | Status: DC

## 2021-12-31 MED ORDER — ASPIRIN 81 MG PO CHEW: 81.0000 mg | CHEWABLE_TABLET | ORAL | Status: AC

## 2021-12-31 MED ORDER — MIDAZOLAM HCL 2 MG/2ML IJ SOLN
INTRAMUSCULAR | Status: AC
  Filled 2021-12-31: qty 2

## 2021-12-31 MED ORDER — ASPIRIN 81 MG PO CHEW
CHEWABLE_TABLET | ORAL | Status: AC
  Administered 2021-12-31: 81 mg via ORAL
  Filled 2021-12-31: qty 1

## 2021-12-31 MED ORDER — SODIUM CHLORIDE 0.9% FLUSH: 3.0000 mL | Freq: Two times a day (BID) | INTRAVENOUS | Status: DC

## 2021-12-31 MED ORDER — SODIUM CHLORIDE 0.9 % WEIGHT BASED INFUSION: 1.0000 mL/kg/h | INTRAVENOUS | Status: DC

## 2021-12-31 MED ORDER — LIDOCAINE HCL 1 % IJ SOLN
INTRAMUSCULAR | Status: AC
  Filled 2021-12-31: qty 20

## 2021-12-31 MED ORDER — ONDANSETRON HCL 4 MG/2ML IJ SOLN: 4.0000 mg | Freq: Four times a day (QID) | INTRAMUSCULAR | Status: DC | PRN

## 2021-12-31 MED ORDER — LABETALOL HCL 5 MG/ML IV SOLN: 10.0000 mg | INTRAVENOUS | Status: DC | PRN

## 2021-12-31 MED ORDER — FENTANYL CITRATE (PF) 100 MCG/2ML IJ SOLN
INTRAMUSCULAR | Status: DC | PRN
Start: 2021-12-31 — End: 2021-12-31
  Administered 2021-12-31: 25 ug via INTRAVENOUS

## 2021-12-31 MED ORDER — FENTANYL CITRATE (PF) 100 MCG/2ML IJ SOLN
INTRAMUSCULAR | Status: AC
  Filled 2021-12-31: qty 2

## 2021-12-31 SURGICAL SUPPLY — 8 items
CATH BALLN WEDGE 5F 110CM (CATHETERS) ×1 IMPLANT
DRAPE BRACHIAL (DRAPES) ×1 IMPLANT
GUIDEWIRE EMER 3M J .025X150CM (WIRE) ×1 IMPLANT
KIT RIGHT HEART ACIST (MISCELLANEOUS) ×1 IMPLANT
PACK CARDIAC CATH (CUSTOM PROCEDURE TRAY) ×2 IMPLANT
PROTECTION STATION PRESSURIZED (MISCELLANEOUS) ×2
SHEATH GLIDE SLENDER 4/5FR (SHEATH) ×1 IMPLANT
STATION PROTECTION PRESSURIZED (MISCELLANEOUS) IMPLANT

## 2022-01-09 DIAGNOSIS — I272 Pulmonary hypertension, unspecified: Secondary | ICD-10-CM | POA: Insufficient documentation

## 2022-02-02 NOTE — Progress Notes (Signed)
[facility] [place], [place] [address]  Pulmonary Sleep Medicine   Office Visit Note  Patient Name: Billy Freeman. DOB: 11-21-1950 MRN [id number]    Chief Complaint: Obstructive Sleep Apnea visit  Brief History:  Billy Freeman is seen today for an annual follow up visit for Auto  ASV @ EPAP  5, PS min 3 max 15. The patient has a 17 year history of sleep apnea. Patient is using PAP nightly.  The patient feels rested after sleeping with PAP.  The patient reports benefit  from PAP use. Reported sleepiness is  improved and the Epworth Sleepiness Score is 5 out of 24. The patient does rarely take naps. The patient complains of the following: recently he saw that his AHI went above 3. He had a fall recently and has had some restless sleep due to knee pain.  The compliance download shows  99% compliance with an average use time of 8.5 hours. The AHI is 1.3  The patient does not complain of limb movements disrupting sleep.  ROS  General: (-) fever, (-) chills, (-) night sweat Nose and Sinuses: (-) nasal stuffiness or itchiness, (-) postnasal drip, (-) nosebleeds, (-) sinus trouble. Mouth and Throat: (-) sore throat, (-) hoarseness. Neck: (-) swollen glands, (-) enlarged thyroid, (-) neck pain. Respiratory: + cough, + shortness of breath, + wheezing. Neurologic: - numbness, - tingling. Psychiatric: + anxiety, + depression   Current Medication: Outpatient Encounter Medications as of 02/03/2022  Medication Sig Note   allopurinol (ZYLOPRIM) 100 MG tablet Take 100 mg by mouth every morning.     allopurinol (ZYLOPRIM) 100 MG tablet Take 1 tablet by mouth daily.    apixaban (ELIQUIS) 5 MG TABS tablet Take 1 tablet (5 mg total) by mouth 2 (two) times daily. Take 10 mg (two pills) twice daily for seven days, then take 60m (one pill) twice daily. (Patient taking differently: Take 5 mg by mouth 2 (two) times daily.)    apixaban (ELIQUIS) 5 MG TABS tablet Take by mouth.     budesonide-formoterol (SYMBICORT) 160-4.5 MCG/ACT inhaler Inhale 2 puffs into the lungs 2 (two) times daily.    Cholecalciferol (VITAMIN D) 50 MCG (2000 UT) tablet Take 4,000 Units by mouth daily.    citalopram (CELEXA) 20 MG tablet Take 20 mg by mouth every morning.     clindamycin (CLEOCIN T) 1 % external solution Apply 1 application topically 2 (two) times daily as needed (for breakout).     diazepam (VALIUM) 5 MG tablet Take 1 tablet (5 mg total) by mouth every 12 (twelve) hours as needed for muscle spasms.    Omega-3 Fatty Acids (FISH OIL) 1200 MG CAPS Take 1,200 mg by mouth 2 (two) times daily.     propranolol (INDERAL) 10 MG tablet Take 1 tablet by mouth 2 (two) times daily.    tadalafil (CIALIS) 5 MG tablet Take 10 mg by mouth daily as needed for erectile dysfunction.  12/31/2021: Takes PRN   temazepam (RESTORIL) 15 MG capsule Take 15 mg by mouth at bedtime.     temazepam (RESTORIL) 15 MG capsule Take by mouth.    vitamin B-12 (CYANOCOBALAMIN) 500 MCG tablet Take 500 mcg by mouth daily.    No facility-administered encounter medications on file as of 02/03/2022.    Surgical History: Past Surgical History:  Procedure Laterality Date   ANTERIOR CRUCIATE LIGAMENT (ACL) REVISION     broken nose surgery     BRONCHOSCOPY     BUNIONECTOMY Bilateral  CATARACT EXTRACTION W/PHACO Right 02/09/2018   Procedure: CATARACT EXTRACTION PHACO AND INTRAOCULAR LENS PLACEMENT (IOC);  Surgeon: Birder Robson, MD;  Location: [facility] ORS;  Service: Ophthalmology;  Laterality: Right;  [place] 00:45 AP% 14.4 CDE 6.58 Fluid pack lot # [id number]   CATARACT EXTRACTION W/PHACO Left 12/14/2018   Procedure: CATARACT EXTRACTION PHACO AND INTRAOCULAR LENS PLACEMENT (IOC LEFT;  Surgeon: Birder Robson, MD;  Location: [place];  Service: Ophthalmology;  Laterality: Left;   CHOLECYSTECTOMY     COLONOSCOPY N/A 09/16/2021   Procedure: COLONOSCOPY;  Surgeon: Annamaria Helling, DO;  Location: [facility] ENDOSCOPY;   Service: Gastroenterology;  Laterality: N/A;   ESOPHAGOGASTRODUODENOSCOPY N/A 09/16/2021   Procedure: ESOPHAGOGASTRODUODENOSCOPY (EGD);  Surgeon: Annamaria Helling, DO;  Location: [facility] ENDOSCOPY;  Service: Gastroenterology;  Laterality: N/A;   EYE SURGERY     FLEXIBLE BRONCHOSCOPY N/A 04/30/2017   Procedure: FLEXIBLE BRONCHOSCOPY;  Surgeon: Laverle Hobby, MD;  Location: [facility] ORS;  Service: Pulmonary;  Laterality: N/A;   KIDNEY STONE SURGERY     LIVER BIOPSY     MENISCUS REPAIR Bilateral    right knee x 3, left knee x 2   RIGHT HEART CATH N/A 12/31/2021   Procedure: RIGHT HEART CATH;  Surgeon: Isaias Cowman, MD;  Location: [place] CV LAB;  Service: Cardiovascular;  Laterality: N/A;   SHOULDER ARTHROSCOPY WITH ROTATOR CUFF REPAIR AND SUBACROMIAL DECOMPRESSION Right 02/01/2019   Procedure: Extensive arthroscopic debridement, arthroscopic subacromial decompression, mini-open rotator cuff repair supplemented by a [employer] Regeneten patch, and mini-open biceps tenodesis, right shoulder.;  Surgeon: Corky Mull, MD;  Location: [facility] ORS;  Service: Orthopedics;  Laterality: Right;   TOE AMPUTATION Bilateral    index toes    Medical History: Past Medical History:  Diagnosis Date   Atrial fibrillation (HCC)    Basal cell carcinoma    Cancer (HCC)    SKIN   Cirrhosis (HCC)    POSSIBLE   COPD (chronic obstructive pulmonary disease) (HCC)    Depression    Diverticulosis    Dysrhythmia    a-fib   Edema    right FEET/LEG   Fatty liver    GERD (gastroesophageal reflux disease)    h/o   Glaucoma (increased eye pressure)    Gout    Heart murmur    History of kidney stones    HOH (hard of hearing)    Hypertension    Pneumonia    January/February 2020   Pulmonary embolism (HCC)    Pulmonary nodules    Reflux    Rosacea    Sleep apnea    cpap   Tibial fracture 12/21/2021   Tremors of nervous system    Wears hearing aid     Family History: Non  contributory to the present illness  Social History: Social History   Socioeconomic History   Marital status: Married    Spouse name: Not on file   Number of children: Not on file   Years of education: Not on file   Highest education level: Not on file  Occupational History   Not on file  Tobacco Use   Smoking status: Never   Smokeless tobacco: Never  Vaping Use   Vaping Use: Never used  Substance and Sexual Activity   Alcohol use: Not Currently    Alcohol/week: 5.0 standard drinks of alcohol    Types: 5 Glasses of wine per week   Drug use: Not Currently    Types: Marijuana    Comment: Friday 09/13/20  Edibles   Sexual activity: Not on file  Other Topics Concern   Not on file  Social History Narrative   Not on file   Social Determinants of Health   Financial Resource Strain: Not on file  Food Insecurity: Not on file  Transportation Needs: Not on file  Physical Activity: Not on file  Stress: Not on file  Social Connections: Not on file  Intimate Partner Violence: Not on file    Vital Signs: Blood pressure 117/67, pulse (!) 58, resp. rate 20, height _0  (1.93 m), weight 224 lb 4.8 oz (101.7 kg), SpO2 95 %. Body mass index is 27.3 kg/m.    Examination: General Appearance: The patient is well-developed, well-nourished, and in no distress. Neck Circumference: 42cm Skin: Gross inspection of skin unremarkable. Head: normocephalic, no gross deformities. Eyes: no gross deformities noted. ENT: ears appear grossly normal Neurologic: Alert and oriented. No involuntary movements.    EPWORTH SLEEPINESS SCALE:  Scale:  (0)= no chance of dozing; (1)= slight chance of dozing; (2)= moderate chance of dozing; (3)= high chance of dozing  Chance  Situtation    Sitting and reading: 1    Watching TV: 1    Sitting Inactive in public: 0    As a passenger in car: 1      Lying down to rest: 2    Sitting and talking: 0    Sitting quielty after lunch: 0    In a car,  stopped in traffic: 0   TOTAL SCORE:   5 out of 24    SLEEP STUDIES:   PSG (12/2004) AHI 17/hr, Supine AHI 22/hr, REM AHI 17/hr, min SpO2 84% Titration (05/2018) needs ASV titration   CPAP COMPLIANCE DATA:  Date Range: 01/31/21-01/30/22  Average Daily Use: 8.5 hours  Median Use: 8.5 hrs  Compliance for > 4 Hours: 99% days  AHI: 1.3 respiratory events per hour  Days Used: 363/365  Mask Leak: 20.7  95th Percentile Pressure: EPAP 5, Min PS 3, Max PS 15         LABS: No results found for this or any previous visit (from the past 2160 hour(s)).  Radiology: CARDIAC CATHETERIZATION  Result Date: 12/31/2021   Hemodynamic findings consistent with mild pulmonary hypertension. 1.  Mild pulmonary hypertension with mean pulmonary arterial pressure 27 mmHg, pulmonary capillary wedge 11 mmHg    No results found.  No results found.    Assessment and Plan: Patient Active Problem List   Diagnosis Date Noted   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   Pulmonary hypertension, mild ([place]) 01/09/2022   OSA treated with BiPAP 12/31/2020   Hypertension 12/31/2020   Overweight (BMI 25.0-29.9) 12/31/2020   Actinic keratosis 01/03/2019   Decreased left ventricular function 01/03/2019   Senile hyperkeratosis 01/03/2019   Atrial fibrillation ([place]) 10/05/2018   Bilateral lower extremity edema 01/19/2018   Blue toes 01/19/2018   History of pulmonary embolism 07/14/2017   Personal history of gout 07/14/2017   Closed compression fracture of L1 vertebra ([place]) 07/15/2016   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   History of urinary stone 02/26/2015   Erectile dysfunction 06/07/2014   Hyperlipidemia 06/06/2014   CAFL (chronic airflow limitation) ([place]) 01/02/2014   Clinical depression 01/02/2014   Acid reflux 01/02/2014   Gout 01/02/2014   BP (high blood pressure) 01/02/2014   Intermittent tremor 01/02/2014   Obstructive apnea 01/02/2014   Acne erythematosa 01/02/2014   Abnormal  involuntary movement 01/02/2014   Major depressive disorder, single episode 01/02/2014   Calculus  of kidney 11/23/2013   Hydronephrosis 58/30/9407   Renal colic 68/02/8109   Calculi, ureter 11/23/2013    1. Complex sleep apnea syndrome The patient does tolerate PAP and reports  benefit from PAP use. The patient was reminded how to clean equipment and advised to replace supplies routinely. The patient was also counselled on weight loss. The compliance is excellent. The AHI is 1.3. Bipap  continues to be medically necessary to treat this patient's complex apnea. F/u one year.    2. Chronic atrial fibrillation (HCC) Pt understands importance of treating apnea in light of same.   3. Hypertension, unspecified type Hypertension Counseling:   The following hypertensive lifestyle modification were recommended and discussed:  1. Limiting alcohol intake to less than 1 oz/day of ethanol:(24 oz of beer or 8 oz of wine or 2 oz of 100-proof whiskey). 2. Take baby ASA 81 mg daily. 3. Importance of regular aerobic exercise and losing weight. 4. Reduce dietary saturated fat and cholesterol intake for overall cardiovascular health. 5. Maintaining adequate dietary potassium, calcium, and magnesium intake. 6. Regular monitoring of the blood pressure. 7. Reduce sodium intake to less than 100 mmol/day (less than 2.3 gm of sodium or less than 6 gm of sodium choride)    4. Pulmonary hypertension, mild ([place]) Undergoing evaluation at [facility].     General Counseling: I have discussed the findings of the evaluation and examination with Billy Freeman.  I have also discussed any further diagnostic evaluation thatmay be needed or ordered today. Billy Freeman verbalizes understanding of the findings of todays visit. We also reviewed his medications today and discussed drug interactions and side effects including but not limited excessive drowsiness and altered mental states. We also discussed that there is always a risk not just to  him but also people around him. he has been encouraged to call the office with any questions or concerns that should arise related to todays visit.  No orders of the defined types were placed in this encounter.       I have personally obtained a history, examined the patient, evaluated laboratory and imaging results, formulated the assessment and plan and placed orders. This patient was seen today by Tressie Ellis, PA-C in collaboration with Dr. Devona Konig.   Allyne Gee, MD [facility] Diplomate ABMS Pulmonary Critical Care Medicine and Sleep Medicine

## 2022-02-03 ENCOUNTER — Ambulatory Visit (INDEPENDENT_AMBULATORY_CARE_PROVIDER_SITE_OTHER): Payer: Medicare Other | Admitting: Internal Medicine

## 2022-02-03 VITALS — BP 117/67 | HR 58 | Resp 20 | Ht 76.0 in | Wt 224.3 lb

## 2022-02-03 DIAGNOSIS — G4731 Primary central sleep apnea: Secondary | ICD-10-CM | POA: Diagnosis not present

## 2022-02-03 DIAGNOSIS — I482 Chronic atrial fibrillation, unspecified: Secondary | ICD-10-CM

## 2022-02-03 DIAGNOSIS — I272 Pulmonary hypertension, unspecified: Secondary | ICD-10-CM | POA: Diagnosis not present

## 2022-02-03 DIAGNOSIS — I10 Essential (primary) hypertension: Secondary | ICD-10-CM

## 2022-02-03 NOTE — Patient Instructions (Signed)

## 2022-04-21 ENCOUNTER — Other Ambulatory Visit: Payer: Self-pay

## 2022-04-21 ENCOUNTER — Emergency Department: Payer: Medicare Other

## 2022-04-21 ENCOUNTER — Inpatient Hospital Stay
Admission: EM | Admit: 2022-04-21 | Discharge: 2022-04-24 | DRG: 378 | Disposition: A | Discharge status: Home / Self Care | Payer: Medicare Other | Attending: Family Medicine | Admitting: Family Medicine

## 2022-04-21 DIAGNOSIS — K922 Gastrointestinal hemorrhage, unspecified: Principal | ICD-10-CM | POA: Diagnosis present

## 2022-04-21 DIAGNOSIS — Z974 Presence of external hearing-aid: Secondary | ICD-10-CM

## 2022-04-21 DIAGNOSIS — K921 Melena: Secondary | ICD-10-CM | POA: Diagnosis present

## 2022-04-21 DIAGNOSIS — Z885 Allergy status to narcotic agent status: Secondary | ICD-10-CM

## 2022-04-21 DIAGNOSIS — Z89422 Acquired absence of other left toe(s): Secondary | ICD-10-CM

## 2022-04-21 DIAGNOSIS — Z89421 Acquired absence of other right toe(s): Secondary | ICD-10-CM

## 2022-04-21 DIAGNOSIS — G4733 Obstructive sleep apnea (adult) (pediatric): Secondary | ICD-10-CM | POA: Diagnosis present

## 2022-04-21 DIAGNOSIS — Z961 Presence of intraocular lens: Secondary | ICD-10-CM | POA: Diagnosis present

## 2022-04-21 DIAGNOSIS — Z9049 Acquired absence of other specified parts of digestive tract: Secondary | ICD-10-CM

## 2022-04-21 DIAGNOSIS — I10 Essential (primary) hypertension: Secondary | ICD-10-CM | POA: Diagnosis present

## 2022-04-21 DIAGNOSIS — K760 Fatty (change of) liver, not elsewhere classified: Secondary | ICD-10-CM | POA: Diagnosis present

## 2022-04-21 DIAGNOSIS — Z86711 Personal history of pulmonary embolism: Secondary | ICD-10-CM

## 2022-04-21 DIAGNOSIS — F32A Depression, unspecified: Secondary | ICD-10-CM | POA: Diagnosis present

## 2022-04-21 DIAGNOSIS — Z79899 Other long term (current) drug therapy: Secondary | ICD-10-CM

## 2022-04-21 DIAGNOSIS — Z7951 Long term (current) use of inhaled steroids: Secondary | ICD-10-CM

## 2022-04-21 DIAGNOSIS — Z7901 Long term (current) use of anticoagulants: Secondary | ICD-10-CM

## 2022-04-21 DIAGNOSIS — Z87442 Personal history of urinary calculi: Secondary | ICD-10-CM

## 2022-04-21 DIAGNOSIS — I4819 Other persistent atrial fibrillation: Secondary | ICD-10-CM | POA: Diagnosis present

## 2022-04-21 DIAGNOSIS — Z85828 Personal history of other malignant neoplasm of skin: Secondary | ICD-10-CM

## 2022-04-21 DIAGNOSIS — K5731 Diverticulosis of large intestine without perforation or abscess with bleeding: Secondary | ICD-10-CM | POA: Diagnosis not present

## 2022-04-21 DIAGNOSIS — J449 Chronic obstructive pulmonary disease, unspecified: Secondary | ICD-10-CM

## 2022-04-21 DIAGNOSIS — Z9842 Cataract extraction status, left eye: Secondary | ICD-10-CM

## 2022-04-21 DIAGNOSIS — I4891 Unspecified atrial fibrillation: Secondary | ICD-10-CM | POA: Diagnosis present

## 2022-04-21 DIAGNOSIS — I700 Atherosclerosis of aorta: Secondary | ICD-10-CM | POA: Diagnosis present

## 2022-04-21 DIAGNOSIS — J439 Emphysema, unspecified: Secondary | ICD-10-CM | POA: Diagnosis present

## 2022-04-21 DIAGNOSIS — I272 Pulmonary hypertension, unspecified: Secondary | ICD-10-CM | POA: Diagnosis present

## 2022-04-21 DIAGNOSIS — Z9841 Cataract extraction status, right eye: Secondary | ICD-10-CM

## 2022-04-21 LAB — COMPREHENSIVE METABOLIC PANEL
ALT: 34 U/L (ref 0–44)
AST: 38 U/L (ref 15–41)
Albumin: 3.7 g/dL (ref 3.5–5.0)
Alkaline Phosphatase: 112 U/L (ref 38–126)
Anion gap: 7 (ref 5–15)
BUN: 26 mg/dL — ABNORMAL HIGH (ref 8–23)
CO2: 24 mmol/L (ref 22–32)
Calcium: 9 mg/dL (ref 8.9–10.3)
Chloride: 107 mmol/L (ref 98–111)
Creatinine, Ser: 1 mg/dL (ref 0.61–1.24)
GFR, Estimated: >60 mL/min (ref >60)
Glucose, Bld: 102 mg/dL — ABNORMAL HIGH (ref 70–99)
Potassium: 4.3 mmol/L (ref 3.5–5.1)
Sodium: 138 mmol/L (ref 135–145)
Total Bilirubin: 1.9 mg/dL — ABNORMAL HIGH (ref 0.3–1.2)
Total Protein: 6.9 g/dL (ref 6.5–8.1)

## 2022-04-21 LAB — CBC
HCT: 48.6 % (ref 39.0–52.0)
Hemoglobin: 16.4 g/dL (ref 13.0–17.0)
MCH: 33.1 pg (ref 26.0–34.0)
MCHC: 33.7 g/dL (ref 30.0–36.0)
MCV: 98 fL (ref 80.0–100.0)
Platelets: 213 10*3/uL (ref 150–400)
RBC: 4.96 MIL/uL (ref 4.22–5.81)
RDW: 15 % (ref 11.5–15.5)
WBC: 8.4 10*3/uL (ref 4.0–10.5)
nRBC: 0 % (ref 0.0–0.2)

## 2022-04-21 LAB — PROTIME-INR
INR: 1.3 — ABNORMAL HIGH (ref 0.8–1.2)
Prothrombin Time: 15.9 seconds — ABNORMAL HIGH (ref 11.4–15.2)

## 2022-04-21 LAB — HEMOGLOBIN AND HEMATOCRIT, BLOOD
HCT: 46.8 % (ref 39.0–52.0)
Hemoglobin: 15.9 g/dL (ref 13.0–17.0)

## 2022-04-21 LAB — TYPE AND SCREEN
ABO/RH(D): O POS
Antibody Screen: NEGATIVE

## 2022-04-21 LAB — APTT: aPTT: 34 seconds (ref 24–36)

## 2022-04-21 MED ORDER — ACETAMINOPHEN 325 MG PO TABS: 650.0000 mg | ORAL_TABLET | Freq: Four times a day (QID) | ORAL | Status: DC | PRN

## 2022-04-21 MED ORDER — ONDANSETRON HCL 4 MG PO TABS: 4.0000 mg | ORAL_TABLET | Freq: Four times a day (QID) | ORAL | Status: DC | PRN

## 2022-04-21 MED ORDER — MORPHINE SULFATE (PF) 2 MG/ML IV SOLN: 2.0000 mg | INTRAVENOUS | Status: DC | PRN

## 2022-04-21 MED ORDER — CITALOPRAM HYDROBROMIDE 20 MG PO TABS
20.0000 mg | ORAL_TABLET | ORAL | Status: DC
  Administered 2022-04-22 – 2022-04-24 (×3): 20 mg via ORAL
  Filled 2022-04-21 (×3): qty 1

## 2022-04-21 MED ORDER — HYDROCODONE-ACETAMINOPHEN 5-325 MG PO TABS: 1.0000 | ORAL_TABLET | ORAL | Status: DC | PRN

## 2022-04-21 MED ORDER — ONDANSETRON HCL 4 MG/2ML IJ SOLN: 4.0000 mg | Freq: Four times a day (QID) | INTRAMUSCULAR | Status: DC | PRN

## 2022-04-21 MED ORDER — TEMAZEPAM 7.5 MG PO CAPS
15.0000 mg | ORAL_CAPSULE | Freq: Every day | ORAL | Status: DC
  Administered 2022-04-21 – 2022-04-23 (×3): 15 mg via ORAL
  Filled 2022-04-21: qty 1
  Filled 2022-04-21 (×2): qty 2

## 2022-04-21 MED ORDER — PANTOPRAZOLE SODIUM 40 MG IV SOLR
40.0000 mg | Freq: Once | INTRAVENOUS | Status: AC
  Administered 2022-04-21: 40 mg via INTRAVENOUS
  Filled 2022-04-21: qty 10

## 2022-04-21 MED ORDER — ACETAMINOPHEN 650 MG RE SUPP: 650.0000 mg | Freq: Four times a day (QID) | RECTAL | Status: DC | PRN

## 2022-04-21 MED ORDER — PROPRANOLOL HCL 10 MG PO TABS
10.0000 mg | ORAL_TABLET | Freq: Two times a day (BID) | ORAL | Status: DC
  Administered 2022-04-21 – 2022-04-24 (×6): 10 mg via ORAL
  Filled 2022-04-21 (×6): qty 1

## 2022-04-21 MED ORDER — SODIUM CHLORIDE 0.9 % IV SOLN: INTRAVENOUS | Status: AC

## 2022-04-21 MED ORDER — IOHEXOL 350 MG/ML SOLN
100.0000 mL | Freq: Once | INTRAVENOUS | Status: AC | PRN
  Administered 2022-04-21: 100 mL via INTRAVENOUS

## 2022-04-21 MED ORDER — LACTATED RINGERS IV BOLUS
1000.0000 mL | Freq: Once | INTRAVENOUS | Status: AC
  Administered 2022-04-21: 1000 mL via INTRAVENOUS

## 2022-04-21 NOTE — Assessment & Plan Note (Signed)
Blood pressure controlled.  Continue propranolol

## 2022-04-21 NOTE — Assessment & Plan Note (Signed)
Nighttime BiPAP

## 2022-04-21 NOTE — Assessment & Plan Note (Signed)
Not acutely exacerbated Continue home inhalers DuoNebs as needed 

## 2022-04-21 NOTE — ED Provider Notes (Signed)
[facility] Provider Note    Event Date/Time   First MD Initiated Contact with Patient 04/21/22 1503     (approximate)   History   Chief Complaint Rectal Bleeding   HPI  Billy Freeman. is a 71 y.o. male with past medical history of hypertension, COPD, atrial fibrillation on Eliquis, and PE who presents to the ED complaining of rectal bleeding.  Patient reports that earlier this morning he had a loose bowel movement that he thought was diarrhea, but noticed a significant amount of bright red blood when he went to wipe.  He has had multiple large bloody bowel movements since then, reports having his sixth bloody bowel movement just after being placed in a room.  He reports feeling "queasy", but denies any pain in his abdomen or vomiting.  He does take Eliquis regularly for atrial fibrillation with his last dose coming earlier this morning.  He has never had similar symptoms in the past.     Physical Exam   Triage Vital Signs: ED Triage Vitals  Enc Vitals Group     BP 04/21/22 1123 (!) 125/93     Pulse Rate 04/21/22 1123 68     Resp 04/21/22 1123 16     Temp 04/21/22 1123 98.3 F (36.8 C)     Temp Source 04/21/22 1451 Oral     SpO2 04/21/22 1123 95 %     Weight 04/21/22 1124 218 lb (98.9 kg)     Height 04/21/22 1124 '6\' 4"'$  (1.93 m)     Head Circumference --      Peak Flow --      Pain Score 04/21/22 1124 0     Pain Loc --      Pain Edu? --      Excl. in [place]? --     Most recent vital signs: Vitals:   04/21/22 1451 04/21/22 1806  BP: 127/77 120/70  Pulse: 63 68  Resp: 18 18  Temp: (!) 97.4 F (36.3 C) 98 F (36.7 C)  SpO2: 100% 100%    Constitutional: Alert and oriented. Eyes: Conjunctivae are normal. Head: Atraumatic. Nose: No congestion/rhinnorhea. Mouth/Throat: Mucous membranes are moist.  Cardiovascular: Normal rate, regular rhythm. Grossly normal heart sounds.  2+ radial pulses bilaterally. Respiratory: Normal respiratory effort.   No retractions. Lungs CTAB.  Rectal exam with significant amount of dark blood.  No external hemorrhoids noted. Gastrointestinal: Soft and nontender. No distention. Musculoskeletal: No lower extremity tenderness nor edema.  Neurologic:  Normal speech and language. No gross focal neurologic deficits are appreciated.    ED Results / Procedures / Treatments   Labs (all labs ordered are listed, but only abnormal results are displayed) Labs Reviewed  COMPREHENSIVE METABOLIC PANEL - Abnormal; Notable for the following components:      Result Value   Glucose, Bld 102 (*)    BUN 26 (*)    Total Bilirubin 1.9 (*)    All other components within normal limits  PROTIME-INR - Abnormal; Notable for the following components:   Prothrombin Time 15.9 (*)    INR 1.3 (*)    All other components within normal limits  CBC  HEMOGLOBIN AND HEMATOCRIT, BLOOD  APTT  POC OCCULT BLOOD, ED  TYPE AND SCREEN   RADIOLOGY CTA of abdomen/pelvis reviewed and interpreted by me with no active extravasation noted.  PROCEDURES:  Critical Care performed: No  Procedures   MEDICATIONS ORDERED IN ED: Medications  lactated ringers bolus 1,000 mL (0 mLs  Intravenous Stopped 04/21/22 1806)  pantoprazole (PROTONIX) injection 40 mg (40 mg Intravenous Given 04/21/22 1626)  iohexol (OMNIPAQUE) 350 MG/ML injection 100 mL (100 mLs Intravenous Contrast Given 04/21/22 1640)     IMPRESSION / MDM / ASSESSMENT AND PLAN / ED COURSE  I reviewed the triage vital signs and the nursing notes.                              72 y.o. male with past medical history of hypertension, COPD, atrial fibrillation on Eliquis, and PE who presents to the ED complaining of multiple bloody bowel movements since waking up this morning.  Patient's presentation is most consistent with acute presentation with potential threat to life or bodily function.  Differential diagnosis includes, but is not limited to, lower GI bleed, diverticular bleed,  AVM, upper GI bleed, bleeding hemorrhoid, anemia.  Patient nontoxic-appearing and in no acute distress, vital signs are unremarkable and he has a benign abdominal exam.  Rectal exam does show significant amount of dark blood and patient had his seventh bloody bowel movement just after my evaluation.  We will further assess with CTA for potential or active extravasation.  Initial hemoglobin is reassuring and we will trend, hold off on blood transfusion or reversal of his anticoagulation given he remains hemodynamically stable.  Remainder of labs are reassuring with no significant electrolyte abnormality or AKI, LFTs are unremarkable.  CTA is reassuring with no active extravasation, H&H slightly downtrending on recheck although patient continues to pass bloody stool here in the ED.  Given he is anticoagulated, case discussed with hospitalist for admission.      FINAL CLINICAL IMPRESSION(S) / ED DIAGNOSES   Final diagnoses:  Lower GI bleed     Rx / DC Orders   ED Discharge Orders     None        Note:  This document was prepared using Dragon voice recognition software and may include unintentional dictation errors.   Blake Divine, MD 04/21/22 [phone]

## 2022-04-21 NOTE — H&P (Signed)
History and Physical    Patient: Billy Freeman. ZOX:[id number] DOB: 1951/02/26 DOA: 04/21/2022 DOS: the patient was seen and examined on 04/21/2022 PCP: Tracie Harrier, MD  Patient coming from: Home  Chief Complaint:  Chief Complaint  Patient presents with   Rectal Bleeding    HPI: Amaro Mangold. is a 71 y.o. male with medical history significant for COPD, HTN, OSA on BiPAP, pulmonary hypertension, past history of PE, diagnosed with atrial fibrillation in March 2023 when he was started on Eliquis, who presents to the ED for evaluation of rectal bleeding.  Patient reports that he felt like he was having diarrhea and when he wiped he saw blood.  He went on to have several more episodes, up to 6, with the last one being blood without stool.  He has associated abdominal cramping associated with the bowel movements but denies pain.  He denies nausea or vomiting.  Denies prior episodes. Chart review reveals last colonoscopy in February 2023 that was positive for multiple diverticula and nonbleeding grade 1 internal hemorrhoids.  Upper endoscopy at the same time showed duodenitis and gastritis. ED course and data review: Vitals within normal limits.  Hemoglobin 16.4-15.9 after 5 hours.  INR 1.3, CMP with total bili 1.9 otherwise unremarkable.  EKG, personally viewed and interpreted with A-fib at 64 with no acute ST-T wave changes. 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  Patient was treated with IV Protonix given a 2 L LR bolus and hospitalist consulted for admission.   Review of Systems: As mentioned in the history of present illness. All other systems reviewed and are negative.  Past Medical History:  Diagnosis Date   Atrial fibrillation ([place])    Basal cell carcinoma    Cancer (HCC)    SKIN   Cirrhosis ([place])    POSSIBLE   COPD (chronic obstructive pulmonary disease) (HCC)     Depression    Diverticulosis    Dysrhythmia    a-fib   Edema    right FEET/LEG   Fatty liver    GERD (gastroesophageal reflux disease)    h/o   Glaucoma (increased eye pressure)    Gout    Heart murmur    History of kidney stones    HOH (hard of hearing)    Hypertension    Pneumonia    January/February 2020   Pulmonary embolism ([place])    Pulmonary nodules    Reflux    Rosacea    Sleep apnea    cpap   Tibial fracture 12/21/2021   Tremors of nervous system    Wears hearing aid    Past Surgical History:  Procedure Laterality Date   ANTERIOR CRUCIATE LIGAMENT (ACL) REVISION     broken nose surgery     BRONCHOSCOPY     BUNIONECTOMY Bilateral    CATARACT EXTRACTION W/PHACO Right 02/09/2018   Procedure: CATARACT EXTRACTION PHACO AND INTRAOCULAR LENS PLACEMENT ([place]);  Surgeon: Birder Robson, MD;  Location: [facility];  Service: Ophthalmology;  Laterality: Right;  [place] 00:45 AP% 14.4 CDE 6.58 Fluid pack lot # [id number]   CATARACT EXTRACTION W/PHACO Left 12/14/2018   Procedure: CATARACT EXTRACTION PHACO AND INTRAOCULAR LENS PLACEMENT (IOC LEFT;  Surgeon: Birder Robson, MD;  Location: [place];  Service: Ophthalmology;  Laterality: Left;   CHOLECYSTECTOMY     COLONOSCOPY N/A 09/16/2021   Procedure: COLONOSCOPY;  Surgeon: Annamaria Helling,  DO;  Location: [facility] END[facility]COPY;  Service: Gastroenterology;  Laterality: N/A;   ESOPHAGOGASTRODUODENOSCOPY N/A 09/16/2021   Procedure: ESOPHAGOGASTRODUODENOSCOPY (EGD);  Surgeon: Annamaria Helling, DO;  Location: [facility] ENDOSCOPY;  Service: Gastroenterology;  Laterality: N/A;   EYE SURGERY     FLEXIBLE BRONCHOSCOPY N/A 04/30/2017   Procedure: FLEXIBLE BRONCHOSCOPY;  Surgeon: Laverle Hobby, MD;  Location: [facility] OR[facility];  Service: Pulmonary;  Laterality: N/A;   KIDNEY STONE SURGERY     LIVER BIOPSY     MENISCUS REPAIR Bilateral    right knee x 3, left knee x 2   RIGHT HEART CATH N/A 12/31/2021   Procedure: RIGHT HEART  CATH;  Surgeon: Isaias Cowman, MD;  Location: [place] [facility];  Service: Cardiovascular;  Laterality: N/A;   SHOULDER ARTHROSCOPY WITH ROTATOR CUFF REPAIR AND SUBACROMIAL DECOMPRESSION Right 02/01/2019   Procedure: Extensive arthroscopic debridement, arthroscopic subacromial decompression, mini-open rotator cuff repair supplemented by a [employer] Regeneten patch, and mini-open biceps tenodesis, right shoulder.;  Surgeon: Corky Mull, MD;  Location: [facility] ORS;  Service: Orthopedics;  Laterality: Right;   TOE AMPUTATION Bilateral    index toes   Social History:  reports that he has never smoked. He has never used smokeless tobacco. He reports that he does not currently use alcohol after a past usage of about 5.0 standard drinks of alcohol per week. He reports that he does not currently use drugs after having used the following drugs: Marijuana.  Allergies  Allergen Reactions   Oxycodone Itching    History reviewed. No pertinent family history.  Prior to Admission medications   Medication Sig Start Date End Date Taking? Authorizing Provider  allopurinol (ZYLOPRIM) 100 MG tablet Take 1 tablet by mouth daily. 10/24/20  Yes [provider]  apixaban (ELIQUIS) 5 MG TABS tablet Take 1 tablet (5 mg total) by mouth 2 (two) times daily. Take 10 mg (two pills) twice daily for seven days, then take 28m (one pill) twice daily. Patient taking differently: Take 5 mg by mouth 2 (two) times daily. 09/19/16  Yes RMerlyn Lot MD  azithromycin (ZITHROMAX) 250 MG tablet Take 250 mg by mouth daily.   Yes [provider]  budesonide-formoterol (SYMBICORT) 160-4.5 MCG/ACT inhaler Inhale 2 puffs into the lungs 2 (two) times daily.   Yes [provider]  citalopram (CELEXA) 20 MG tablet Take 20 mg by mouth every morning.  03/28/14  Yes [provider]  Omega-3 Fatty Acids (FISH OIL) 1200 MG CAPS Take 1,200 mg by mouth 2 (two) times daily.    Yes [provider]  propranolol (INDERAL) 10 MG tablet Take 1 tablet by mouth 2 (two) times daily. 11/05/20  Yes [provider]  temazepam (RESTORIL) 15 MG capsule Take 15 mg by mouth at bedtime.  03/19/14  Yes [provider]  vitamin B-12 (CYANOCOBALAMIN) 500 MCG tablet Take 500 mcg by mouth daily.   Yes [provider]  Acetaminophen Extra Strength 500 MG TABS Take 2 tablets by mouth every 8 (eight) hours as needed. 12/24/21   [provider]  allopurinol (ZYLOPRIM) 100 MG tablet Take 100 mg by mouth every morning.  12/15/17 10/05/19  [provider]  celecoxib (CELEBREX) 200 MG capsule Take 200 mg by mouth 2 (two) times daily. Patient not taking: Reported on 04/21/2022 12/24/21   [provider]  Cholecalciferol (VITAMIN D) 50 MCG (2000 UT) tablet Take 4,000 Units by mouth daily.    [provider]  clindamycin (CLEOCIN T) 1 %  external solution Apply 1 application topically 2 (two) times daily as needed (for breakout).     [provider]  diazepam (VALIUM) 5 MG tablet Take 1 tablet (5 mg total) by mouth every 12 (twelve) hours as needed for muscle spasms. Patient not taking: Reported on 04/21/2022 06/07/20   Carrie Mew, MD  gabapentin (NEURONTIN) 100 MG capsule Take 100 mg by mouth 3 (three) times daily as needed. Patient not taking: Reported on 04/21/2022 12/24/21   [provider]  lidocaine (LIDODERM) 5 % 1 patch daily. Patient not taking: Reported on 04/21/2022 12/24/21   [provider]  oxyCODONE (OXY IR/ROXICODONE) 5 MG immediate release tablet Take 5 mg by mouth every 4 (four) hours as needed. Patient not taking: Reported on 04/21/2022 12/24/21   [provider]  tadalafil (CIALIS) 5 MG tablet Take 10 mg by mouth daily as needed for erectile dysfunction.  Patient not taking: Reported on 04/21/2022 07/14/17   [provider]  temazepam (RESTORIL) 15 MG capsule Take by mouth. 12/03/20 01/02/21   [provider]    Physical Exam: Vitals:   04/21/22 1123 04/21/22 1124 04/21/22 1451 04/21/22 1806  BP: (!) 125/93  127/77 120/70  Pulse: 68  63 68  Resp: _0 Temp: 98.3 F (36.8 C)  (!) 97.4 F (36.3 C) 98 F (36.7 C)  TempSrc:   Oral Oral  SpO2: 95%  100% 100%  Weight:  98.9 kg    Height:  _1  (1.93 m)     Physical Exam Vitals and nursing note reviewed.  Constitutional:      General: He is not in acute distress. HENT:     Head: Normocephalic and atraumatic.  Cardiovascular:     Rate and Rhythm: Normal rate and regular rhythm.     Heart sounds: Normal heart sounds.  Pulmonary:     Effort: Pulmonary effort is normal.     Breath sounds: Normal breath sounds.  Abdominal:     Palpations: Abdomen is soft.     Tenderness: There is no abdominal tenderness.  Neurological:     Mental Status: Mental status is at baseline.     Labs on Admission: I have personally reviewed following labs and imaging studies  CBC: Recent Labs  Lab 04/21/22 1127 04/21/22 1621  WBC 8.4  --   HGB 16.4 15.9  HCT 48.6 46.8  MCV 98.0  --   PLT 213  --    Basic Metabolic Panel: Recent Labs  Lab 04/21/22 1127  NA 138  K 4.3  CL 107  CO2 24  GLUCOSE 102*  BUN 26*  CREATININE 1.00  CALCIUM 9.0   GFR: Estimated Creatinine Clearance: 83.2 mL/min (by C-G formula based on SCr of 1 mg/dL). Liver Function Tests: Recent Labs  Lab 04/21/22 1127  AST 38  ALT 34  ALKPHOS 112  BILITOT 1.9*  PROT 6.9  ALBUMIN 3.7   No results for input(s): "LIPASE", "AMYLASE" in the last 168 hours. No results for input(s): "AMMONIA" in the last 168 hours. Coagulation Profile: Recent Labs  Lab 04/21/22 1621  INR 1.3*   Cardiac Enzymes: No results for input(s): "CKTOTAL", "CKMB", "CKMBINDEX", "TROPONINI" in the last 168 hours. BNP (last 3 results) No results for input(s): "PROBNP" in the last 8760 hours. HbA1C: No results for input(s): "HGBA1C" in the last 72 hours. CBG: No  results for input(s): "GLUCAP" in the last 168 hours. Lipid Profile: No results for input(s): "CHOL", "HDL", "LDLCALC", "TRIG", "CHOLHDL", "  LDLDIRECT" in the last 72 hours. Thyroid Function Tests: No results for input(s): "TSH", "T4TOTAL", "FREET4", "T3FREE", "THYROIDAB" in the last 72 hours. Anemia Panel: No results for input(s): "VITAMINB12", "FOLATE", "FERRITIN", "TIBC", "IRON", "RETICCTPCT" in the last 72 hours. Urine analysis:    Component Value Date/Time   COLORURINE YELLOW (A) 06/07/2020 2210   APPEARANCEUR CLEAR (A) 06/07/2020 2210   APPEARANCEUR Clear 10/17/2013 0057   LABSPEC 1.021 06/07/2020 2210   LABSPEC 1.033 10/17/2013 0057   PHURINE 5.0 06/07/2020 2210   GLUCOSEU NEGATIVE 06/07/2020 2210   GLUCOSEU Negative 10/17/2013 0057   HGBUR NEGATIVE 06/07/2020 2210   BILIRUBINUR NEGATIVE 06/07/2020 2210   BILIRUBINUR Negative 10/17/2013 0057   KETONESUR NEGATIVE 06/07/2020 2210   PROTEINUR NEGATIVE 06/07/2020 2210   NITRITE NEGATIVE 06/07/2020 2210   LEUKOCYTESUR NEGATIVE 06/07/2020 2210   LEUKOCYTESUR Negative 10/17/2013 0057    Radiological Exams on Admission: CT Angio Abd/Pel W and/or Wo Contrast  Result Date: 04/21/2022 CLINICAL DATA:  Rectal bleeding x3 EXAM: CTA ABDOMEN AND PELVIS WITHOUT AND WITH CONTRAST TECHNIQUE: Multidetector CT imaging of the abdomen and pelvis was performed using the standard protocol during bolus administration of intravenous contrast. Multiplanar reconstructed images and MIPs were obtained and reviewed to evaluate the vascular anatomy. RADIATION DOSE REDUCTION: This exam was performed according to the departmental dose-optimization program which includes automated exposure control, adjustment of the mA and/or kV according to patient size and/or use of iterative reconstruction technique. CONTRAST:  190m OMNIPAQUE IOHEXOL 350 MG/ML SOLN COMPARISON:  10/17/2013 FINDINGS: VASCULAR Aorta: Mild calcified atheromatous plaque. No aneurysm, dissection, or  stenosis. Celiac: Calcified ostial plaque without significant stenosis, patent distally. SMA: Calcified ostial plaque without significant stenosis, patent distally. Replaced right hepatic arterial supply, an anatomic variant. Renals: Duplicated left, posterior dominant, patent. Duplicated right, superior dominant, mildly atheromatous but patent. IMA: Patent without evidence of aneurysm, dissection, vasculitis or significant stenosis. Inflow: Left common iliac artery distal fusiform aneurysmal dilatation up to 2 cm diameter, right 1.6 cm. Tortuous iliac arterial systems without dissection or stenosis. Proximal Outflow: Minimally atheromatous, patent. Veins: Patent hepatic veins, portal vein, SMV, splenic vein, bilateral renal veins. Iliac venous system incompletely opacified. IVC normal. Review of the MIP images confirms the above findings. NON-VASCULAR Lower chest: No pleural or pericardial effusion. Scattered coronary calcifications. Pulmonary emphysema. Hepatobiliary: Micronodular contour suggesting cirrhosis. No focal lesion or biliary ductal dilatation. Post cholecystectomy. Pancreas: Unremarkable. No pancreatic ductal dilatation or surrounding inflammatory changes. Spleen: Normal in size without focal abnormality. Adrenals/Urinary Tract: No adrenal mass. Symmetric renal enhancement without focal lesion or hydronephrosis. No urolithiasis. The urinary bladder incompletely distended. Stomach/Bowel: No evidence of active GI bleed. Stomach and small bowel are nondistended. Appendix not identified. The colon is incompletely distended, with a few scattered sigmoid diverticula; no adjacent inflammatory change. Lymphatic: No abdominal or pelvic adenopathy. Reproductive: Mild prostate enlargement with central coarse calcifications. Other: No ascites.  No free air. Musculoskeletal: Mild vertebral compression deformities of T11 and L1, stable since 10/16/2021. Multilevel lumbar spondylitic change. No acute findings.  IMPRESSION: 1. No active GI bleed or other acute vascular finding. 2. Sigmoid diverticulosis 3. 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 4. Coronary and Aortic Atherosclerosis (ICD10-170.0). 5. Nodular hepatic contour suggesting cirrhosis, without stigmata of portal venous hypertension. 6.  Emphysema (ICD10-J43.9). Electronically Signed   By: DLucrezia EuropeM.D.   On: 04/21/2022 17:44     Data Reviewed: Relevant notes from primary care and specialist visits, past discharge summaries as available in EHR, including Care  Everywhere. Prior diagnostic testing as pertinent to current admission diagnoses Updated medications and problem lists for reconciliation ED course, including vitals, labs, imaging, treatment and response to treatment Triage notes, nursing and pharmacy notes and ED provider's notes Notable results as noted in HPI   Assessment and Plan: * Hematochezia Patient had several episodes of hematochezia colonoscopy February 2023 positive for multiple diverticula and nonbleeding grade 1 internal hemorrhoids N.p.o. from midnight Serial H&H GI consult   Pulmonary hypertension, mild (HCC) No acute issues  Hypertension Blood pressure controlled.  Continue propranolol  OSA treated with BiPAP Nighttime BiPAP  Atrial fibrillation (HCC) Hold Eliquis Continue propranolol  COPD (chronic obstructive pulmonary disease) (HCC) Not acutely exacerbated Continue home inhalers DuoNebs as needed        DVT prophylaxis: SCD  Consults: GI, Dr. Haig Prophet  Advance Care Planning:   Code Status: Prior   Family Communication: none  Disposition Plan: Back to previous home environment  Severity of Illness: The appropriate patient status for this patient is OBSERVATION. Observation status is judged to be reasonable and necessary in order to provide the required intensity of service to ensure the patient's safety. The patient's presenting symptoms, physical exam findings, and initial  radiographic and laboratory data in the context of their medical condition is felt to place them at decreased risk for further clinical deterioration. Furthermore, it is anticipated that the patient will be medically stable for discharge from the hospital within 2 midnights of admission.   Author: Athena Masse, MD 04/21/2022 6:38 PM  For on call review [email or web address].

## 2022-04-21 NOTE — ED Notes (Signed)
Pt awake and alert states he doesn't need anything at this time. Vital signs stable

## 2022-04-21 NOTE — Assessment & Plan Note (Signed)
Hold Eliquis Continue propranolol

## 2022-04-21 NOTE — Assessment & Plan Note (Signed)
No acute issues.

## 2022-04-21 NOTE — ED Triage Notes (Addendum)
Pt arrives with c/o rectal bleeding that started this morning. Per pt, there are bright red blood in his stool this morning. Pt has had 3 episode this morning and the last was just blood no stool. Pt takes eliquis.

## 2022-04-21 NOTE — Assessment & Plan Note (Signed)
Patient had several episodes of hematochezia colonoscopy February 2023 positive for multiple diverticula and nonbleeding grade 1 internal hemorrhoids N.p.o. from midnight Serial H&H GI consult

## 2022-04-22 DIAGNOSIS — Z961 Presence of intraocular lens: Secondary | ICD-10-CM | POA: Diagnosis present

## 2022-04-22 DIAGNOSIS — K760 Fatty (change of) liver, not elsewhere classified: Secondary | ICD-10-CM | POA: Diagnosis present

## 2022-04-22 DIAGNOSIS — Z85828 Personal history of other malignant neoplasm of skin: Secondary | ICD-10-CM | POA: Diagnosis not present

## 2022-04-22 DIAGNOSIS — Z9049 Acquired absence of other specified parts of digestive tract: Secondary | ICD-10-CM | POA: Diagnosis not present

## 2022-04-22 DIAGNOSIS — Z7901 Long term (current) use of anticoagulants: Secondary | ICD-10-CM | POA: Diagnosis not present

## 2022-04-22 DIAGNOSIS — G4733 Obstructive sleep apnea (adult) (pediatric): Secondary | ICD-10-CM | POA: Diagnosis present

## 2022-04-22 DIAGNOSIS — Z89421 Acquired absence of other right toe(s): Secondary | ICD-10-CM | POA: Diagnosis not present

## 2022-04-22 DIAGNOSIS — K5731 Diverticulosis of large intestine without perforation or abscess with bleeding: Secondary | ICD-10-CM | POA: Diagnosis present

## 2022-04-22 DIAGNOSIS — F32A Depression, unspecified: Secondary | ICD-10-CM | POA: Diagnosis present

## 2022-04-22 DIAGNOSIS — J439 Emphysema, unspecified: Secondary | ICD-10-CM | POA: Diagnosis present

## 2022-04-22 DIAGNOSIS — Z9841 Cataract extraction status, right eye: Secondary | ICD-10-CM | POA: Diagnosis not present

## 2022-04-22 DIAGNOSIS — Z974 Presence of external hearing-aid: Secondary | ICD-10-CM | POA: Diagnosis not present

## 2022-04-22 DIAGNOSIS — I700 Atherosclerosis of aorta: Secondary | ICD-10-CM | POA: Diagnosis present

## 2022-04-22 DIAGNOSIS — K921 Melena: Secondary | ICD-10-CM | POA: Diagnosis present

## 2022-04-22 DIAGNOSIS — Z885 Allergy status to narcotic agent status: Secondary | ICD-10-CM | POA: Diagnosis not present

## 2022-04-22 DIAGNOSIS — Z79899 Other long term (current) drug therapy: Secondary | ICD-10-CM | POA: Diagnosis not present

## 2022-04-22 DIAGNOSIS — I272 Pulmonary hypertension, unspecified: Secondary | ICD-10-CM | POA: Diagnosis present

## 2022-04-22 DIAGNOSIS — Z7951 Long term (current) use of inhaled steroids: Secondary | ICD-10-CM | POA: Diagnosis not present

## 2022-04-22 DIAGNOSIS — Z86711 Personal history of pulmonary embolism: Secondary | ICD-10-CM | POA: Diagnosis not present

## 2022-04-22 DIAGNOSIS — I10 Essential (primary) hypertension: Secondary | ICD-10-CM | POA: Diagnosis present

## 2022-04-22 DIAGNOSIS — Z9842 Cataract extraction status, left eye: Secondary | ICD-10-CM | POA: Diagnosis not present

## 2022-04-22 DIAGNOSIS — I4819 Other persistent atrial fibrillation: Secondary | ICD-10-CM | POA: Diagnosis present

## 2022-04-22 DIAGNOSIS — Z89422 Acquired absence of other left toe(s): Secondary | ICD-10-CM | POA: Diagnosis not present

## 2022-04-22 DIAGNOSIS — Z87442 Personal history of urinary calculi: Secondary | ICD-10-CM | POA: Diagnosis not present

## 2022-04-22 DIAGNOSIS — K922 Gastrointestinal hemorrhage, unspecified: Principal | ICD-10-CM | POA: Diagnosis present

## 2022-04-22 LAB — HEMOGLOBIN: Hemoglobin: 14 g/dL (ref 13.0–17.0)

## 2022-04-22 MED ORDER — AZITHROMYCIN 250 MG PO TABS
250.0000 mg | ORAL_TABLET | Freq: Every day | ORAL | Status: DC
  Administered 2022-04-22: 250 mg via ORAL
  Filled 2022-04-22: qty 1

## 2022-04-22 NOTE — ED Notes (Signed)
Pt independently ambulatory to and from restroom and back to bed

## 2022-04-22 NOTE — ED Notes (Signed)
This RN to bedside. Pt resting on IP bed at this time. Respirations are even and unlabored, skin AfE/WD. Pt denies needs at this time. Call light is in reach, [facility].

## 2022-04-22 NOTE — Progress Notes (Signed)
[place] [facility] [place] NAME: Billy Freeman    MR#:  [id number]  DATE OF BIRTH:  1950/12/23  SUBJECTIVE:  patient came in with hematochezia started yesterday. Had a few bowel movements at home and two of them yesterday evening. Months overnight and today. Denies any abdominal pain. Had colonoscopy in February 2023. Has been taking eliquis. No family at bedside.    VITALS:  Blood pressure 129/89, pulse 66, temperature 98 F (36.7 C), temperature source Oral, resp. rate 19, height '6\' 4"'$  (1.93 m), weight 98.9 kg, SpO2 98 %.  PHYSICAL EXAMINATION:   GENERAL:  71 y.o.-year-old patient lying in the bed with no acute distress.  LUNGS: Normal breath sounds bilaterally, no wheezing, rales, rhonchi.  CARDIOVASCULAR: S1, S2 normal. No murmurs, rubs, or gallops.  ABDOMEN: Soft, nontender, nondistended. Bowel sounds present.  EXTREMITIES: No  edema b/l.    NEUROLOGIC: nonfocal  patient is alert and awake SKIN: No obvious rash, lesion, or ulcer.   LABORATORY PANEL:  CBC Recent Labs  Lab 04/21/22 1127 04/21/22 1621 04/22/22 0419  WBC 8.4  --   --   HGB 16.4 15.9 14.0  HCT 48.6 46.8  --   PLT 213  --   --     Chemistries  Recent Labs  Lab 04/21/22 1127  NA 138  K 4.3  CL 107  CO2 24  GLUCOSE 102*  BUN 26*  CREATININE 1.00  CALCIUM 9.0  AST 38  ALT 34  ALKPHOS 112  BILITOT 1.9*   Cardiac Enzymes No results for input(s): "TROPONINI" in the last 168 hours. RADIOLOGY:  CT Angio Abd/Pel W and/or Wo Contrast  Result Date: 04/21/2022 CLINICAL DATA:  Rectal bleeding x3 EXAM: CTA ABDOMEN AND PELVIS WITHOUT AND WITH CONTRAST TECHNIQUE: Multidetector CT imaging of the abdomen and pelvis was performed using the standard protocol during bolus administration of intravenous contrast. Multiplanar reconstructed images and MIPs were obtained and reviewed to evaluate the vascular anatomy. RADIATION DOSE REDUCTION: This exam was performed according to  the departmental dose-optimization program which includes automated exposure control, adjustment of the mA and/or kV according to patient size and/or use of iterative reconstruction technique. CONTRAST:  160m OMNIPAQUE IOHEXOL 350 MG/ML SOLN COMPARISON:  10/17/2013 FINDINGS: VASCULAR Aorta: Mild calcified atheromatous plaque. No aneurysm, dissection, or stenosis. Celiac: Calcified ostial plaque without significant stenosis, patent distally. SMA: Calcified ostial plaque without significant stenosis, patent distally. Replaced right hepatic arterial supply, an anatomic variant. Renals: Duplicated left, posterior dominant, patent. Duplicated right, superior dominant, mildly atheromatous but patent. IMA: Patent without evidence of aneurysm, dissection, vasculitis or significant stenosis. Inflow: Left common iliac artery distal fusiform aneurysmal dilatation up to 2 cm diameter, right 1.6 cm. Tortuous iliac arterial systems without dissection or stenosis. Proximal Outflow: Minimally atheromatous, patent. Veins: Patent hepatic veins, portal vein, SMV, splenic vein, bilateral renal veins. Iliac venous system incompletely opacified. IVC normal. Review of the MIP images confirms the above findings. NON-VASCULAR Lower chest: No pleural or pericardial effusion. Scattered coronary calcifications. Pulmonary emphysema. Hepatobiliary: Micronodular contour suggesting cirrhosis. No focal lesion or biliary ductal dilatation. Post cholecystectomy. Pancreas: Unremarkable. No pancreatic ductal dilatation or surrounding inflammatory changes. Spleen: Normal in size without focal abnormality. Adrenals/Urinary Tract: No adrenal mass. Symmetric renal enhancement without focal lesion or hydronephrosis. No urolithiasis. The urinary bladder incompletely distended. Stomach/Bowel: No evidence of active GI bleed. Stomach and small bowel are nondistended. Appendix not identified. The colon is incompletely distended, with a few scattered sigmoid  diverticula; no adjacent inflammatory change. Lymphatic: No abdominal or pelvic adenopathy. Reproductive: Mild prostate enlargement with central coarse calcifications. Other: No ascites.  No free air. Musculoskeletal: Mild vertebral compression deformities of T11 and L1, stable since 10/16/2021. Multilevel lumbar spondylitic change. No acute findings. IMPRESSION: 1. No active GI bleed or other acute vascular finding. 2. Sigmoid diverticulosis 3. 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 4. Coronary and Aortic Atherosclerosis (ICD10-170.0). 5. Nodular hepatic contour suggesting cirrhosis, without stigmata of portal venous hypertension. 6.  Emphysema (ICD10-J43.9). Electronically Signed   By: Lucrezia Europe M.D.   On: 04/21/2022 17:44    Assessment and Plan Billy Freeman. is a 71 y.o. male with medical history significant for COPD, HTN, OSA on BiPAP, pulmonary hypertension, past history of PE, diagnosed with atrial fibrillation in March 2023 when he was started on Eliquis, who presents to the ED for evaluation of rectal bleeding.  Patient reports that he felt like he was having diarrhea and when he wiped he saw blood.  He went on to have several more episodes, up to 6, with the last one being blood without stool.  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   Upper endoscopy at the same time showed duodenitis and gastritis.  Hematochezia --Patient had several episodes of hematochezia --colonoscopy February 2023 positive for multiple diverticula and nonbleeding grade 1 internal hemorrhoids --hgb stable GI consult with Dr locklear--CLD and cont to observe   Pulmonary hypertension, mild  ([place]) No acute issues   Hypertension Blood pressure controlled.  Continue propranolol   OSA treated with BiPAP Nighttime BiPAP   Atrial fibrillation (HCC) Hold Eliquis Continue propranolol   COPD (chronic obstructive pulmonary disease) (HCC) Not acutely exacerbated Continue home inhalers DuoNebs as needed   Procedures:none Family communication :none Consults :GI CODE STATUS: full DVT Prophylaxis :SCD (gi bleed) Level of care: Telemetry Medical Status is: Observation The patient remains OBS appropriate and will d/c before 2 midnights.    TOTAL TIME TAKING CARE OF THIS PATIENT: 35 minutes.  >50% time spent on counselling and coordination of care  Note: This dictation was prepared with Dragon dictation along with smaller phrase technology. Any transcriptional errors that result from this process are unintentional.  Fritzi Mandes M.D    [facility]   CC: Primary care physician; Tracie Harrier, MD

## 2022-04-22 NOTE — ED Notes (Signed)
Meal tray provided to pt by dietary. [place] given to pt per request. No further needs at this time, [facility].

## 2022-04-22 NOTE — Consult Note (Signed)
Consultation  Referring Provider:    Dr Damita Dunnings  Admit date 04/21/22 Consult date        04/23/22 Reason for Consultation:     lower gib         HPI:   Billy Freeman. is a 71 y.o. male with medical history significant for COPD, HTN, OSA on BiPAP, pulmonary hypertension, past history of PE, diagnosed with atrial fibrillation in March 2023 when he was started on Eliquis, who presented to the ED yesterday  for evaluation of rectal bleeding. His hemoglobin in normal and he is hemodynamically stable. He reports he was in his usual state of health until yesterday- felt sudden urge to defecate, went to commode and passed red lquidy material. This happened 4 more times and he came to ED for eval. States this last occurred last night around 8pm. He denies any abdominal pain, constipation, dyspspsia and other GI concerns. States he has never had anything like this in the past. Had CTA ab/pelvis yesterday- IMPRESSION: 1. No active GI bleed or other acute vascular finding. 2. Sigmoid diverticulosis 3. 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 4. Coronary and Aortic Atherosclerosis (ICD10-170.0). 5. Nodular hepatic contour suggesting cirrhosis, without stigmata of portal venous hypertension. 6.  Emphysema (ICD10-J43.9). Note his liver enzymes are normal; platelet count normal, spleen normal, and pt/inr normal. He has no formal diagnosis of cirrhosis. He is being evaluated for COPD at [facility] and plans to have A1A testing. States several members of his family have had diverticulitis in past  PREVIOUS ENDOSCOPIES:            EGD 08/2021- Dr Virgina Jock- duodenitis, erosive gastritis, esophagus unremarkable, there was no h pylori, barretts, dysplasia, malignancy.  Colonoscopy 08/2021- Dr Virgina Jock- fair prep, 4 adenomas removed   Past Medical History:  Diagnosis Date   Atrial fibrillation ([place])    Basal cell carcinoma    Cancer ([place])    SKIN   Cirrhosis ([place])    POSSIBLE   COPD (chronic obstructive pulmonary  disease) ([place])    Depression    Diverticulosis    Dysrhythmia    a-fib   Edema    right FEET/LEG   Fatty liver    GERD (gastroesophageal reflux disease)    h/o   Glaucoma (increased eye pressure)    Gout    Heart murmur    History of kidney stones    HOH (hard of hearing)    Hypertension    Pneumonia    January/February 2020   Pulmonary embolism ([place])    Pulmonary nodules    Reflux    Rosacea    Sleep apnea    cpap   Tibial fracture 12/21/2021   Tremors of nervous system    Wears hearing aid     Past Surgical History:  Procedure Laterality Date   ANTERIOR CRUCIATE LIGAMENT (ACL) REVISION     broken nose surgery     BRONCHOSCOPY     BUNIONECTOMY Bilateral    CATARACT EXTRACTION W/PHACO Right 02/09/2018   Procedure: CATARACT EXTRACTION PHACO AND INTRAOCULAR LENS PLACEMENT ([place]);  Surgeon: Birder Robson, MD;  Location: [facility] ORS;  Service: Ophthalmology;  Laterality: Right;  [place] 00:45 AP% 14.4 CDE 6.58 Fluid pack lot # [id number]   CATARACT EXTRACTION W/PHACO Left 12/14/2018   Procedure: CATARACT EXTRACTION PHACO AND INTRAOCULAR LENS PLACEMENT (I[facility] LEFT;  Surgeon: Birder Robson, MD;  Location: [place];  Service: Ophthalmology;  Laterality: Left;   CHOLECYSTECTOMY     COLONOSCOPY  N/A 09/16/2021   Procedure: COLONOSCOPY;  Surgeon: Annamaria Helling, DO;  Location: [facility] ENDOSCOPY;  Service: Gastroenterology;  Laterality: N/A;   ESOPHAGOGASTRODUODENOSCOPY N/A 09/16/2021   Procedure: ESOPHAGOGASTRODUODENOSCOPY (EGD);  Surgeon: Annamaria Helling, DO;  Location: [facility] ENDOSCOPY;  Service: Gastroenterology;  Laterality: N/A;   EYE SURGERY     FLEXIBLE BRONCHOSCOPY N/A 04/30/2017   Procedure: FLEXIBLE BRONCHOSCOPY;  Surgeon: Laverle Hobby, MD;  Location: [facility] ORS;  Service: Pulmonary;  Laterality: N/A;   KIDNEY STONE SURGERY     LIVER BIOPSY     MENISCUS REPAIR Bilateral    right knee x 3, left knee x 2   RIGHT HEART CATH N/A 12/31/2021    Procedure: RIGHT HEART CATH;  Surgeon: Isaias Cowman, MD;  Location: [place] [facility];  Service: Cardiovascular;  Laterality: N/A;   SHOULDER ARTHROSCOPY WITH ROTATOR CUFF REPAIR AND SUBACROMIAL DECOMPRESSION Right 02/01/2019   Procedure: Extensive arthroscopic debridement, arthroscopic subacromial decompression, mini-open rotator cuff repair supplemented by a [employer] Regeneten patch, and mini-open biceps tenodesis, right shoulder.;  Surgeon: Corky Mull, MD;  Location: [facility] ORS;  Service: Orthopedics;  Laterality: Right;   TOE AMPUTATION Bilateral    index toes    History reviewed. No pertinent family history.   Social History   Tobacco Use   Smoking status: Never   Smokeless tobacco: Never  Vaping Use   Vaping Use: Never used  Substance Use Topics   Alcohol use: Not Currently    Alcohol/week: 5.0 standard drinks of alcohol    Types: 5 Glasses of wine per week   Drug use: Not Currently    Types: Marijuana    Comment: Friday 09/13/20 Edibles    Prior to Admission medications   Medication Sig Start Date End Date Taking? Authorizing Provider  allopurinol (ZYLOPRIM) 100 MG tablet Take 1 tablet by mouth daily. 10/24/20  Yes [provider]  apixaban (ELIQUIS) 5 MG TABS tablet Take 1 tablet (5 mg total) by mouth 2 (two) times daily. Take 10 mg (two pills) twice daily for seven days, then take 40m (one pill) twice daily. Patient taking differently: Take 5 mg by mouth 2 (two) times daily. 09/19/16  Yes RMerlyn Lot MD  azithromycin (ZITHROMAX) 250 MG tablet Take 250 mg by mouth daily.   Yes [provider]  budesonide-formoterol (SYMBICORT) 160-4.5 MCG/ACT inhaler Inhale 2 puffs into the lungs 2 (two) times daily.   Yes [provider]  citalopram (CELEXA) 20 MG tablet Take 20 mg by mouth every morning.  03/28/14  Yes [provider]  Omega-3 Fatty Acids (FISH OIL) 1200 MG CAPS Take 1,200 mg by mouth 2 (two) times daily.    Yes  [provider]  propranolol (INDERAL) 10 MG tablet Take 1 tablet by mouth 2 (two) times daily. 11/05/20  Yes [provider]  temazepam (RESTORIL) 15 MG capsule Take 15 mg by mouth at bedtime.  03/19/14  Yes [provider]  vitamin B-12 (CYANOCOBALAMIN) 500 MCG tablet Take 500 mcg by mouth daily.   Yes [provider]  Acetaminophen Extra Strength 500 MG TABS Take 2 tablets by mouth every 8 (eight) hours as needed. 12/24/21   [provider]  allopurinol (ZYLOPRIM) 100 MG tablet Take 100 mg by mouth every morning.  12/15/17 10/05/19  [provider]  celecoxib (CELEBREX) 200 MG capsule Take 200 mg by mouth 2 (two) times daily. Patient not taking: Reported on 04/21/2022 12/24/21   [provider]  Cholecalciferol (VITAMIN D)  50 MCG (2000 UT) tablet Take 4,000 Units by mouth daily.    [provider]  clindamycin (CLEOCIN T) 1 % external solution Apply 1 application topically 2 (two) times daily as needed (for breakout).     [provider]  diazepam (VALIUM) 5 MG tablet Take 1 tablet (5 mg total) by mouth every 12 (twelve) hours as needed for muscle spasms. Patient not taking: Reported on 04/21/2022 06/07/20   Carrie Mew, MD  gabapentin (NEURONTIN) 100 MG capsule Take 100 mg by mouth 3 (three) times daily as needed. Patient not taking: Reported on 04/21/2022 12/24/21   [provider]  lidocaine (LIDODERM) 5 % 1 patch daily. Patient not taking: Reported on 04/21/2022 12/24/21   [provider]  oxyCODONE (OXY IR/ROXICODONE) 5 MG immediate release tablet Take 5 mg by mouth every 4 (four) hours as needed. Patient not taking: Reported on 04/21/2022 12/24/21   [provider]  tadalafil (CIALIS) 5 MG tablet Take 10 mg by mouth daily as needed for erectile dysfunction.  Patient not taking: Reported on 04/21/2022 07/14/17   [provider]  temazepam (RESTORIL) 15 MG capsule Take by mouth.  12/03/20 01/02/21  [provider]    Current Facility-Administered Medications  Medication Dose Route Frequency Provider Last Rate Last Admin   0.9 %  sodium chloride infusion   Intravenous Continuous Fritzi Mandes, MD 50 mL/hr at 04/22/22 1304 Rate Change at 04/22/22 1304   acetaminophen (TYLENOL) tablet 650 mg  650 mg Oral Q6H PRN Athena Masse, MD       Or   acetaminophen (TYLENOL) suppository 650 mg  650 mg Rectal Q6H PRN Athena Masse, MD       azithromycin [facility]) tablet 250 mg  250 mg Oral Daily Fritzi Mandes, MD       citalopram (CELEXA) tablet 20 mg  20 mg Oral Morton Peters, Onalee Hua V, MD   20 mg at 04/22/22 0762   HYDROcodone-acetaminophen (NORCO/VICODIN) 5-325 MG per tablet 1-2 tablet  1-2 tablet Oral Q4H PRN Athena Masse, MD       morphine (PF) 2 MG/ML injection 2 mg  2 mg Intravenous Q2H PRN Athena Masse, MD       ondansetron [facility]) tablet 4 mg  4 mg Oral Q6H PRN Athena Masse, MD       Or   ondansetron [facility]) injection 4 mg  4 mg Intravenous Q6H PRN Athena Masse, MD       propranolol (INDERAL) tablet 10 mg  10 mg Oral BID Athena Masse, MD   10 mg at 04/22/22 0928   temazepam (RESTORIL) capsule 15 mg  15 mg Oral QHS Athena Masse, MD   15 mg at 04/21/22 2229   Current Outpatient Medications  Medication Sig Dispense Refill   allopurinol (ZYLOPRIM) 100 MG tablet Take 1 tablet by mouth daily.     apixaban (ELIQUIS) 5 MG TABS tablet Take 1 tablet (5 mg total) by mouth 2 (two) times daily. Take 10 mg (two pills) twice daily for seven days, then take 12m (one pill) twice daily. (Patient taking differently: Take 5 mg by mouth 2 (two) times daily.) 60 tablet 0   azithromycin (ZITHROMAX) 250 MG tablet Take 250 mg by mouth daily.     budesonide-formoterol (SYMBICORT) 160-4.5 MCG/ACT inhaler Inhale 2 puffs into the lungs 2 (two) times daily.     citalopram (CELEXA) 20 MG tablet Take 20 mg by mouth every morning.  Omega-3 Fatty Acids (FISH OIL) 1200 MG CAPS  Take 1,200 mg by mouth 2 (two) times daily.      propranolol (INDERAL) 10 MG tablet Take 1 tablet by mouth 2 (two) times daily.     temazepam (RESTORIL) 15 MG capsule Take 15 mg by mouth at bedtime.      vitamin B-12 (CYANOCOBALAMIN) 500 MCG tablet Take 500 mcg by mouth daily.     Acetaminophen Extra Strength 500 MG TABS Take 2 tablets by mouth every 8 (eight) hours as needed.     allopurinol (ZYLOPRIM) 100 MG tablet Take 100 mg by mouth every morning.      celecoxib (CELEBREX) 200 MG capsule Take 200 mg by mouth 2 (two) times daily. (Patient not taking: Reported on 04/21/2022)     Cholecalciferol (VITAMIN D) 50 MCG (2000 UT) tablet Take 4,000 Units by mouth daily.     clindamycin (CLEOCIN T) 1 % external solution Apply 1 application topically 2 (two) times daily as needed (for breakout).      diazepam (VALIUM) 5 MG tablet Take 1 tablet (5 mg total) by mouth every 12 (twelve) hours as needed for muscle spasms. (Patient not taking: Reported on 04/21/2022) 5 tablet 0   gabapentin (NEURONTIN) 100 MG capsule Take 100 mg by mouth 3 (three) times daily as needed. (Patient not taking: Reported on 04/21/2022)     lidocaine (LIDODERM) 5 % 1 patch daily. (Patient not taking: Reported on 04/21/2022)     oxyCODONE (OXY IR/ROXICODONE) 5 MG immediate release tablet Take 5 mg by mouth every 4 (four) hours as needed. (Patient not taking: Reported on 04/21/2022)     tadalafil (CIALIS) 5 MG tablet Take 10 mg by mouth daily as needed for erectile dysfunction.  (Patient not taking: Reported on 04/21/2022)     temazepam (RESTORIL) 15 MG capsule Take by mouth.      Allergies as of 04/21/2022 - Review Complete 04/21/2022  Allergen Reaction Noted   Oxycodone Itching 09/19/2016     Review of Systems:    All systems reviewed and negative except where noted in HPI. With exception of DOE  Physical Exam:  Vital signs in last 24 hours: Temp:  [97.4 F (36.3 C)-98.7 F (37.1 C)] 98 F (36.7 C) (09/26 0934) Pulse Rate:   [60-76] 70 (09/26 1230) Resp:  [18-19] 19 (09/26 0934) BP: (108-143)/(65-103) 141/85 (09/26 1230) SpO2:  [93 %-100 %] 100 % (09/26 1230)   General:   Pleasant man in NAD Head:  Normocephalic and atraumatic. Eyes:   No icterus.   Conjunctiva pink. Ears:  Normal auditory acuity. Mouth: Mucosa pink moist, no lesions. Neck:  Supple; no masses felt Lungs:  Respirations even and unlabored. Lungs clear to auscultation bilaterally.   No wheezes, crackles, or rhonchi.  Heart:  S1S2, RRR, no MRG. No edema. Abdomen:   Flat, soft, nondistended, nontender. Normal bowel sounds. No appreciable masses or hepatomegaly. No rebound signs or other peritoneal signs. Rectal:  Not performed.  Msk:  MAEW x4, No clubbing or cyanosis. Strength 5/5. Symmetrical without gross deformities. Neurologic:  Alert and  oriented x4;  Cranial nerves II-XII intact.  Skin:  Warm, dry, pink without significant lesions or rashes. Psych:  Alert and cooperative. Normal affect.  LAB RESULTS: Recent Labs    04/21/22 1127 04/21/22 1621 04/22/22 0419  WBC 8.4  --   --   HGB 16.4 15.9 14.0  HCT 48.6 46.8  --   PLT 213  --   --  BMET Recent Labs    04/21/22 1127  NA 138  K 4.3  CL 107  CO2 24  GLUCOSE 102*  BUN 26*  CREATININE 1.00  CALCIUM 9.0   LFT Recent Labs    04/21/22 1127  PROT 6.9  ALBUMIN 3.7  AST 38  ALT 34  ALKPHOS 112  BILITOT 1.9*   PT/INR Recent Labs    04/21/22 1621  LABPROT 15.9*  INR 1.3*    STUDIES: CT Angio Abd/Pel W and/or Wo Contrast  Result Date: 04/21/2022 CLINICAL DATA:  Rectal bleeding x3 EXAM: CTA ABDOMEN AND PELVIS WITHOUT AND WITH CONTRAST TECHNIQUE: Multidetector CT imaging of the abdomen and pelvis was performed using the standard protocol during bolus administration of intravenous contrast. Multiplanar reconstructed images and MIPs were obtained and reviewed to evaluate the vascular anatomy. RADIATION DOSE REDUCTION: This exam was performed according to the  departmental dose-optimization program which includes automated exposure control, adjustment of the mA and/or kV according to patient size and/or use of iterative reconstruction technique. CONTRAST:  154m OMNIPAQUE IOHEXOL 350 MG/ML SOLN COMPARISON:  10/17/2013 FINDINGS: VASCULAR Aorta: Mild calcified atheromatous plaque. No aneurysm, dissection, or stenosis. Celiac: Calcified ostial plaque without significant stenosis, patent distally. SMA: Calcified ostial plaque without significant stenosis, patent distally. Replaced right hepatic arterial supply, an anatomic variant. Renals: Duplicated left, posterior dominant, patent. Duplicated right, superior dominant, mildly atheromatous but patent. IMA: Patent without evidence of aneurysm, dissection, vasculitis or significant stenosis. Inflow: Left common iliac artery distal fusiform aneurysmal dilatation up to 2 cm diameter, right 1.6 cm. Tortuous iliac arterial systems without dissection or stenosis. Proximal Outflow: Minimally atheromatous, patent. Veins: Patent hepatic veins, portal vein, SMV, splenic vein, bilateral renal veins. Iliac venous system incompletely opacified. IVC normal. Review of the MIP images confirms the above findings. NON-VASCULAR Lower chest: No pleural or pericardial effusion. Scattered coronary calcifications. Pulmonary emphysema. Hepatobiliary: Micronodular contour suggesting cirrhosis. No focal lesion or biliary ductal dilatation. Post cholecystectomy. Pancreas: Unremarkable. No pancreatic ductal dilatation or surrounding inflammatory changes. Spleen: Normal in size without focal abnormality. Adrenals/Urinary Tract: No adrenal mass. Symmetric renal enhancement without focal lesion or hydronephrosis. No urolithiasis. The urinary bladder incompletely distended. Stomach/Bowel: No evidence of active GI bleed. Stomach and small bowel are nondistended. Appendix not identified. The colon is incompletely distended, with a few scattered sigmoid  diverticula; no adjacent inflammatory change. Lymphatic: No abdominal or pelvic adenopathy. Reproductive: Mild prostate enlargement with central coarse calcifications. Other: No ascites.  No free air. Musculoskeletal: Mild vertebral compression deformities of T11 and L1, stable since 10/16/2021. Multilevel lumbar spondylitic change. No acute findings. IMPRESSION: 1. No active GI bleed or other acute vascular finding. 2. Sigmoid diverticulosis 3. 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 4. Coronary and Aortic Atherosclerosis (ICD10-170.0). 5. Nodular hepatic contour suggesting cirrhosis, without stigmata of portal venous hypertension. 6.  Emphysema (ICD10-J43.9). Electronically Signed   By: DLucrezia EuropeM.D.   On: 04/21/2022 17:44       Impression / Plan:   Hematochezia - likely diverticular bleed. He has not bled since last night. Agree with observation/following hemoglobins. We discussed diverticular bleeding and his questions were answered. Will follow with you. Did recommend he follow up as o/p for nonurgent colonoscopy regarding his prep/polyps/liver. If he has any further significant bleeding recommend repeating imaging/possible IR consultation. Would transfuse to keep hgb >7 as clinically indicated  Thank you very much for this consult. These services were provided by CStephens November NP-C, in collaboration with CLesly Rubenstein  MD, with whom I have discussed this patient in full.   Stephens November, NP-C

## 2022-04-23 DIAGNOSIS — K921 Melena: Secondary | ICD-10-CM | POA: Diagnosis not present

## 2022-04-23 MED ORDER — AZITHROMYCIN 250 MG PO TABS
250.0000 mg | ORAL_TABLET | Freq: Every day | ORAL | Status: DC
  Administered 2022-04-23: 250 mg via ORAL
  Filled 2022-04-23: qty 1

## 2022-04-23 NOTE — Progress Notes (Addendum)
PROGRESS NOTE    Billy Freeman.  EXB:[id number] DOB: 03-29-1951 DOA: 04/21/2022 PCP: Tracie Harrier, MD   Brief Narrative: This 71 y.o. male with medical history significant for COPD, HTN, OSA on BiPAP, pulmonary hypertension, past history of PE, diagnosed with atrial fibrillation in March 2023 when he was started on Eliquis, who presents to the ED for evaluation of rectal bleeding.  Patient reports that he felt like he was having diarrhea and when he wiped he saw blood.  He went on to have several more episodes, up to 6, with the last one being blood without stool. He has  abdominal cramping associated with the bowel movements but denies pain. 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 Colonoscopy in February 2023 that was positive for multiple diverticula and nonbleeding grade 1 internal hemorrhoids. EGD at the same time showed duodenitis and gastritis.  Patient is admitted for further evaluation.  Gastroenterology is consulted.  Assessment & Plan:   Principal Problem:   Hematochezia Active Problems:   COPD (chronic obstructive pulmonary disease) (HCC)   Atrial fibrillation (HCC)   History of pulmonary embolism   OSA treated with BiPAP   Hypertension   Pulmonary hypertension, mild (HCC)   Lower GI bleed  Hematochezia: Patient reports several episodes of hematochezia. Colonoscopy February 2023 positive for multiple diverticula and nonbleeding grade 1 internal hemorrhoids H&H remains stable. GI Dr. Haig Prophet consulted.   Advised clear liquid diet and continue to observe. Patient still reports having intermittent bleeding. Diet advanced to soft, has not had a bowel movement yet today.   Pulmonary hypertension: Stable.  No acute issues.   Essential hypertension: Blood pressure controlled.  Continue propranolol.   OSA on BiPAP Continue nighttime BiPAP   Atrial  fibrillation: Hold Eliquis. Continue propranolol   COPD: Not in any acute exacerbation. Continue home inhalers DuoNebs as needed   DVT prophylaxis: SCDs Code Status: Full code Family Communication: No family at bedside Disposition Plan:   Status is: Inpatient Remains inpatient appropriate because: Admitted for hematochezia,  bright red blood per rectum.  Likely diverticular bleed.  H&H remains stable.  GI recommended advance diet and if tolerates okay and has a good bowel movement,  Patient can be discharged.  No plan for any EGD.  Anticipated discharge home on 04/24/22    Consultants:  Gastroenterology  Procedures: None  Antimicrobials: None  Subjective: Patient was seen and examined at bedside.  Overnight events noted.   Patient reports feeling better,  he denies any dizziness.  He still reports while wiping, he noticed blood.   Denies any blood in the stools.  H/H remains stable.  Objective: Vitals:   04/22/22 2102 04/23/22 0359 04/23/22 0404 04/23/22 0757  BP: (!) 138/91 133/88  (!) 138/95  Pulse: 81 78 78 81  Resp: '18 20 20 14  '$ Temp: 98.1 F (36.7 C) 98.7 F (37.1 C) 98.7 F (37.1 C)   TempSrc: Oral Oral Oral   SpO2: 98% 94%  95%  Weight:      Height:        Intake/Output Summary (Last 24 hours) at 04/23/2022 1356 Last data filed at 04/23/2022 0551 Gross per 24 hour  Intake 240 ml  Output 675 ml  Net -435 ml   Filed Weights   04/21/22 1124  Weight: 98.9 kg    Examination:  General exam: Appears comfortable, not in any acute distress.  Deconditioned Respiratory system: CTA bilaterally, no wheezing, no crackles, normal respiratory effort. Cardiovascular system: S1 & S2 heard, Irregular  rhythm, no murmur. Gastrointestinal system: Abdomen is soft, non tender, non distended, BS+ Central nervous system: Alert and oriented X 3. No focal neurological deficits. Extremities: No edema, no cyanosis, no clubbing. Skin: No rashes, lesions or  ulcers Psychiatry: Judgement and insight appear normal. Mood & affect appropriate.     Data Reviewed: I have personally reviewed following labs and imaging studies  CBC: Recent Labs  Lab 04/21/22 1127 04/21/22 1621 04/22/22 0419  WBC 8.4  --   --   HGB 16.4 15.9 14.0  HCT 48.6 46.8  --   MCV 98.0  --   --   PLT 213  --   --    Basic Metabolic Panel: Recent Labs  Lab 04/21/22 1127  NA 138  K 4.3  CL 107  CO2 24  GLUCOSE 102*  BUN 26*  CREATININE 1.00  CALCIUM 9.0   GFR: Estimated Creatinine Clearance: 83.2 mL/min (by C-G formula based on SCr of 1 mg/dL). Liver Function Tests: Recent Labs  Lab 04/21/22 1127  AST 38  ALT 34  ALKPHOS 112  BILITOT 1.9*  PROT 6.9  ALBUMIN 3.7   No results for input(s): "LIPASE", "AMYLASE" in the last 168 hours. No results for input(s): "AMMONIA" in the last 168 hours. Coagulation Profile: Recent Labs  Lab 04/21/22 1621  INR 1.3*   Cardiac Enzymes: No results for input(s): "CKTOTAL", "CKMB", "CKMBINDEX", "TROPONINI" in the last 168 hours. BNP (last 3 results) No results for input(s): "PROBNP" in the last 8760 hours. HbA1C: No results for input(s): "HGBA1C" in the last 72 hours. CBG: No results for input(s): "GLUCAP" in the last 168 hours. Lipid Profile: No results for input(s): "CHOL", "HDL", "LDLCALC", "TRIG", "CHOLHDL", "LDLDIRECT" in the last 72 hours. Thyroid Function Tests: No results for input(s): "TSH", "T4TOTAL", "FREET4", "T3FREE", "THYROIDAB" in the last 72 hours. Anemia Panel: No results for input(s): "VITAMINB12", "FOLATE", "FERRITIN", "TIBC", "IRON", "RETICCTPCT" in the last 72 hours. Sepsis Labs: No results for input(s): "PROCALCITON", "LATICACIDVEN" in the last 168 hours.  No results found for this or any previous visit (from the past 240 hour(s)).   Radiology Studies: CT Angio Abd/Pel W and/or Wo Contrast  Result Date: 04/21/2022 CLINICAL DATA:  Rectal bleeding x3 EXAM: CTA ABDOMEN AND PELVIS WITHOUT  AND WITH CONTRAST TECHNIQUE: Multidetector CT imaging of the abdomen and pelvis was performed using the standard protocol during bolus administration of intravenous contrast. Multiplanar reconstructed images and MIPs were obtained and reviewed to evaluate the vascular anatomy. RADIATION DOSE REDUCTION: This exam was performed according to the departmental dose-optimization program which includes automated exposure control, adjustment of the mA and/or kV according to patient size and/or use of iterative reconstruction technique. CONTRAST:  199m OMNIPAQUE IOHEXOL 350 MG/ML SOLN COMPARISON:  10/17/2013 FINDINGS: VASCULAR Aorta: Mild calcified atheromatous plaque. No aneurysm, dissection, or stenosis. Celiac: Calcified ostial plaque without significant stenosis, patent distally. SMA: Calcified ostial plaque without significant stenosis, patent distally. Replaced right hepatic arterial supply, an anatomic variant. Renals: Duplicated left, posterior dominant, patent. Duplicated right, superior dominant, mildly atheromatous but patent. IMA: Patent without evidence of aneurysm, dissection, vasculitis or significant stenosis. Inflow: Left common iliac artery distal fusiform aneurysmal dilatation up to 2 cm diameter, right 1.6 cm. Tortuous iliac arterial systems without dissection or stenosis. Proximal Outflow: Minimally atheromatous, patent. Veins: Patent hepatic veins, portal vein, SMV, splenic vein, bilateral renal veins. Iliac venous system incompletely  opacified. IVC normal. Review of the MIP images confirms the above findings. NON-VASCULAR Lower chest: No pleural or pericardial effusion. Scattered coronary calcifications. Pulmonary emphysema. Hepatobiliary: Micronodular contour suggesting cirrhosis. No focal lesion or biliary ductal dilatation. Post cholecystectomy. Pancreas: Unremarkable. No pancreatic ductal dilatation or surrounding inflammatory changes. Spleen: Normal in size without focal abnormality.  Adrenals/Urinary Tract: No adrenal mass. Symmetric renal enhancement without focal lesion or hydronephrosis. No urolithiasis. The urinary bladder incompletely distended. Stomach/Bowel: No evidence of active GI bleed. Stomach and small bowel are nondistended. Appendix not identified. The colon is incompletely distended, with a few scattered sigmoid diverticula; no adjacent inflammatory change. Lymphatic: No abdominal or pelvic adenopathy. Reproductive: Mild prostate enlargement with central coarse calcifications. Other: No ascites.  No free air. Musculoskeletal: Mild vertebral compression deformities of T11 and L1, stable since 10/16/2021. Multilevel lumbar spondylitic change. No acute findings. IMPRESSION: 1. No active GI bleed or other acute vascular finding. 2. Sigmoid diverticulosis 3. 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 4. Coronary and Aortic Atherosclerosis (ICD10-170.0). 5. Nodular hepatic contour suggesting cirrhosis, without stigmata of portal venous hypertension. 6.  Emphysema (ICD10-J43.9). Electronically Signed   By: Lucrezia Europe M.D.   On: 04/21/2022 17:44     Scheduled Meds:  azithromycin  250 mg Oral Daily   citalopram  20 mg Oral BH-q7a   propranolol  10 mg Oral BID   temazepam  15 mg Oral QHS   Continuous Infusions:   LOS: 1 day    Time spent: 50 mins    Saleena Tamas, MD [facility]   If 7PM-7AM, please contact night-coverage

## 2022-04-23 NOTE — TOC Initial Note (Signed)
Transition of Care (TOC) - Initial/Assessment Note    Patient Details  Name: Billy Freeman. MRN: [id number] Date of Birth: Jan 24, 1951  Transition of Care [facility]) CM/SW Contact:    Beverly Sessions, RN Phone Number: 04/23/2022, 3:51 PM  Clinical Narrative:                  Transition of Care [facility]) Screening Note   Patient Details  Name: Billy Freeman. Date of Birth: 1951-05-18   Transition of Care [facility]) CM/SW Contact:    Beverly Sessions, RN Phone Number: 04/23/2022, 3:51 PM    Transition of Care Department [facility]) has reviewed patient and no TOC needs have been identified at this time. We will continue to monitor patient advancement through interdisciplinary progression rounds. If new patient transition needs arise, please place a TOC consult.          Patient Goals and CMS Choice        Expected Discharge Plan and Services                                                Prior Living Arrangements/Services                       Activities of Daily Living Home Assistive Devices/Equipment: None ADL Screening (condition at time of admission) Patient's cognitive ability adequate to safely complete daily activities?: Yes Is the patient deaf or have difficulty hearing?: No Does the patient have difficulty seeing, even when wearing glasses/contacts?: No Does the patient have difficulty concentrating, remembering, or making decisions?: No Patient able to express need for assistance with ADLs?: Yes Does the patient have difficulty dressing or bathing?: No Independently performs ADLs?: Yes (appropriate for developmental age) Does the patient have difficulty walking or climbing stairs?: No Weakness of Legs: None Weakness of Arms/Hands: None  Permission Sought/Granted                  Emotional Assessment              Admission diagnosis:  Hematochezia [K92.1] Lower GI bleed [K92.2] Patient Active Problem List   Diagnosis Date  Noted   Lower GI bleed 04/22/2022   Hematochezia 04/21/2022   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   Pulmonary hypertension, mild ([place]) 01/09/2022   OSA treated with BiPAP 12/31/2020   Hypertension 12/31/2020   Overweight (BMI 25.0-29.9) 12/31/2020   Actinic keratosis 01/03/2019   Decreased left ventricular function 01/03/2019   Senile hyperkeratosis 01/03/2019   Atrial fibrillation ([place]) 10/05/2018   Bilateral lower extremity edema 01/19/2018   Blue toes 01/19/2018   History of pulmonary embolism 07/14/2017   Personal history of gout 07/14/2017   Closed compression fracture of L1 vertebra ([place]) 07/15/2016   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   History of urinary stone 02/26/2015   Erectile dysfunction 06/07/2014   Hyperlipidemia 06/06/2014   COPD (chronic obstructive pulmonary disease) ([place]) 01/02/2014   Clinical depression 01/02/2014   Acid reflux 01/02/2014   Gout 01/02/2014   BP (high blood pressure) 01/02/2014   Intermittent tremor 01/02/2014   Obstructive apnea 01/02/2014   Acne erythematosa 01/02/2014   Abnormal involuntary movement 01/02/2014   Major depressive disorder, single episode 01/02/2014   Calculus of kidney 11/23/2013   Hydronephrosis 56/43/3295   Renal colic 18/84/1660   Calculi, ureter 11/23/2013   PCP:  Tracie Harrier, MD Pharmacy:   [id number] , [id number]: [phone]: [phone]    Social Determinants of Health (SDOH) Interventions    Readmission Risk Interventions     No data to display

## 2022-04-23 NOTE — Progress Notes (Signed)
Mobility Specialist - Progress Note   04/23/22 1437  Mobility  Activity Ambulated independently in hallway  Level of Assistance Independent  Assistive Device None  Distance Ambulated (ft) 300 ft  Activity Response Tolerated well  $Mobility charge 1 Mobility   Pt supine upon entry, utilizing RA. Pt ambulated one lap around [place] and one lap around 2B. Pt left supine with needs within reach, no complaints.   Candie Mile Mobility Specialist 04/23/22 2:40 PM

## 2022-04-23 NOTE — Progress Notes (Signed)
Patient denied pain upon assessment. Did ask for me to check with the on call to see if his symbicort inhaler could be ordered. Pharmacy here only carries Dulera-pt refused. Informed patient that he could bring his own inhaler from home but patient stated that he would not be here long enough to bring his own. Reported that the bloody stools have become only smears of blood now. Denied additional needs.

## 2022-04-23 NOTE — Plan of Care (Signed)

## 2022-04-23 NOTE — Progress Notes (Signed)
GI Inpatient Follow-up Note  Subjective:  Patient seen and doing well. No significant GI bleeding. A small amount when he wipes.  Scheduled Inpatient Medications:   azithromycin  250 mg Oral Daily   citalopram  20 mg Oral BH-q7a   propranolol  10 mg Oral BID   temazepam  15 mg Oral QHS    Continuous Inpatient Infusions:    PRN Inpatient Medications:  acetaminophen **OR** acetaminophen, HYDROcodone-acetaminophen, morphine injection, ondansetron **OR** ondansetron (ZOFRAN) IV  Review of Systems:  Review of Systems  Constitutional:  Negative for chills and fever.  Respiratory:  Negative for shortness of breath.   Cardiovascular:  Negative for chest pain.  Gastrointestinal:  Negative for abdominal pain, constipation, diarrhea, melena, nausea and vomiting.  Musculoskeletal:  Negative for joint pain.  Skin:  Negative for rash.  Neurological:  Negative for focal weakness.  All other systems reviewed and are negative.     Physical Examination: BP 115/66 (BP Location: Right Arm)   Pulse 76   Temp 98.3 F (36.8 C) (Oral)   Resp 18   Ht '6\' 4"'$  (1.93 m)   Wt 98.9 kg   SpO2 96%   BMI 26.54 kg/m  Gen: NAD, alert and oriented x 4 HEENT: PEERLA, EOMI, Neck: supple, no JVD or thyromegaly Chest: No respiratory distress Abd: soft, non-tender, non-distended Ext: no edema Skin: no rash or lesions noted Lymph: no lymphadenopathy  Data: Lab Results  Component Value Date   WBC 8.4 04/21/2022   HGB 14.0 04/22/2022   HCT 46.8 04/21/2022   MCV 98.0 04/21/2022   PLT 213 04/21/2022   Recent Labs  Lab 04/21/22 1127 04/21/22 1621 04/22/22 0419  HGB 16.4 15.9 14.0   Lab Results  Component Value Date   NA 138 04/21/2022   K 4.3 04/21/2022   CL 107 04/21/2022   CO2 24 04/21/2022   BUN 26 (H) 04/21/2022   CREATININE 1.00 04/21/2022   Lab Results  Component Value Date   ALT 34 04/21/2022   AST 38 04/21/2022   ALKPHOS 112 04/21/2022   BILITOT 1.9 (H) 04/21/2022   Recent  Labs  Lab 04/21/22 1621  APTT 34  INR 1.3*   Assessment/Plan: [name] is a 71 y.o. gentleman with likely diverticular bleed that has resolved  Recommendations:  - advance diet - monitor for any significant GI bleeding - continue to hold eliquis, can restart 3 days after discharge - if tolerates diet and has a good bowel movement without significant blood then can likely be discharged later this evening vs tomorrow  Please call with any questions or concerns  Raylene Miyamoto MD, MPH [place]

## 2022-04-24 DIAGNOSIS — K921 Melena: Secondary | ICD-10-CM | POA: Diagnosis not present

## 2022-04-24 LAB — HEMOGLOBIN AND HEMATOCRIT, BLOOD
HCT: 43.6 % (ref 39.0–52.0)
Hemoglobin: 15 g/dL (ref 13.0–17.0)

## 2022-04-24 MED ORDER — POLYETHYLENE GLYCOL 3350 17 G PO PACK
17.0000 g | PACK | Freq: Every day | ORAL | Status: DC
  Administered 2022-04-24: 17 g via ORAL
  Filled 2022-04-24: qty 1

## 2022-04-24 MED ORDER — POLYETHYLENE GLYCOL 3350 17 G PO PACK: 17.0000 g | PACK | Freq: Every day | ORAL | 0 refills | Status: DC

## 2022-04-24 NOTE — Discharge Instructions (Signed)
Advised to follow-up with primary care physician in 1 week. Advised to follow-up with outpatient gastroenterology for further work-up. Advised to resume Eliquis after 3 days after discharge. Advised to return if he has worsening blood in the stools.

## 2022-04-24 NOTE — Progress Notes (Signed)
Mobility Specialist - Progress Note   04/24/22 1104  Mobility  Activity Ambulated independently in hallway  Level of Assistance Independent  Assistive Device None  Distance Ambulated (ft) 300 ft  Activity Response Tolerated well  $Mobility charge 1 Mobility   Pt supine upon entry, utilizing RA. Pt ambulated one lap around [place] and one lap around 2B. Pt left sitting up in bed with needs within reach, RN present at bedside. No complaints.   Candie Mile Mobility Specialist 04/24/22 11:06 AM

## 2022-04-24 NOTE — Discharge Summary (Signed)
Physician Discharge Summary  Billy Freeman. TML:[id number] DOB: 1951/01/20 DOA: 04/21/2022  PCP: Tracie Harrier, MD  Admit date: 04/21/2022  Discharge date: 04/24/2022  Admitted From: Home. Disposition:  Home.  Recommendations for Outpatient Follow-up:  Follow up with PCP in 1-2 weeks. Please obtain BMP/CBC in one week. Advised to follow-up with outpatient gastroenterology for further work-up. Advised to resume Eliquis after 3 days after discharge. Advised to return if he has worsening blood in the stools.  Home Health:None Equipment/Devices:None  Discharge Condition: Stable CODE STATUS:Full code Diet recommendation: Heart Healthy  Brief [facility] Course This 71 y.o. male with medical history significant for COPD, HTN, OSA on BiPAP, pulmonary hypertension, past history of PE, diagnosed with atrial fibrillation in March 2023 when he was started on Eliquis, who presents to the ED for evaluation of rectal bleeding.  Patient reports that he felt like he was having diarrhea and when he wiped he saw blood.  He went on to have several more episodes, up to 6, with the last one being blood without stool. He has  abdominal cramping associated with the bowel movements but denies pain. 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 Colonoscopy in February 2023 that was positive for multiple diverticula and nonbleeding grade 1 internal hemorrhoids. EGD at the same time showed duodenitis and gastritis.  Patient was  admitted for further evaluation.  Gastroenterology is consulted.  Patient was managed with supportive care, hemoglobin remained stable.  Patient was started on clear liquid diet and advance to soft bland he tolerated well he had a bowel movement which is still shows dark blood.  Discussed with GI recommended this is likely diverticular bleed patient can be discharged and  patient can follow-up with GI outpatient.  Patient is being discharged home  Discharge Diagnoses:  Principal Problem:   Hematochezia Active Problems:   COPD (chronic obstructive pulmonary disease) (HCC)   Atrial fibrillation (HCC)   History of pulmonary embolism   OSA treated with BiPAP   Hypertension   Pulmonary hypertension, mild (HCC)   Lower GI bleed  Hematochezia: Patient reports several episodes of hematochezia. Colonoscopy February 2023 positive for multiple diverticula and nonbleeding grade 1 internal hemorrhoids H&H remains stable. GI Dr. Haig Prophet consulted.   Advised clear liquid diet and continue to observe. Patient still reports having intermittent bleeding. Diet advanced to soft, has tolerated well.  Patient has a big bowel movement. H&H remains stable.  Patient is discharged as per GI.   Pulmonary hypertension: Stable.  No acute issues.   Essential hypertension: Blood pressure controlled.  Continue propranolol.   OSA on BiPAP Continue nighttime BiPAP   Atrial fibrillation: Hold Eliquis. Continue propranolol   COPD: Not in any acute exacerbation. Continue home inhalers DuoNebs as needed  Discharge Instructions  Discharge Instructions     Call MD for:  difficulty breathing, headache or visual disturbances   Complete by: As directed    Call MD for:  persistant dizziness or light-headedness   Complete by: As directed    Call MD for:  persistant nausea and vomiting   Complete by: As directed    Diet Carb Modified   Complete by: As directed    Discharge instructions   Complete by: As directed    Advised to follow-up with primary care physician in 1 week. Advised to follow-up with outpatient gastroenterology for further work-up. Advised to resume Eliquis after 3  days after discharge. Advised to return if he has worsening blood in the stools.   Increase activity slowly   Complete by: As directed       Allergies as of 04/24/2022       Reactions    Oxycodone Itching        Medication List     STOP taking these medications    celecoxib 200 MG capsule Commonly known as: CELEBREX   Cialis 5 MG tablet Generic drug: tadalafil   diazepam 5 MG tablet Commonly known as: Valium   gabapentin 100 MG capsule Commonly known as: NEURONTIN   lidocaine 5 % Commonly known as: LIDODERM   oxyCODONE 5 MG immediate release tablet Commonly known as: Oxy IR/ROXICODONE       TAKE these medications    Acetaminophen Extra Strength 500 MG Tabs Take 2 tablets by mouth every 8 (eight) hours as needed.   allopurinol 100 MG tablet Commonly known as: ZYLOPRIM Take 1 tablet by mouth daily. What changed: Another medication with the same name was removed. Continue taking this medication, and follow the directions you see here.   apixaban 5 MG Tabs tablet Commonly known as: ELIQUIS Take 1 tablet (5 mg total) by mouth 2 (two) times daily. Take 10 mg (two pills) twice daily for seven days, then take '5mg'$  (one pill) twice daily. What changed: additional instructions   azithromycin 250 MG tablet Commonly known as: ZITHROMAX Take 250 mg by mouth daily.   budesonide-formoterol 160-4.5 MCG/ACT inhaler Commonly known as: SYMBICORT Inhale 2 puffs into the lungs 2 (two) times daily.   citalopram 20 MG tablet Commonly known as: CELEXA Take 20 mg by mouth every morning.   clindamycin 1 % external solution Commonly known as: CLEOCIN T Apply 1 application topically 2 (two) times daily as needed (for breakout).   Fish Oil 1200 MG Caps Take 1,200 mg by mouth 2 (two) times daily.   polyethylene glycol 17 g packet Commonly known as: MIRALAX / GLYCOLAX Take 17 g by mouth daily. Start taking on: April 25, 2022   propranolol 10 MG tablet Commonly known as: INDERAL Take 1 tablet by mouth 2 (two) times daily.   temazepam 15 MG capsule Commonly known as: RESTORIL Take 15 mg by mouth at bedtime. What changed: Another medication with the  same name was removed. Continue taking this medication, and follow the directions you see here.   vitamin B-12 500 MCG tablet Commonly known as: CYANOCOBALAMIN Take 500 mcg by mouth daily.   Vitamin D 50 MCG (2000 UT) tablet Take 4,000 Units by mouth daily.        Follow-up Information     Tracie Harrier, MD Follow up in 1 week(s).   Specialty: Internal Medicine Contact information: [place] [address] [phone]         Lesly Rubenstein, MD Follow up in 2 week(s).   Specialty: Gastroenterology Contact information: [place] [address] [phone]                Allergies  Allergen Reactions   Oxycodone Itching    Consultations: Gastroenterology   Procedures/Studies: CT Angio Abd/Pel W and/or Wo Contrast  Result Date: 04/21/2022 CLINICAL DATA:  Rectal bleeding x3 EXAM: CTA ABDOMEN AND PELVIS WITHOUT AND WITH CONTRAST TECHNIQUE: Multidetector CT imaging of the abdomen and pelvis was performed using the standard protocol during bolus administration of intravenous contrast. Multiplanar reconstructed images and MIPs were obtained and reviewed to evaluate  the vascular anatomy. RADIATION DOSE REDUCTION: This exam was performed according to the departmental dose-optimization program which includes automated exposure control, adjustment of the mA and/or kV according to patient size and/or use of iterative reconstruction technique. CONTRAST:  139m OMNIPAQUE IOHEXOL 350 MG/ML SOLN COMPARISON:  10/17/2013 FINDINGS: VASCULAR Aorta: Mild calcified atheromatous plaque. No aneurysm, dissection, or stenosis. Celiac: Calcified ostial plaque without significant stenosis, patent distally. SMA: Calcified ostial plaque without significant stenosis, patent distally. Replaced right hepatic arterial supply, an anatomic variant. Renals: Duplicated left, posterior dominant, patent. Duplicated right, superior dominant, mildly atheromatous  but patent. IMA: Patent without evidence of aneurysm, dissection, vasculitis or significant stenosis. Inflow: Left common iliac artery distal fusiform aneurysmal dilatation up to 2 cm diameter, right 1.6 cm. Tortuous iliac arterial systems without dissection or stenosis. Proximal Outflow: Minimally atheromatous, patent. Veins: Patent hepatic veins, portal vein, SMV, splenic vein, bilateral renal veins. Iliac venous system incompletely opacified. IVC normal. Review of the MIP images confirms the above findings. NON-VASCULAR Lower chest: No pleural or pericardial effusion. Scattered coronary calcifications. Pulmonary emphysema. Hepatobiliary: Micronodular contour suggesting cirrhosis. No focal lesion or biliary ductal dilatation. Post cholecystectomy. Pancreas: Unremarkable. No pancreatic ductal dilatation or surrounding inflammatory changes. Spleen: Normal in size without focal abnormality. Adrenals/Urinary Tract: No adrenal mass. Symmetric renal enhancement without focal lesion or hydronephrosis. No urolithiasis. The urinary bladder incompletely distended. Stomach/Bowel: No evidence of active GI bleed. Stomach and small bowel are nondistended. Appendix not identified. The colon is incompletely distended, with a few scattered sigmoid diverticula; no adjacent inflammatory change. Lymphatic: No abdominal or pelvic adenopathy. Reproductive: Mild prostate enlargement with central coarse calcifications. Other: No ascites.  No free air. Musculoskeletal: Mild vertebral compression deformities of T11 and L1, stable since 10/16/2021. Multilevel lumbar spondylitic change. No acute findings. IMPRESSION: 1. No active GI bleed or other acute vascular finding. 2. Sigmoid diverticulosis 3. 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 4. Coronary and Aortic Atherosclerosis (ICD10-170.0). 5. Nodular hepatic contour suggesting cirrhosis, without stigmata of portal venous hypertension. 6.  Emphysema (ICD10-J43.9). Electronically  Signed   By: DLucrezia EuropeM.D.   On: 04/21/2022 17:44      Subjective: Patient was seen and examined at bedside.  Overnight events noted.  Patient reports feeling better, he has a bowel movement but has some darkish stool.  Reports feeling better. H&H remains stable . has tolerated soft diet.  Patient wants to be discharged.  Discharge Exam: Vitals:   04/24/22 0303 04/24/22 0720  BP: 134/79 131/87  Pulse: 60 63  Resp: 18 20  Temp: 98 F (36.7 C) 98.7 F (37.1 C)  SpO2: 100% 97%   Vitals:   04/23/22 1443 04/23/22 2022 04/24/22 0303 04/24/22 0720  BP: 115/66 122/88 134/79 131/87  Pulse: 76 77 60 63  Resp: '18 18 18 20  '$ Temp: 98.3 F (36.8 C) 98 F (36.7 C) 98 F (36.7 C) 98.7 F (37.1 C)  TempSrc: Oral   Oral  SpO2: 96% 96% 100% 97%  Weight:      Height:        General: Pt is alert, awake, not in acute distress Cardiovascular: RRR, S1/S2 +, no rubs, no gallops Respiratory: CTA bilaterally, no wheezing, no rhonchi Abdominal: Soft, NT, ND, bowel sounds + Extremities: no edema, no cyanosis    The results of significant diagnostics from this hospitalization (including imaging, microbiology, ancillary and laboratory) are listed below for reference.     Microbiology: No results found for this or any previous visit (  from the past 240 hour(s)).   Labs: BNP (last 3 results) No results for input(s): "BNP" in the last 8760 hours. Basic Metabolic Panel: Recent Labs  Lab 04/21/22 1127  NA 138  K 4.3  CL 107  CO2 24  GLUCOSE 102*  BUN 26*  CREATININE 1.00  CALCIUM 9.0   Liver Function Tests: Recent Labs  Lab 04/21/22 1127  AST 38  ALT 34  ALKPHOS 112  BILITOT 1.9*  PROT 6.9  ALBUMIN 3.7   No results for input(s): "LIPASE", "AMYLASE" in the last 168 hours. No results for input(s): "AMMONIA" in the last 168 hours. CBC: Recent Labs  Lab 04/21/22 1127 04/21/22 1621 04/22/22 0419 04/24/22 0357  WBC 8.4  --   --   --   HGB 16.4 15.9 14.0 15.0  HCT 48.6  46.8  --  43.6  MCV 98.0  --   --   --   PLT 213  --   --   --    Cardiac Enzymes: No results for input(s): "CKTOTAL", "CKMB", "CKMBINDEX", "TROPONINI" in the last 168 hours. BNP: Invalid input(s): "POCBNP" CBG: No results for input(s): "GLUCAP" in the last 168 hours. D-Dimer No results for input(s): "DDIMER" in the last 72 hours. Hgb A1c No results for input(s): "HGBA1C" in the last 72 hours. Lipid Profile No results for input(s): "CHOL", "HDL", "LDLCALC", "TRIG", "CHOLHDL", "LDLDIRECT" in the last 72 hours. Thyroid function studies No results for input(s): "TSH", "T4TOTAL", "T3FREE", "THYROIDAB" in the last 72 hours.  Invalid input(s): "FREET3" Anemia work up No results for input(s): "VITAMINB12", "FOLATE", "FERRITIN", "TIBC", "IRON", "RETICCTPCT" in the last 72 hours. Urinalysis    Component Value Date/Time   COLORURINE YELLOW (A) 06/07/2020 2210   APPEARANCEUR CLEAR (A) 06/07/2020 2210   APPEARANCEUR Clear 10/17/2013 0057   LABSPEC 1.021 06/07/2020 2210   LABSPEC 1.033 10/17/2013 0057   PHURINE 5.0 06/07/2020 2210   GLUCOSEU NEGATIVE 06/07/2020 2210   GLUCOSEU Negative 10/17/2013 0057   HGBUR NEGATIVE 06/07/2020 2210   BILIRUBINUR NEGATIVE 06/07/2020 2210   BILIRUBINUR Negative 10/17/2013 0057   KETONESUR NEGATIVE 06/07/2020 2210   PROTEINUR NEGATIVE 06/07/2020 2210   NITRITE NEGATIVE 06/07/2020 2210   LEUKOCYTESUR NEGATIVE 06/07/2020 2210   LEUKOCYTESUR Negative 10/17/2013 0057   Sepsis Labs Recent Labs  Lab 04/21/22 1127  WBC 8.4   Microbiology No results found for this or any previous visit (from the past 240 hour(s)).   Time coordinating discharge: Over 30 minutes  SIGNED:   Shawna Clamp, MD  [facility] 04/24/2022, 11:36 AM Pager   If 7PM-7AM, please contact night-coverage

## 2022-05-19 DIAGNOSIS — E8801 Alpha-1-antitrypsin deficiency: Secondary | ICD-10-CM | POA: Insufficient documentation

## 2022-05-19 DIAGNOSIS — I5020 Unspecified systolic (congestive) heart failure: Secondary | ICD-10-CM | POA: Insufficient documentation

## 2022-05-26 DIAGNOSIS — I4729 Other ventricular tachycardia: Secondary | ICD-10-CM | POA: Insufficient documentation

## 2022-05-26 DIAGNOSIS — S81801A Unspecified open wound, right lower leg, initial encounter: Secondary | ICD-10-CM | POA: Insufficient documentation

## 2022-06-13 ENCOUNTER — Encounter: Payer: Medicare Other | Attending: Physician Assistant | Admitting: Physician Assistant

## 2022-06-13 DIAGNOSIS — L97812 Non-pressure chronic ulcer of other part of right lower leg with fat layer exposed: Secondary | ICD-10-CM | POA: Diagnosis not present

## 2022-06-13 DIAGNOSIS — I480 Paroxysmal atrial fibrillation: Secondary | ICD-10-CM | POA: Diagnosis not present

## 2022-06-13 DIAGNOSIS — S81811A Laceration without foreign body, right lower leg, initial encounter: Secondary | ICD-10-CM | POA: Insufficient documentation

## 2022-06-13 DIAGNOSIS — Z7901 Long term (current) use of anticoagulants: Secondary | ICD-10-CM | POA: Diagnosis not present

## 2022-06-13 DIAGNOSIS — I10 Essential (primary) hypertension: Secondary | ICD-10-CM | POA: Diagnosis not present

## 2022-06-13 DIAGNOSIS — J449 Chronic obstructive pulmonary disease, unspecified: Secondary | ICD-10-CM | POA: Diagnosis not present

## 2022-06-13 DIAGNOSIS — L97822 Non-pressure chronic ulcer of other part of left lower leg with fat layer exposed: Secondary | ICD-10-CM | POA: Insufficient documentation

## 2022-06-13 DIAGNOSIS — X58XXXA Exposure to other specified factors, initial encounter: Secondary | ICD-10-CM | POA: Diagnosis not present

## 2022-06-13 DIAGNOSIS — I87311 Chronic venous hypertension (idiopathic) with ulcer of right lower extremity: Secondary | ICD-10-CM | POA: Insufficient documentation

## 2022-06-13 NOTE — Progress Notes (Signed)
DEZ, STAUFFER ([id number]) [id number] Page 1 of 9 Visit Report for 06/13/2022 Chief Complaint Document Details Patient Name: Date of Service: Billy Freeman, Billy Freeman 06/13/2022 7:30 A M Medical Record Number: [id number] Patient Account Number: [id number] Date of Birth/Sex: Treating RN: 1951/01/04 (71 y.o. Male) Billy Freeman Primary Care Provider: Tracie Freeman Other Clinician: Referring Provider: Treating Provider/Extender: Billy [name] Freeman, Referral Weeks in Treatment: 0 Information Obtained from: Patient Chief Complaint Right LE Ulcer Electronic Signature(s) Signed: 06/13/2022 9:16:14 AM By: Billy Loud MSN RN CNS WTA Signed: 06/13/2022 2:32:55 PM By: Billy Keeler PA-C Previous Signature: 06/13/2022 8:47:50 AM Version By: Billy Keeler PA-C Previous Signature: 06/13/2022 8:26:20 AM Version By: Billy Keeler PA-C Entered By: Billy Freeman on 06/13/2022 09:16:13 -------------------------------------------------------------------------------- Debridement Details Patient Name: Date of Service: Billy Freeman, Billy Freeman 06/13/2022 7:30 A M Medical Record Number: [id number] Patient Account Number: [id number] Date of Birth/Sex: Treating RN: 1950/10/26 (71 y.o. Male) Billy Freeman Primary Care Provider: Tracie Freeman Other Clinician: Referring Provider: Treating Provider/Extender: Billy [name] Freeman, Referral Weeks in Treatment: 0 Debridement Performed for Assessment: Wound #1 Right,Medial Lower Leg Performed By: Physician Billy Sams., PA-C Debridement Type: Debridement Severity of Tissue Pre Debridement: Fat layer exposed Level of Consciousness (Pre-procedure): Awake and Alert Pre-procedure Verification/Time Out Yes - 08:36 Taken: Start Time: 08:36 T Area Debrided (L x W): otal 1.8 (cm) x 1.7 (cm) = 3.06 (cm) Tissue and other material debrided: Slough, Subcutaneous, Slough Level: Skin/Subcutaneous Tissue Debridement Description: Excisional Instrument:  Curette Bleeding: Minimum Hemostasis Achieved: Pressure Response to Treatment: Procedure was tolerated well Level of Consciousness Billy Freeman, Billy Freeman ([id number]) [id number] Page 2 of 9 Level of Consciousness (Post- Awake and Alert procedure): Post Debridement Measurements of Total Wound Length: (cm) 1.6 Width: (cm) 1.7 Depth: (cm) 0.4 Volume: (cm) 0.855 Character of Wound/Ulcer Post Debridement: Stable Severity of Tissue Post Debridement: Fat layer exposed Post Procedure Diagnosis Same as Pre-procedure Electronic Signature(s) Signed: 06/13/2022 1:24:40 PM By: Billy Loud MSN RN CNS WTA Signed: 06/13/2022 2:32:55 PM By: Billy Keeler PA-C Entered By: Billy Freeman on 06/13/2022 08:39:55 -------------------------------------------------------------------------------- HPI Details Patient Name: Date of Service: Billy Freeman 06/13/2022 7:30 A M Medical Record Number: [id number] Patient Account Number: [id number] Date of Birth/Sex: Treating RN: 04-07-1951 (71 y.o. Male) Billy Freeman Primary Care Provider: Tracie Freeman Other Clinician: Referring Provider: Treating Provider/Extender: Billy [name] Freeman, Referral Weeks in Treatment: 0 History of Present Illness HPI Description: 06-13-2022 upon evaluation today patient appears to be doing poorly currently in regard to the wound on his leg. He subsequently tells me that he hit this on a piece of wood when he was putting something together this was around December 7. Subsequently he states that since that time he has been having issues with getting it to heal at 1 point he believes it was likely infected it was hurting quite severely. He ended up actually switching to bacitracin ointment at that point he tells me a lot of the severe pain improved following. With that being said he tells me that with what we are seeing here currently that he was still having a lot of issues with getting this to close and  therefore wanted to come into the wound care center as he has had a good experience with his mother before going to the wound care center in [place] where they were able to get the wound healed that otherwise had been recommended may require amputation. He has been in the hospital for his heart and tells me that during that  time he had been previously that using Neosporin and a Band-Aid. In the hospital he was told to use Medihoney and a silver alginate to cover. With that being said he tells me that it really has not helped tremendously he eventually went to using the bacitracin which has been more recent. Patient does have a history of what appears to be venous insufficiency of the lower extremities he tells me has been trying to elevate his legs much as possible. He also has a history of atrial fibrillation, long-term use of anticoagulant therapy due to this he is on Eliquis, he does have hypertension, and COPD. He is also more recently been in the hospital for his heart he has a heart monitor on even now. Electronic Signature(s) Signed: 06/13/2022 8:49:02 AM By: Billy Keeler PA-C Entered By: Billy Freeman on 06/13/2022 Billy Freeman, Billy Freeman ([id number]) [id number] Page 3 of 9 -------------------------------------------------------------------------------- Physical Exam Details Patient Name: Date of Service: Billy Freeman, Billy Freeman 06/13/2022 7:30 A M Medical Record Number: [id number] Patient Account Number: [id number] Date of Birth/Sex: Treating RN: 1950-11-07 (71 y.o. Male) Billy Freeman Primary Care Provider: Tracie Freeman Other Clinician: Referring Provider: Treating Provider/Extender: Billy [name] Freeman, Referral Weeks in Treatment: 0 Constitutional sitting or standing blood pressure is within target range for patient.. pulse regular and within target range for patient.Marland Kitchen respirations regular, non-labored and within target range for patient.Marland Kitchen temperature  within target range for patient.. Well-nourished and well-hydrated in no acute distress. Eyes conjunctiva clear no eyelid edema noted. pupils equal round and reactive to light and accommodation. Ears, Nose, Mouth, and Throat no gross abnormality of ear auricles or external auditory canals. normal hearing noted during conversation. mucus membranes moist. Respiratory normal breathing without difficulty. Cardiovascular 2+ dorsalis pedis/posterior tibialis pulses. 1+ pitting edema of the bilateral lower extremities. Musculoskeletal normal gait and posture. no significant deformity or arthritic changes, no loss or range of motion, no clubbing. Psychiatric this patient is able to make decisions and demonstrates good insight into disease process. Alert and Oriented x 3. pleasant and cooperative. Notes Upon inspection patient's wound bed actually showed signs of some necrotic tissue noted on the surface of the wound. I do believe that he may benefit from a collagen-based dressing. The one caveat here is that we will get a have some issues with ensuring that he has appropriate follow-up due to the fact that he actually will be leaving to go out of town on Wednesday. That means this will actually be the last visit that I see him for and is also the first visit. I explained to him that sometimes dressings need to be changed as far as the type of dressing used in the beginning until we get into a stable path but nonetheless him and given our best shot near and try to see what we can do to get things under control. Ideally I would have like to pad him in a compression wrap but that does not look like it is going to happen. Simply due to the fact that he is headed out of town. Electronic Signature(s) Signed: 06/13/2022 8:50:00 AM By: Billy Keeler PA-C Entered By: Billy Freeman on 06/13/2022 08:49:59 -------------------------------------------------------------------------------- Physician Orders  Details Patient Name: Date of Service: Billy Freeman, Billy Freeman 06/13/2022 7:30 A M Medical Record Number: [id number] Patient Account Number: [id number] Date of Birth/Sex: Treating RN: [birth year] (71 y.o. Male) Billy Freeman Primary Care Provider: Tracie Freeman Other Clinician: Referring Provider: Treating Provider/Extender: Billy [name] Freeman, Referral Weeks in Treatment: 0 Verbal /  Phone Orders: No Diagnosis Coding ICD-10 Coding Code Description T73.220U Laceration without foreign body, right lower leg, initial encounter L97.822 Non-pressure chronic ulcer of other part of left lower leg with fat layer exposed I48.0 Paroxysmal atrial fibrillation Billy Freeman, Billy Freeman ([id number]) [id number] Page 4 of 9 Z79.01 Long term (current) use of anticoagulants I10 Essential (primary) hypertension J44.9 Chronic obstructive pulmonary disease, unspecified Bathing/ Shower/ Hygiene Wash wounds with antibacterial soap and water. - Wash with Vashe. If burns too bad then use antibacterial soap Anesthetic (Use 'Patient Medications' Section for Anesthetic Order Entry) Lidocaine applied to wound bed Wound Treatment Wound #1 - Lower Leg Wound Laterality: Right, Medial Cleanser: Byram Ancillary Kit - 15 Day Supply (DME) (Generic) 3 x Per Week/30 Days Discharge Instructions: Use supplies as instructed; Kit contains: (15) Saline Bullets; (15) 3x3 Gauze; 15 pr Gloves Prim Dressing: Prisma 4.34 (in) (DME) (Dispense As Written) 3 x Per Week/30 Days ary Discharge Instructions: Moisten w/normal saline or sterile water; Cover wound as directed. Do not remove from wound bed. Secondary Dressing: ABD Pad 5x9 (in/in) (DME) (Generic) 3 x Per Week/30 Days Discharge Instructions: Cover with ABD pad Secured With: Kerlix Roll Sterile or Non-Sterile 6-ply 4.5x4 (yd/yd) (DME) (Generic) 3 x Per Week/30 Days Discharge Instructions: Apply Kerlix as directed Electronic Signature(s) Signed: 06/16/2022 1:21:53 PM  By: Billy Loud MSN RN CNS WTA Signed: 06/17/2022 8:14:16 AM By: Billy Keeler PA-C Previous Signature: 06/13/2022 9:14:30 AM Version By: Billy Loud MSN RN CNS WTA Previous Signature: 06/13/2022 2:32:55 PM Version By: Billy Keeler PA-C Entered By: Billy Freeman on 06/16/2022 13:21:53 -------------------------------------------------------------------------------- Problem List Details Patient Name: Date of Service: Billy Freeman, Billy Freeman 06/13/2022 7:30 A M Medical Record Number: [id number] Patient Account Number: [id number] Date of Birth/Sex: Treating RN: [birth year] (71 y.o. Male) Billy Freeman Primary Care Provider: Tracie Freeman Other Clinician: Referring Provider: Treating Provider/Extender: Billy [name] Freeman, Referral Weeks in Treatment: 0 Active Problems ICD-10 Encounter Code Description Active Date MDM Diagnosis I87.311 Chronic venous hypertension (idiopathic) with ulcer of right lower extremity 06/13/2022 No Yes S81.811A Laceration without foreign body, right lower leg, initial encounter 06/13/2022 No Yes L97.812 Non-pressure chronic ulcer of other part of right lower leg with fat layer 06/13/2022 No Yes exposed I48.0 Paroxysmal atrial fibrillation 06/13/2022 No Yes VASH, QUEZADA ([id number]) [id number] Page 5 of 9 Z79.01 Long term (current) use of anticoagulants 06/13/2022 No Yes I10 Essential (primary) hypertension 06/13/2022 No Yes J44.9 Chronic obstructive pulmonary disease, unspecified 06/13/2022 No Yes Inactive Problems Resolved Problems Electronic Signature(s) Signed: 06/13/2022 9:15:58 AM By: Billy Loud MSN RN CNS WTA Signed: 06/13/2022 2:32:55 PM By: Billy Keeler PA-C Previous Signature: 06/13/2022 8:47:19 AM Version By: Billy Keeler PA-C Previous Signature: 06/13/2022 8:26:06 AM Version By: Billy Keeler PA-C Entered By: Billy Freeman on 06/13/2022  09:15:58 -------------------------------------------------------------------------------- Progress Note Details Patient Name: Date of Service: Billy Freeman, Billy Freeman 06/13/2022 7:30 A M Medical Record Number: [id number] Patient Account Number: [id number] Date of Birth/Sex: Treating RN: 09/10/1950 (71 y.o. Male) Billy Freeman Primary Care Provider: Tracie Freeman Other Clinician: Referring Provider: Treating Provider/Extender: Billy [name] Freeman, Referral Weeks in Treatment: 0 Subjective Chief Complaint Information obtained from Patient Right LE Ulcer History of Present Illness (HPI) 06-13-2022 upon evaluation today patient appears to be doing poorly currently in regard to the wound on his leg. He subsequently tells me that he hit this on a piece of wood when he was putting something together this was around December 7. Subsequently he states that since that time he has been having  issues with getting it to heal at 1 point he believes it was likely infected it was hurting quite severely. He ended up actually switching to bacitracin ointment at that point he tells me a lot of the severe pain improved following. With that being said he tells me that with what we are seeing here currently that he was still having a lot of issues with getting this to close and therefore wanted to come into the wound care center as he has had a good experience with his mother before going to the wound care center in [place] where they were able to get the wound healed that otherwise had been recommended may require amputation. He has been in the hospital for his heart and tells me that during that time he had been previously that using Neosporin and a Band-Aid. In the hospital he was told to use Medihoney and a silver alginate to cover. With that being said he tells me that it really has not helped tremendously he eventually went to using the bacitracin which has been more recent. Patient does have a history of what  appears to be venous insufficiency of the lower extremities he tells me has been trying to elevate his legs much as possible. He also has a history of atrial fibrillation, long-term use of anticoagulant therapy due to this he is on Eliquis, he does have hypertension, and COPD. He is also more recently been in the hospital for his heart he has a heart monitor on even now. Patient History Information obtained from Patient. Allergies oxycodone (Severity: Mild, Reaction: Hives) Social History Never smoker, Marital Status - Married, Alcohol Use - Never, Drug Use - No History, Caffeine Use - Rarely. Billy Freeman, Billy Freeman ([id number]) [id number] Page 6 of 9 Medical History Respiratory Patient has history of Chronic Obstructive Pulmonary Disease (COPD) Cardiovascular Patient has history of Hypertension Endocrine Denies history of Type I Diabetes, Type II Diabetes Medical A Surgical History Notes nd Cardiovascular Afib Objective Constitutional sitting or standing blood pressure is within target range for patient.. pulse regular and within target range for patient.Marland Kitchen respirations regular, non-labored and within target range for patient.Marland Kitchen temperature within target range for patient.. Well-nourished and well-hydrated in no acute distress. Vitals Time Taken: 7:46 AM, Height: 76 in, Source: Stated, Weight: 208 lbs, Source: Stated, BMI: 25.3, Temperature: 96.9 F, Pulse: 96 bpm, Respiratory Rate: 16 breaths/min, Blood Pressure: 121/75 mmHg. Eyes conjunctiva clear no eyelid edema noted. pupils equal round and reactive to light and accommodation. Ears, Nose, Mouth, and Throat no gross abnormality of ear auricles or external auditory canals. normal hearing noted during conversation. mucus membranes moist. Respiratory normal breathing without difficulty. Cardiovascular 2+ dorsalis pedis/posterior tibialis pulses. 1+ pitting edema of the bilateral lower  extremities. Musculoskeletal normal gait and posture. no significant deformity or arthritic changes, no loss or range of motion, no clubbing. Psychiatric this patient is able to make decisions and demonstrates good insight into disease process. Alert and Oriented x 3. pleasant and cooperative. General Notes: Upon inspection patient's wound bed actually showed signs of some necrotic tissue noted on the surface of the wound. I do believe that he may benefit from a collagen-based dressing. The one caveat here is that we will get a have some issues with ensuring that he has appropriate follow-up due to the fact that he actually will be leaving to go out of town on Wednesday. That means this will actually be the last visit that I see him for and is also the first  visit. I explained to him that sometimes dressings need to be changed as far as the type of dressing used in the beginning until we get into a stable path but nonetheless him and given our best shot near and try to see what we can do to get things under control. Ideally I would have like to pad him in a compression wrap but that does not look like it is going to happen. Simply due to the fact that he is headed out of town. Integumentary (Hair, Skin) Wound #1 status is Open. Original cause of wound was Laceration. The date acquired was: 05/03/2022. The wound is located on the Right,Medial Lower Leg. The wound measures 1.6cm length x 1.7cm width x 0.3cm depth; 2.136cm^2 area and 0.641cm^3 volume. There is Fat Layer (Subcutaneous Tissue) exposed. There is no tunneling or undermining noted. There is a large amount of serosanguineous drainage noted. There is small (1-33%) red granulation within the wound bed. There is a small (1-33%) amount of necrotic tissue within the wound bed including Adherent Slough. Assessment Active Problems ICD-10 Chronic venous hypertension (idiopathic) with ulcer of right lower extremity Laceration without foreign body,  right lower leg, initial encounter Non-pressure chronic ulcer of other part of right lower leg with fat layer exposed Paroxysmal atrial fibrillation Long term (current) use of anticoagulants Essential (primary) hypertension Chronic obstructive pulmonary disease, unspecified Procedures Billy Freeman, Billy Freeman ([id number]) [id number] Page 7 of 9 Wound #1 Pre-procedure diagnosis of Wound #1 is a Venous Leg Ulcer located on the Right,Medial Lower Leg .Severity of Tissue Pre Debridement is: Fat layer exposed. There was a Excisional Skin/Subcutaneous Tissue Debridement with a total area of 3.06 sq cm performed by Billy Sams., PA-C. With the following instrument(s): Curette Material removed includes Subcutaneous Tissue and Slough and. No specimens were taken. A time out was conducted at 08:36, prior to the start of the procedure. A Minimum amount of bleeding was controlled with Pressure. The procedure was tolerated well. Post Debridement Measurements: 1.6cm length x 1.7cm width x 0.4cm depth; 0.855cm^3 volume. Character of Wound/Ulcer Post Debridement is stable. Severity of Tissue Post Debridement is: Fat layer exposed. Post procedure Diagnosis Wound #1: Same as Pre-Procedure Plan Bathing/ Shower/ Hygiene: Wash wounds with antibacterial soap and water. Anesthetic (Use 'Patient Medications' Section for Anesthetic Order Entry): Lidocaine applied to wound bed WOUND #1: - Lower Leg Wound Laterality: Right, Medial Cleanser: Byram Ancillary Kit - 15 Day Supply (DME) (Generic) 3 x Per Week/30 Days Discharge Instructions: Use supplies as instructed; Kit contains: (15) Saline Bullets; (15) 3x3 Gauze; 15 pr Gloves Prim Dressing: Prisma 4.34 (in) (DME) (Dispense As Written) 3 x Per Week/30 Days ary Discharge Instructions: Moisten w/normal saline or sterile water; Cover wound as directed. Do not remove from wound bed. Secondary Dressing: ABD Pad 5x9 (in/in) 3 x Per Week/30  Days Discharge Instructions: Cover with ABD pad Secured With: Kerlix Roll Sterile or Non-Sterile 6-ply 4.5x4 (yd/yd) 3 x Per Week/30 Days Discharge Instructions: Apply Kerlix as directed 1. I am going to recommend that we go ahead and use a silver collagen dressing which I think is probably going to be the best way to go and the patient is in agreement with that plan. 2. I am also going to recommend that we have the patient continue to monitor for any signs of worsening or infection he can use some of his bacitracin if need be he is on azithromycin long-term already for rosacea so hopefully that should help keep some of the infection  under control. 3. The Prisma that we are using also does have antimicrobial properties to it so hopefully that can be beneficial as well. 4. With regard to the ABD pad and roll gauze to secure I think that skin to be ideal over top of the Prisma that he will use Tubigrip size D double layer to help with edema control 20 get back and we can get him in a compression wrap. We will see patient back for reevaluation in 1 week here in the clinic. If anything worsens or changes patient will contact our office for additional recommendations. Electronic Signature(s) Signed: 06/13/2022 8:51:00 AM By: Billy Keeler PA-C Entered By: Billy Freeman on 06/13/2022 08:51:00 -------------------------------------------------------------------------------- ROS/PFSH Details Patient Name: Date of Service: Billy Freeman, Billy Freeman 06/13/2022 7:30 A M Medical Record Number: [id number] Patient Account Number: [id number] Date of Birth/Sex: Treating RN: 12-29-1950 (71 y.o. Male) Billy Freeman Primary Care Provider: Tracie Freeman Other Clinician: Referring Provider: Treating Provider/Extender: Billy [name] Freeman, Referral Weeks in Treatment: 0 Information Obtained From Patient Respiratory Medical History: Positive for: Chronic Obstructive Pulmonary Disease (COPD) Cardiovascular Medical  HistoryMarland Kitchen CORDE, ANTONINI ([id number]) [id number] Page 8 of 9 Positive for: Hypertension Past Medical History Notes: Afib Endocrine Medical History: Negative for: Type I Diabetes; Type II Diabetes Immunizations Pneumococcal Vaccine: Received Pneumococcal Vaccination: Yes Received Pneumococcal Vaccination On or After 60th Birthday: Yes Implantable Devices No devices added Family and Social History Never smoker; Marital Status - Married; Alcohol Use: Never; Drug Use: No History; Caffeine Use: Rarely Electronic Signature(s) Signed: 06/13/2022 1:24:40 PM By: Billy Loud MSN RN CNS WTA Signed: 06/13/2022 2:32:55 PM By: Billy Keeler PA-C Entered By: Billy Freeman on 06/13/2022 08:13:35 -------------------------------------------------------------------------------- SuperBill Details Patient Name: Date of Service: Billy Freeman, Billy Freeman 06/13/2022 Medical Record Number: [id number] Patient Account Number: [id number] Date of Birth/Sex: Treating RN: [birth year] (71 y.o. Male) Billy Freeman Primary Care Provider: Tracie Freeman Other Clinician: Referring Provider: Treating Provider/Extender: Billy [name] Freeman, Referral Weeks in Treatment: 0 Diagnosis Coding ICD-10 Codes Code Description I87.311 Chronic venous hypertension (idiopathic) with ulcer of right lower extremity S81.811A Laceration without foreign body, right lower leg, initial encounter L97.812 Non-pressure chronic ulcer of other part of right lower leg with fat layer exposed I48.0 Paroxysmal atrial fibrillation Z79.01 Long term (current) use of anticoagulants I10 Essential (primary) hypertension J44.9 Chronic obstructive pulmonary disease, unspecified Facility Procedures : CPT4 Code: 62035597 Description: [place] Freeman VISIT-LEV 3 EST PT Modifier: Quantity: 1 : CPT4 Code: 41638453 Description: 11042 - DEB SUBQ TISSUE 20 SQ CM/< ICD-10 Diagnosis Description M46.803 Non-pressure chronic ulcer of  other part of right lower leg with fat layer expo Modifier: sed Quantity: 1 Physician Procedures Billy Freeman, TESTA ([id number]): CPT4 Code Description [id number] WC PHYS LEVEL 3 NEW PT ICD-10 Diagnosis Description I87.311 Chronic venous hypertension (idiopathic) with ulcer of right lower S81.811A Laceration without foreign body, right lower leg, initial  encounte L97.812 Non-pressure chronic ulcer of other part of right lower leg with f I48.0 Paroxysmal atrial fibrillation [id number] Page 9 of 9: Quantity Modifier 25 1 extremity r at layer exposed KEAGAN, BRISLIN ([id number]): [id number] [id number] - WC PHYS SUBQ TISS 20 SQ CM ICD-10 Diagnosis Description L49.179 Non-pressure chronic ulcer of other part of right lower leg with f [id number] Page 9 of 9: 1 at layer exposed Electronic Signature(s) Signed: 06/13/2022 9:15:33 AM By: Billy Loud MSN RN CNS WTA Signed: 06/13/2022 2:32:55 PM By: Billy Keeler PA-C Previous Signature: 06/13/2022 8:51:43 AM Version By: Billy Keeler PA-C Entered By: Billy Freeman  on 06/13/2022 09:15:33

## 2022-06-13 NOTE — Progress Notes (Signed)
THI, SISEMORE ([id number]) [id number] Page 1 of 10 Visit Report for 06/13/2022 Allergy List Details Patient Name: Date of Service: Billy Freeman, Billy Freeman 06/13/2022 7:30 A M Medical Record Number: [id number] Patient Account Number: [id number] Date of Birth/Sex: Treating RN: 06-02-1951 (71 y.o. Male) Rosalio Loud Primary Care Ellean Firman: Tracie Harrier Other Clinician: Referring Laurelai Lepp: Treating Jedaiah Rathbun/Extender: Jeri Cos Self, Referral Weeks in Treatment: 0 Allergies Active Allergies oxycodone Reaction: Hives Severity: Mild Allergy Notes Electronic Signature(s) Signed: 06/13/2022 1:24:40 PM By: Rosalio Loud MSN RN CNS WTA Entered By: Rosalio Loud on 06/13/2022 08:33:37 -------------------------------------------------------------------------------- [place] Information Details Patient Name: Date of Service: Billy Freeman, Billy Freeman 06/13/2022 7:30 A M Medical Record Number: [id number] Patient Account Number: [id number] Date of Birth/Sex: Treating RN: 1950/08/18 (71 y.o. Male) Rosalio Loud Primary Care Arriel Victor: Tracie Harrier Other Clinician: Referring Lakota Markgraf: Treating Saniya Tranchina/Extender: Jeri Cos Self, Referral Weeks in Treatment: 0 Visit Information Patient Arrived: Ambulatory Arrival Time: 07:37 Accompanied By: self Transfer Assistance: None Patient Identification Verified: Yes Secondary Verification Process Completed: Yes Patient Requires Transmission-Based Precautions: No Patient Has Alerts: Yes Patient Alerts: Patient on Blood Thinner NOT Diabetic CORRY, IHNEN ([id number]) [id number] Page 2 of 10 Electronic Signature(s) Signed: 06/13/2022 1:24:40 PM By: Rosalio Loud MSN RN CNS WTA Entered By: Rosalio Loud on 06/13/2022 08:31:57 -------------------------------------------------------------------------------- Clinic Level of Care Assessment Details Patient Name: Date of Service: Billy Freeman, Billy Freeman 06/13/2022 7:30  A M Medical Record Number: [id number] Patient Account Number: [id number] Date of Birth/Sex: Treating RN: 1951/04/21 (71 y.o. Male) Rosalio Loud Primary Care Kesha Hurrell: Tracie Harrier Other Clinician: Referring Billy [name] Freeman: Treating Julee Stoll/Extender: Jeri Cos Self, Referral Weeks in Treatment: 0 Clinic Level of Care Assessment Items TOOL 1 Quantity Score X- 1 0 Use when EandM and Procedure is performed on INITIAL visit ASSESSMENTS - Nursing Assessment / Reassessment X- 1 20 General Physical Exam (combine w/ comprehensive assessment (listed just below) when performed on new pt. evals) X- 1 25 Comprehensive Assessment (HX, ROS, Risk Assessments, Wounds Hx, etc.) ASSESSMENTS - Wound and Skin Assessment / Reassessment _0  - 0 Dermatologic / Skin Assessment (not related to wound area) ASSESSMENTS - Ostomy and/or Continence Assessment and Care _1  - 0 Incontinence Assessment and Management _2  - 0 Ostomy Care Assessment and Management (repouching, etc.) PROCESS - Coordination of Care X - Simple Patient / Family Education for ongoing care 1 15 _3  - 0 Complex (extensive) Patient / Family Education for ongoing care X- 1 10 Staff obtains Programmer, systems, Records, T Results / Process Orders est _4  - 0 Staff telephones HHA, Nursing Homes / Clarify orders / etc _5  - 0 Routine Transfer to another Facility (non-emergent condition) _6  - 0 Routine Hospital Admission (non-emergent condition) X- 1 15 New Admissions / Biomedical engineer / Ordering NPWT Apligraf, etc. , _7  - 0 Emergency Hospital Admission (emergent condition) PROCESS - Special Needs _8  - 0 Pediatric / Minor Patient Management _9  - 0 Isolation Patient Management _10  - 0 Hearing / Language / Visual special needs _11  - 0 Assessment of Community assistance (transportation, D/C planning, etc.) _12  - 0 Additional assistance / Altered mentation _13  - 0 Support Surface(s) Assessment (bed, cushion, seat, etc.) INTERVENTIONS -  Miscellaneous _14  - 0 External ear exam _15  - 0 Patient Transfer (multiple staff / [employer] / Similar devices) Kake, [place] ([id number]) [id number] Page 3 of 10 _16  - 0 Simple Staple / Suture removal (25 or less) _17  - 0 Complex Staple / Suture removal (26 or more) _18  - 0 Hypo/Hyperglycemic Management (do not check if billed separately) X- 1 15  Ankle / Brachial Index (ABI) - do not check if billed separately Has the patient been seen at the hospital within the last three years: Yes Total Score: 100 Level Of Care: New/Established - Level 3 Electronic Signature(s) Signed: 06/13/2022 1:24:40 PM By: Rosalio Loud MSN RN CNS WTA Entered By: Rosalio Loud on 06/13/2022 09:15:20 -------------------------------------------------------------------------------- Encounter Discharge Information Details Patient Name: Date of Service: Billy Freeman, Billy Freeman 06/13/2022 7:30 A M Medical Record Number: [id number] Patient Account Number: [id number] Date of Birth/Sex: Treating RN: [birth year] (71 y.o. Male) Rosalio Loud Primary Care Kyesha Balla: Tracie Harrier Other Clinician: Referring Trudi Morgenthaler: Treating Rylin Seavey/Extender: Jeri Cos Self, Referral Weeks in Treatment: 0 Encounter Discharge Information Items Post Procedure Vitals Discharge Condition: Stable Temperature (F): 96.9 Ambulatory Status: Ambulatory Pulse (bpm): 96 Discharge Destination: Home Respiratory Rate (breaths/min): 16 Transportation: Private Auto Blood Pressure (mmHg): 121/75 Accompanied By: self Schedule Follow-up Appointment: Yes Clinical Summary of Care: Electronic Signature(s) Signed: 06/13/2022 9:16:40 AM By: Rosalio Loud MSN RN CNS WTA Entered By: Rosalio Loud on 06/13/2022 09:16:40 -------------------------------------------------------------------------------- Lower Extremity Assessment Details Patient Name: Date of Service: Billy Freeman, Billy Freeman 06/13/2022 7:30 A M Medical Record Number:  [id number] Patient Account Number: [id number] Date of Birth/Sex: Treating RN: February 14, 1951 (71 y.o. Male) Rosalio Loud Primary Care Kayveon Lennartz: Tracie Harrier Other Clinician: Referring Siarra Gilkerson: Treating Binta Statzer/Extender: Jeri Cos Self, Referral Weeks in Treatment: 0 Edema Assessment Left: [Left: Right] [Right: :] Assessed: [Left: No] [Right: No] Edema: [Left: Ye] [Right: s] Calf Left: Right: Point of Measurement: 31 cm From Medial Instep 35.4 cm Ankle Left: Right: Point of Measurement: 12 cm From Medial Instep 26.3 cm Vascular Assessment Pulses: Dorsalis Pedis Palpable: [Right:Yes] Popliteal Doppler Audible: [Right:Yes] Blood Pressure: Brachial: [Right:90] Ankle: [Right:Posterior Tibial: 121 1.34] Electronic Signature(s) Signed: 06/13/2022 1:24:40 PM By: Rosalio Loud MSN RN CNS WTA Entered By: Rosalio Loud on 06/13/2022 08:33:32 -------------------------------------------------------------------------------- Multi Wound Chart Details Patient Name: Date of Service: Billy Freeman, Billy Freeman 06/13/2022 7:30 A M Medical Record Number: [id number] Patient Account Number: [id number] Date of Birth/Sex: Treating RN: Jan 20, 1951 (71 y.o. Male) Rosalio Loud Primary Care Lessie Funderburke: Tracie Harrier Other Clinician: Referring Nazier Neyhart: Treating Shawneen Deetz/Extender: Jeri Cos Self, Referral Weeks in Treatment: 0 Vital Signs Height(in): 76 Pulse(bpm): 96 Weight(lbs): 208 Blood Pressure(mmHg): 121/75 Body Mass Index(BMI): 25.3 Temperature(F): 96.9 Respiratory Rate(breaths/min): 16 [1:Photos:] [N/A:N/A] Right, Medial Lower Leg N/A N/A Wound Location: Laceration N/A N/A Wounding Event: Venous Leg Ulcer N/A N/A Primary Etiology: Chronic Obstructive Pulmonary N/A N/A Comorbid HistoryMUSSA, GROESBECK ([id number]) [id number] Page 5 of 10 Disease (COPD), Hypertension 05/03/2022 N/A N/A Date Acquired: 0 N/A N/A Weeks of Treatment: Open N/A N/A Wound  Status: No N/A N/A Wound Recurrence: 1.6x1.7x0.3 N/A N/A Measurements L x W x D (cm) 2.136 N/A N/A A (cm) : rea 0.641 N/A N/A Volume (cm) : Full Thickness Without Exposed N/A N/A Classification: Support Structures Large N/A N/A Exudate A mount: Serosanguineous N/A N/A Exudate Type: red, brown N/A N/A Exudate Color: Small (1-33%) N/A N/A Granulation A mount: Red N/A N/A Granulation Quality: Small (1-33%) N/A N/A Necrotic A mount: Fat Layer (Subcutaneous Tissue): Yes N/A N/A Exposed Structures: Fascia: No Tendon: No Muscle: No Joint: No Bone: No Small (1-33%) N/A N/A Epithelialization: Debridement - Excisional N/A N/A Debridement: Pre-procedure Verification/Time Out 08:36 N/A N/A Taken: Subcutaneous, Slough N/A N/A Tissue Debrided: Skin/Subcutaneous Tissue N/A N/A Level: 3.06 N/A N/A Debridement A (sq cm): rea Curette N/A N/A Instrument: Minimum N/A N/A Bleeding: Pressure N/A N/A Hemostasis A chieved: Procedure was tolerated well N/A N/A Debridement Treatment Response: 1.6x1.7x0.4 N/A  N/A Post Debridement Measurements L x W x D (cm) 0.855 N/A N/A Post Debridement Volume: (cm) Debridement N/A N/A Procedures Performed: Treatment Notes Wound #1 (Lower Leg) Wound Laterality: Right, Medial Cleanser Byram Ancillary Kit - 15 Day Supply Discharge Instruction: Use supplies as instructed; Kit contains: (15) Saline Bullets; (15) 3x3 Gauze; 15 pr Gloves Peri-Wound Care Topical Primary Dressing Prisma 4.34 (in) Discharge Instruction: Moisten w/normal saline or sterile water; Cover wound as directed. Do not remove from wound bed. Secondary Dressing ABD Pad 5x9 (in/in) Discharge Instruction: Cover with ABD pad Secured With Kerlix Roll Sterile or Non-Sterile 6-ply 4.5x4 (yd/yd) Discharge Instruction: Apply Kerlix as directed Compression Wrap Compression Stockings Add-Ons Electronic Signature(s) Signed: 06/13/2022 9:16:05 AM By: Rosalio Loud MSN RN CNS  WTA Previous Signature: 06/13/2022 9:13:23 AM Version By: Rosalio Loud MSN RN CNS WTA Entered By: Rosalio Loud on 06/13/2022 09:16:04 Rosita Fire ([id number]) [id number] Page 6 of 10 -------------------------------------------------------------------------------- Multi-Disciplinary Care Plan Details Patient Name: Date of Service: Billy Freeman, Billy Freeman 06/13/2022 7:30 A M Medical Record Number: [id number] Patient Account Number: [id number] Date of Birth/Sex: Treating RN: August 29, 1950 (71 y.o. Male) Rosalio Loud Primary Care Eilam Shrews[place]: Tracie Harrier Other Clinician: Referring Tangie Stay: Treating Sanjith Siwek/Extender: Jeri Cos Self, Referral Weeks in Treatment: 0 Active Inactive Necrotic Tissue Nursing Diagnoses: Impaired tissue integrity related to necrotic/devitalized tissue Knowledge deficit related to management of necrotic/devitalized tissue Goals: Necrotic/devitalized tissue will be minimized in the wound bed Date Initiated: 06/13/2022 Target Resolution Date: 07/13/2022 Goal Status: Active Patient/caregiver will verbalize understanding of reason and process for debridement of necrotic tissue Date Initiated: 06/13/2022 Target Resolution Date: 07/13/2022 Goal Status: Active Interventions: Assess patient pain level pre-, during and post procedure and prior to discharge Provide education on necrotic tissue and debridement process Treatment Activities: Apply topical anesthetic as ordered : 06/13/2022 Excisional debridement : 06/13/2022 Notes: Orientation to the Wound Care Program Nursing Diagnoses: Knowledge deficit related to the wound healing center program Goals: Patient/caregiver will verbalize understanding of the [place] Date Initiated: 06/13/2022 Target Resolution Date: 06/27/2022 Goal Status: Active Interventions: Provide education on orientation to the wound center Notes: Venous Leg Ulcer Nursing Diagnoses: Potential for  venous Insuffiency (use before diagnosis confirmed) Goals: Patient will maintain optimal edema control Date Initiated: 06/13/2022 Target Resolution Date: 07/13/2022 Goal Status: Active Patient/caregiver will verbalize understanding of disease process and disease management Date Initiated: 06/13/2022 Target Resolution Date: 07/13/2022 Goal Status: Active Verify adequate tissue perfusion prior to therapeutic compression application Billy Freeman, Billy Freeman ([id number]) [phone].pdf Page 7 of 10 Date Initiated: 06/13/2022 Target Resolution Date: 06/13/2022 Goal Status: Active Interventions: Assess peripheral edema status every visit. Compression as ordered Provide education on venous insufficiency Treatment Activities: Therapeutic compression applied : 06/13/2022 Notes: Wound/Skin Impairment Nursing Diagnoses: Impaired tissue integrity Knowledge deficit related to ulceration/compromised skin integrity Goals: Patient will demonstrate a reduced rate of smoking or cessation of smoking Date Initiated: 06/13/2022 Target Resolution Date: 07/13/2022 Goal Status: Active Patient will have a decrease in wound volume by X% from date: (specify in notes) Date Initiated: 06/13/2022 Target Resolution Date: 07/13/2022 Goal Status: Active Patient/caregiver will verbalize understanding of skin care regimen Date Initiated: 06/13/2022 Target Resolution Date: 07/13/2022 Goal Status: Active Ulcer/skin breakdown will have a volume reduction of 30% by week 4 Date Initiated: 06/13/2022 Target Resolution Date: 07/13/2022 Goal Status: Active Ulcer/skin breakdown will have a volume reduction of 50% by week 8 Date Initiated: 06/13/2022 Target Resolution Date: 08/13/2022 Goal Status: Active Ulcer/skin breakdown will have a volume reduction of 80% by week 12 Date Initiated:  06/13/2022 Target Resolution Date: 09/13/2022 Goal Status: Active Ulcer/skin breakdown will heal within 14  weeks Date Initiated: 06/13/2022 Target Resolution Date: 09/27/2022 Goal Status: Active Interventions: Assess patient/caregiver ability to obtain necessary supplies Assess patient/caregiver ability to perform ulcer/skin care regimen upon admission and as needed Assess ulceration(s) every visit Provide education on ulcer and skin care Notes: Electronic Signature(s) Signed: 06/13/2022 9:13:08 AM By: Rosalio Loud MSN RN CNS WTA Entered By: Rosalio Loud on 06/13/2022 09:13:08 -------------------------------------------------------------------------------- Pain Assessment Details Patient Name: Date of Service: Billy Freeman, Billy Freeman 06/13/2022 7:30 [place] Record Number: [id number] Patient Account Number: [id number] Date of Birth/Sex: Treating RN: 05-04-1951 (71 y.o. Male) Rosalio Loud Primary Care Janett Kamath: Tracie Harrier Other Clinician: Referring Keairra Bardon: Treating Ensley Blas/Extender: Jeri Cos Self, Referral Weeks in TreatmentHarlin Heys [place], Gwenlyn Perking ([id number]) [id number] Page 8 of 10 Active Problems Location of Pain Severity and Description of Pain Patient Has Paino No Site Locations Pain Management and Medication Current Pain Management: Electronic Signature(s) Signed: 06/13/2022 1:24:40 PM By: Rosalio Loud MSN RN CNS WTA Entered By: Rosalio Loud on 06/13/2022 08:32:03 -------------------------------------------------------------------------------- Patient/Caregiver Education Details Patient Name: Date of Service: Billy Freeman 11/17/2023andnbsp7:30 [place] Record Number: [id number] Patient Account Number: [id number] Date of Birth/Gender: Treating RN: 1950-07-29 (71 y.o. Male) Rosalio Loud Primary Care Physician: Tracie Harrier Other Clinician: Referring Physician: Treating Physician/Extender: Jeri Cos Self, Referral Weeks in Treatment: 0 Education Assessment Education Provided To: Patient Education Topics Provided [place]: o Handouts: Welcome T [facility] [place] o Methods: Explain/Verbal Responses: State content correctly Wound Debridement: Handouts: Wound Debridement Methods: Explain/Verbal Responses: State content correctly Wound/Skin Impairment: Handouts: Caring for Your Ulcer Methods: Demonstration, [place], [place] ([id number]) [id number] Page 9 of 10 Responses: State content correctly Electronic Signature(s) Signed: 06/13/2022 1:24:40 PM By: Rosalio Loud MSN RN CNS WTA Entered By: Rosalio Loud on 06/13/2022 09:15:52 -------------------------------------------------------------------------------- Wound Assessment Details Patient Name: Date of Service: Billy Freeman, Billy Freeman 06/13/2022 7:30 A M Medical Record Number: [id number] Patient Account Number: [id number] Date of Birth/Sex: Treating RN: 1951/02/27 (71 y.o. Male) Rosalio Loud Primary Care Derelle Cockrell: Tracie Harrier Other Clinician: Referring Irma Roulhac: Treating Kyair Ditommaso/Extender: Jeri Cos Self, Referral Weeks in Treatment: 0 Wound Status Wound Number: 1 Primary Etiology: Venous Leg Ulcer Wound Location: Right, Medial Lower Leg Wound Status: Open Wounding Event: Laceration Comorbid Chronic Obstructive Pulmonary Disease (COPD), History: Hypertension Date Acquired: 05/03/2022 Weeks Of Treatment: 0 Clustered Wound: No Photos Wound Measurements Length: (cm) 1.6 Width: (cm) 1.7 Depth: (cm) 0.3 Area: (cm) 2.136 Volume: (cm) 0.641 % Reduction in Area: % Reduction in Volume: Epithelialization: Small (1-33%) Tunneling: No Undermining: No Wound Description Classification: Full Thickness Without Exposed Suppor Exudate Amount: Medium Exudate Type: Serosanguineous Exudate Color: red, brown t Structures Foul Odor After Cleansing: No Slough/Fibrino Yes Wound Bed Granulation Amount: Small (1-33%) Exposed Structure Granulation Quality: Red Fascia Exposed: No Necrotic Amount: Small  (1-33%) Fat Layer (Subcutaneous Tissue) Exposed: Yes Necrotic Quality: Adherent Slough Tendon Exposed: No Muscle Exposed: No Joint Exposed: No Bone Exposed: No DOW, BLAHNIK ([id number]) [id number] Page 10 of 10 Treatment Notes Wound #1 (Lower Leg) Wound Laterality: Right, Medial Cleanser Byram Ancillary Kit - 15 Day Supply Discharge Instruction: Use supplies as instructed; Kit contains: (15) Saline Bullets; (15) 3x3 Gauze; 15 pr Gloves Peri-Wound Care Topical Primary Dressing Prisma 4.34 (in) Discharge Instruction: Moisten w/normal saline or sterile water; Cover wound as directed. Do not remove from wound bed. Secondary Dressing ABD Pad 5x9 (in/in) Discharge Instruction: Cover with ABD pad Secured With Kerlix  Roll Sterile or Non-Sterile 6-ply 4.5x4 (yd/yd) Discharge Instruction: Apply Kerlix as directed Compression Wrap Compression Stockings Add-Ons Electronic Signature(s) Signed: 06/16/2022 1:22:33 PM By: Rosalio Loud MSN RN CNS WTA Previous Signature: 06/16/2022 7:41:26 AM Version By: Rosalio Loud MSN RN CNS WTA Previous Signature: 06/13/2022 1:24:40 PM Version By: Rosalio Loud MSN RN CNS WTA Entered By: Rosalio Loud on 06/16/2022 13:22:33 -------------------------------------------------------------------------------- Vitals Details Patient Name: Date of Service: Billy Freeman, Billy Freeman 06/13/2022 7:30 A M Medical Record Number: [id number] Patient Account Number: [id number] Date of Birth/Sex: Treating RN: August 02, 1950 (71 y.o. Male) Rosalio Loud Primary Care Jameca Chumley: Tracie Harrier Other Clinician: Referring Skylar Flynt: Treating Tiran Sauseda/Extender: Jeri Cos Self, Referral Weeks in Treatment: 0 Vital Signs Time Taken: 07:46 Temperature (F): 96.9 Height (in): 76 Pulse (bpm): 96 Source: Stated Respiratory Rate (breaths/min): 16 Weight (lbs): 208 Blood Pressure (mmHg): 121/75 Source: Stated Reference Range: 80 - 120 mg / dl Body Mass Index  (BMI): 25.3 Electronic Signature(s) Signed: 06/13/2022 1:24:40 PM By: Rosalio Loud MSN RN CNS WTA Entered By: Rosalio Loud on 06/13/2022 08:32:06

## 2022-06-13 NOTE — Progress Notes (Signed)
ERNEST, POPOWSKI ([id number]) [id number] Page 1 of 5 Visit Report for 06/13/2022 Abuse Risk Screen Details Patient Name: Date of Service: Billy Freeman, Billy Freeman 06/13/2022 7:30 A M Medical Record Number: [id number] Patient Account Number: [id number] Date of Birth/Sex: Treating RN: 1951/03/25 (71 y.o. Seward Meth Primary Care Cheskel Silverio: Tracie Harrier Other Clinician: Referring Kwamane Whack: Treating Amilah Greenspan/Extender: Jeri Cos Self, Referral Weeks in Treatment: 0 Abuse Risk Screen Items Answer Electronic Signature(s) Signed: 06/13/2022 1:24:40 PM By: Rosalio Loud MSN RN CNS WTA Entered By: Rosalio Loud on 06/13/2022 08:13:40 -------------------------------------------------------------------------------- Activities of Daily Living Details Patient Name: Date of Service: Billy Freeman, Billy Freeman 06/13/2022 7:30 A M Medical Record Number: [id number] Patient Account Number: [id number] Date of Birth/Sex: Treating RN: [birth year] (71 y.o. Seward Meth Primary Care Shantee Hayne: Tracie Harrier Other Clinician: Referring Tradarius Reinwald: Treating Laloni Rowton/Extender: Jeri Cos Self, Referral Weeks in Treatment: 0 Activities of Daily Living Items Answer Activities of Daily Living (Please select one for each item) Drive Automobile Completely Able T Medications ake Completely Able Use T elephone Completely Able Care for Appearance Completely Able Use T oilet Completely Able Bath / Shower Completely Able Dress Self Completely Able Feed Self Completely Able Walk Completely Able Get In / Out Bed Completely Able Housework Completely Able Prepare Meals Completely [place] for Self Completely REMIJIO, HOLLERAN ([id number]) [id number] Page 2 of 5 Electronic Signature(s) Signed: 06/13/2022 1:24:40 PM By: Rosalio Loud MSN RN CNS WTA Entered By: Rosalio Loud on 06/13/2022  08:13:53 -------------------------------------------------------------------------------- Education Screening Details Patient Name: Date of Service: Billy Freeman, Billy Freeman 06/13/2022 7:30 A M Medical Record Number: [id number] Patient Account Number: [id number] Date of Birth/Sex: Treating RN: Jan 09, 1951 (71 y.o. Seward Meth Primary Care Jeannine Pennisi: Tracie Harrier Other Clinician: Referring Riona Lahti: Treating Tenia Goh/Extender: Jeri Cos Self, Referral Weeks in Treatment: 0 Learning Preferences/Education Level/Primary Language Learning Preference: Explanation Highest Education Level: College or Above Preferred Language: English Cognitive Barrier Language Barrier: No Translator Needed: No Memory Deficit: No Emotional Barrier: No Cultural/Religious Beliefs Affecting Medical Care: No Physical Barrier Impaired Vision: Yes Impaired Hearing: No Decreased Hand dexterity: No Knowledge/Comprehension Knowledge Level: High Comprehension Level: High Ability to understand written instructions: High Ability to understand verbal instructions: High Motivation Anxiety Level: Calm Cooperation: Cooperative Education Importance: Acknowledges Need Interest in Health Problems: Asks Questions Perception: Coherent Willingness to Engage in Self-Management High Activities: Readiness to Engage in Self-Management High Activities: Electronic Signature(s) Signed: 06/13/2022 1:24:40 PM By: Rosalio Loud MSN RN CNS WTA Entered By: Rosalio Loud on 06/13/2022 08:14:26 Rosita Fire ([id number]) [phone] [id number] Page 3 of 5 -------------------------------------------------------------------------------- Fall Risk Assessment Details Patient Name: Date of Service: Billy Freeman, Billy Freeman 06/13/2022 7:30 A M Medical Record Number: [id number] Patient Account Number: [id number] Date of Birth/Sex: Treating RN: 08-03-1950 (71 y.o. Seward Meth Primary Care Mikail Goostree: Tracie Harrier Other  Clinician: Referring Cricket Goodlin: Treating Garin Mata/Extender: Jeri Cos Self, Referral Weeks in Treatment: 0 Fall Risk Assessment Items Have you had 2 or more falls in the last 12 monthso 0 No Have you had any fall that resulted in injury in the last 12 monthso 0 No FALLS RISK SCREEN History of falling - immediate or within 3 months 0 No Secondary diagnosis (Do you have 2 or more medical diagnoseso) 0 No Ambulatory aid None/bed rest/wheelchair/nurse 0 No Crutches/cane/walker 0 No Furniture 0 No Intravenous therapy Access/Saline/Heparin Lock 0 No Gait/Transferring Normal/ bed rest/ wheelchair 0 No Weak (short steps with or without shuffle, stooped but able to lift head while walking, may seek 0 No support  from furniture) Impaired (short steps with shuffle, may have difficulty arising from chair, head down, impaired 0 No balance) Mental Status Oriented to own ability 0 No Electronic Signature(s) Signed: 06/13/2022 1:24:40 PM By: Rosalio Loud MSN RN CNS WTA Entered By: Rosalio Loud on 06/13/2022 08:14:52 -------------------------------------------------------------------------------- Foot Assessment Details Patient Name: Date of Service: Billy Freeman, Billy Freeman 06/13/2022 7:30 A M Medical Record Number: [id number] Patient Account Number: [id number] Date of Birth/Sex: Treating RN: 04-01-1951 (71 y.o. Seward Meth Primary Care Alandria Butkiewicz: Tracie Harrier Other Clinician: Referring Ivyonna Hoelzel: Treating Isaly Fasching/Extender: Jeri Cos Self, Referral Weeks in Treatment: 0 Foot Assessment Items Site Locations [place], [place] ([id number]) [id number] Page 4 of 5 + = Sensation present, - = Sensation absent, C = Callus, U = Ulcer R = Redness, W = Warmth, M = Maceration, PU = Pre-ulcerative lesion F = Fissure, S = Swelling, D = Dryness Assessment Right: Left: Other Deformity: No No Prior Foot Ulcer: No No Prior Amputation: No No Charcot Joint: No No Ambulatory  Status: Gait: Electronic Signature(s) Signed: 06/13/2022 1:24:40 PM By: Rosalio Loud MSN RN CNS WTA Entered By: Rosalio Loud on 06/13/2022 08:16:56 -------------------------------------------------------------------------------- Nutrition Risk Screening Details Patient Name: Date of Service: Billy Freeman, Billy Freeman 06/13/2022 7:30 A M Medical Record Number: [id number] Patient Account Number: [id number] Date of Birth/Sex: Treating RN: 1951-01-29 (71 y.o. Seward Meth Primary Care Shantal Roan: Tracie Harrier Other Clinician: Referring Andy Moye: Treating Magnum Lunde/Extender: Jeri Cos Self, Referral Weeks in Treatment: 0 Height (in): 76 Weight (lbs): 208 Body Mass Index (BMI): 25.3 Nutrition Risk Screening Items Score Screening NUTRITION RISK SCREEN: I have an illness or condition that made me change the kind and/or amount of food I eat 0 No I eat fewer than two meals per day 0 No I eat few fruits and vegetables, or milk products 0 No I have three or more drinks of beer, liquor or wine almost every day 0 No I have tooth or mouth problems that make it hard for me to eat 0 No I don't always have enough money to buy the food I need 0 No Billy Freeman, Billy Freeman ([id number]) [id number] Page 5 of 5 I eat alone most of the time 0 No I take three or more different prescribed or over-the-counter drugs a day 1 Yes Without wanting to, I have lost or gained 10 pounds in the last six months 0 No I am not always physically able to shop, cook and/or feed myself 0 No Nutrition Protocols Good Risk Protocol 0 No interventions needed Moderate Risk Protocol High Risk Proctocol Risk Level: Good Risk Score: 1 Electronic Signature(s) Signed: 06/13/2022 1:24:40 PM By: Rosalio Loud MSN RN CNS WTA Entered By: Rosalio Loud on 06/13/2022 08:15:19

## 2022-06-30 ENCOUNTER — Encounter: Payer: Medicare Other | Attending: Physician Assistant | Admitting: Physician Assistant

## 2022-06-30 ENCOUNTER — Ambulatory Visit: Payer: Medicare Other | Admitting: Physician Assistant

## 2022-06-30 DIAGNOSIS — I87311 Chronic venous hypertension (idiopathic) with ulcer of right lower extremity: Secondary | ICD-10-CM | POA: Diagnosis not present

## 2022-06-30 DIAGNOSIS — I10 Essential (primary) hypertension: Secondary | ICD-10-CM | POA: Diagnosis not present

## 2022-06-30 DIAGNOSIS — I480 Paroxysmal atrial fibrillation: Secondary | ICD-10-CM | POA: Insufficient documentation

## 2022-06-30 DIAGNOSIS — J449 Chronic obstructive pulmonary disease, unspecified: Secondary | ICD-10-CM | POA: Diagnosis not present

## 2022-06-30 DIAGNOSIS — S81811A Laceration without foreign body, right lower leg, initial encounter: Secondary | ICD-10-CM | POA: Diagnosis not present

## 2022-06-30 DIAGNOSIS — Z7901 Long term (current) use of anticoagulants: Secondary | ICD-10-CM | POA: Diagnosis not present

## 2022-06-30 DIAGNOSIS — L97812 Non-pressure chronic ulcer of other part of right lower leg with fat layer exposed: Secondary | ICD-10-CM | POA: Diagnosis present

## 2022-06-30 NOTE — Progress Notes (Addendum)
Billy Freeman ([id number]) [id number] Page 1 of 6 Visit Report for 06/30/2022 Chief Complaint Document Details Patient Name: Date of Service: Billy Freeman, Billy Freeman 06/30/2022 10:00 A M Medical Record Number: [id number] Patient Account Number: [id number] Date of Birth/Sex: Treating RN: 1951-07-27 (71 y.o. Seward Meth Primary Care Provider: Tracie Harrier Other Clinician: Referring Provider: Treating Provider/Extender: Solon Palm Weeks in Treatment: 2 Information Obtained from: Patient Chief Complaint Right LE Ulcer Electronic Signature(s) Signed: 06/30/2022 9:59:53 AM By: Worthy Keeler PA-C Entered By: Worthy Keeler on 06/30/2022 09:59:53 -------------------------------------------------------------------------------- HPI Details Patient Name: Date of Service: Billy Freeman 06/30/2022 10:00 [place] Record Number: [id number] Patient Account Number: [id number] Date of Birth/Sex: Treating RN: [birth year] (71 y.o. Seward Meth Primary Care Provider: Tracie Harrier Other Clinician: Referring Provider: Treating Provider/Extender: Solon Palm Weeks in Treatment: 2 History of Present Illness HPI Description: 06-13-2022 upon evaluation today patient appears to be doing poorly currently in regard to the wound on his leg. He subsequently tells me that he hit this on a piece of wood when he was putting something together this was around December 7. Subsequently he states that since that time he has been having issues with getting it to heal at 1 point he believes it was likely infected it was hurting quite severely. He ended up actually switching to bacitracin ointment at that point he tells me a lot of the severe pain improved following. With that being said he tells me that with what we are seeing here currently that he was still having a lot of issues with getting this to close and therefore wanted to come into the wound  care center as he has had a good experience with his mother before going to the wound care center in [place] where they were able to get the wound healed that otherwise had been recommended may require amputation. He has been in the hospital for his heart and tells me that during that time he had been previously that using Neosporin and a Band-Aid. In the hospital he was told to use Medihoney and a silver alginate to cover. With that being said he tells me that it really has not helped tremendously he eventually went to using the bacitracin which has been more recent. Patient does have a history of what appears to be venous insufficiency of the lower extremities he tells me has been trying to elevate his legs much as possible. He also has a history of atrial fibrillation, long-term use of anticoagulant therapy due to this he is on Eliquis, he does have hypertension, and COPD. He is also more recently been in the hospital for his heart he has a heart monitor on even now. 06-30-2022 upon evaluation today patient appears to be doing well currently in regard to his wound he seems to be making good progress which is great news. I see no signs of infection. ANDRES, BANTZ ([id number]) [id number] Page 2 of 6 Electronic Signature(s) Signed: 06/30/2022 1:37:56 PM By: Worthy Keeler PA-C Entered By: Worthy Keeler on 06/30/2022 13:37:56 -------------------------------------------------------------------------------- Physical Exam Details Patient Name: Date of Service: Billy Freeman 06/30/2022 10:00 A M Medical Record Number: [id number] Patient Account Number: [id number] Date of Birth/Sex: Treating RN: [birth year] (71 y.o. Seward Meth Primary Care Provider: Tracie Harrier Other Clinician: Referring Provider: Treating Provider/Extender: Solon Palm Weeks in Treatment: 2 Constitutional Well-nourished and well-hydrated in no acute  distress. Respiratory normal breathing without difficulty. Psychiatric this patient is able  to make decisions and demonstrates good insight into disease process. Alert and Oriented x 3. pleasant and cooperative. Notes Upon inspection patient's wound bed actually showed signs of good granulation and epithelization at this point. Fortunately there does not appear to be any signs of infection at this time which is great news and overall I am extremely pleased with where we stand. Electronic Signature(s) Signed: 06/30/2022 1:38:13 PM By: Worthy Keeler PA-C Entered By: Worthy Keeler on 06/30/2022 13:38:12 -------------------------------------------------------------------------------- Physician Orders Details Patient Name: Date of Service: Billy Freeman 06/30/2022 10:00 [place] Record Number: [id number] Patient Account Number: [id number] Date of Birth/Sex: Treating RN: Oct 27, 1950 (71 y.o. 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 - Wash with Vashe. If burns too bad then use antibacterial soap Anesthetic (Use 'Patient Medications' Section for Anesthetic Order Entry) Lidocaine applied to wound bed Wound  Treatment Wound #1 - Lower Leg Wound Laterality: Right, Medial Cleanser: Byram Ancillary Kit - 15 Day Supply (Generic) 3 x Per Week/30 Days Discharge Instructions: Use supplies as instructed; Kit contains: (15) Saline Bullets; (15) 3x3 Gauze; 15 pr Gloves Prim Dressing: Prisma 4.34 (in) (Dispense As Written) 3 x Per Week/30 Days ary Discharge Instructions: Moisten w/normal saline or sterile water; Cover wound as directed. Do not remove from wound bed. Secondary Dressing: ABD Pad 5x9 (in/in) (Generic) 3 x Per Week/30 Days Discharge Instructions: Cover with ABD pad Secured With: Kerlix Roll Sterile or Non-Sterile 6-ply 4.5x4 (yd/yd) (Generic) 3 x Per Week/30 Days Discharge Instructions: Apply Kerlix as directed Electronic Signature(s) Signed: 06/30/2022 10:45:31 AM By: Rosalio Loud MSN RN CNS WTA Signed: 06/30/2022 4:48:10 PM By: Worthy Keeler PA-C Entered By: Rosalio Loud on 06/30/2022 10:45:30 -------------------------------------------------------------------------------- Problem List Details Patient Name: Date of Service: VERLAND, SPRINKLE 06/30/2022 10:00 A M Medical Record Number: [id number] Patient Account Number: [id number] Date of Birth/Sex: Treating RN: 08-Apr-1951 (71 y.o. 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 06/13/2022 No Yes S81.811A Laceration without foreign body, right lower leg, initial encounter 06/13/2022 No Yes L97.812 Non-pressure chronic ulcer of other part of right lower leg with fat layer 06/13/2022 No Yes exposed DAWIT, TANKARD ([id number]) [id number] Page 4 of 6 I48.0 Paroxysmal atrial fibrillation 06/13/2022 No Yes Z79.01 Long term (current)  use of anticoagulants 06/13/2022 No Yes I10 Essential (primary) hypertension 06/13/2022 No Yes J44.9 Chronic obstructive pulmonary disease, unspecified 06/13/2022 No Yes Inactive Problems Resolved Problems Electronic Signature(s) Signed: 06/30/2022 10:47:04 AM By: Rosalio Loud MSN RN CNS WTA Signed: 06/30/2022 4:48:10 PM By: Worthy Keeler PA-C Previous Signature: 06/30/2022 9:59:50 AM Version By: Worthy Keeler PA-C Entered By: Rosalio Loud on 06/30/2022 10:47:03 -------------------------------------------------------------------------------- Progress Note Details Patient Name: Date of Service: Lesle Reek 06/30/2022  10:00 A M Medical Record Number: [id number] Patient Account Number: [id number] Date of Birth/Sex: Treating RN: 01/12/1951 (71 y.o. Seward Meth Primary Care Provider: Tracie Harrier Other Clinician: Referring Provider: Treating Provider/Extender: Solon Palm Weeks in Treatment: 2 Subjective Chief Complaint Information obtained from Patient Right LE Ulcer History of Present Illness (HPI) 06-13-2022 upon evaluation today patient appears to be doing poorly currently in regard to the wound on his leg. He subsequently tells me that he hit this on a piece of wood when he was putting something together this was around December 7. Subsequently he states that since that time he has been having issues with getting it to heal at 1 point he believes it was likely infected it was hurting quite severely. He ended up actually switching to bacitracin ointment at that point he tells me a lot of the severe pain improved following. With that being said he tells me that with what we are seeing here currently that he was still having a lot of issues with getting this to close and therefore wanted to come into the wound care center as he has had a good experience with his mother before going to the wound care center in [place] where they were able to get the wound  healed that otherwise had been recommended may require amputation. He has been in the hospital for his heart and tells me that during that time he had been previously that using Neosporin and a Band-Aid. In the hospital he was told to use Medihoney and a silver alginate to cover. With that being said he tells me that it really has not helped tremendously he eventually went to using the bacitracin which has been more recent. Patient does have a history of what appears to be venous insufficiency of the lower extremities he tells me has been trying to elevate his legs much as possible. He also has a history of atrial fibrillation, long-term use of anticoagulant therapy due to this he is on Eliquis, he does have hypertension, and COPD. He is also more recently been in the hospital for his heart he has a heart monitor on even now. 06-30-2022 upon evaluation today patient appears to be doing well currently in regard to his wound he seems to be making good progress which is great news. I see no signs of infection. JENS, SIEMS ([id number]) [id number] Page 5 of 6 Objective Constitutional Well-nourished and well-hydrated in no acute distress. Vitals Time Taken: 10:31 AM, Height: 76 in, Weight: 208 lbs, BMI: 25.3, Temperature: 97.9 F, Pulse: 72 bpm, Respiratory Rate: 16 breaths/min, Blood Pressure: 110/75 mmHg. Respiratory normal breathing without difficulty. Psychiatric this patient is able to make decisions and demonstrates good insight into disease process. Alert and Oriented x 3. pleasant and cooperative. General Notes: Upon inspection patient's wound bed actually showed signs of good granulation and epithelization at this point. Fortunately there does not appear to be any signs of infection at this time which is great news and overall I am extremely pleased with where we stand. Integumentary (Hair, Skin) Wound #1 status is Open. Original cause of wound was Laceration.  The date acquired was: 05/03/2022. The wound has been in treatment 2 weeks. The wound is located on the Right,Medial Lower Leg. The wound measures 1.4cm length x 1cm width x 0.3cm depth; 1.1cm^2 area and 0.33cm^3 volume. There is Fat Layer (Subcutaneous Tissue) exposed. There is a medium amount of serosanguineous drainage noted. There is small (1-33%) red granulation within the wound bed.  There is a small (1-33%) amount of necrotic tissue within the wound bed including Adherent Slough. Assessment Active Problems ICD-10 Chronic venous hypertension (idiopathic) with ulcer of right lower extremity Laceration without foreign body, right lower leg, initial encounter Non-pressure chronic ulcer of other part of right lower leg with fat layer exposed Paroxysmal atrial fibrillation Long term (current) use of anticoagulants Essential (primary) hypertension Chronic obstructive pulmonary disease, unspecified Plan Bathing/ Shower/ Hygiene: Wash wounds with antibacterial soap and water. - Wash with Vashe. If burns too bad then use antibacterial soap Anesthetic (Use 'Patient Medications' Section for Anesthetic Order Entry): Lidocaine applied to wound bed WOUND #1: - Lower Leg Wound Laterality: Right, Medial Cleanser: Byram Ancillary Kit - 15 Day Supply (Generic) 3 x Per Week/30 Days Discharge Instructions: Use supplies as instructed; Kit contains: (15) Saline Bullets; (15) 3x3 Gauze; 15 pr Gloves Prim Dressing: Prisma 4.34 (in) (Dispense As Written) 3 x Per Week/30 Days ary Discharge Instructions: Moisten w/normal saline or sterile water; Cover wound as directed. Do not remove from wound bed. Secondary Dressing: ABD Pad 5x9 (in/in) (Generic) 3 x Per Week/30 Days Discharge Instructions: Cover with ABD pad Secured With: Kerlix Roll Sterile or Non-Sterile 6-ply 4.5x4 (yd/yd) (Generic) 3 x Per Week/30 Days Discharge Instructions: Apply Kerlix as directed 1. I am going to suggest that we have the patient  continue to monitor for any evidence of infection or worsening. Based on what I am seeing I do think that we are headed in the right direction and overall I think that he is doing well. I do believe that he should continue to change this at home since it is doing so great at this point I do not see a reason to make him a change in the overall treatment plan. 2. Recommend continue with the collagen followed by ABD pad and roll gauze and then he is using the Tubigrip as well. 3. I would also recommend that he continue to elevate his legs much as possible to help with edema control. We will see patient back for reevaluation in 1 week here in the clinic. If anything worsens or changes patient will contact our office for additional recommendations. Electronic Signature(s) Signed: 06/30/2022 1:38:51 PM By: Worthy Keeler PA-C Entered By: Worthy Keeler on 06/30/2022 13:38:50 Rosita Fire ([id number]) [id number] Page 6 of 6 -------------------------------------------------------------------------------- SuperBill Details Patient Name: Date of Service: KERRIGAN, GLENDENING 06/30/2022 Medical Record Number: [id number] Patient Account Number: [id number] Date of Birth/Sex: Treating RN: [birth year] (71 y.o. 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 S81.811A Laceration without foreign body, right lower leg, initial encounter L97.812 Non-pressure chronic ulcer of other part of right lower leg with fat layer exposed I48.0 Paroxysmal atrial fibrillation Z79.01 Long term (current) use of anticoagulants I10 Essential (primary) hypertension J44.9 Chronic obstructive pulmonary disease, unspecified Facility  Procedures : CPT4 Code: [id number] Description: 99213 - WOUND CARE VISIT-LEV 3 EST PT Modifier: Quantity: 1 Physician Procedures : CPT4 Code Description Modifier [id number] - WC PHYS LEVEL 3 - EST PT ICD-10 Diagnosis Description 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

## 2022-06-30 NOTE — Progress Notes (Addendum)
Billy Freeman ([id number]) [id number] Page 1 of 10 Visit Report for 06/30/2022 Arrival Information Details Patient Name: Date of Service: Billy Freeman, Billy Freeman 06/30/2022 10:00 [place] Record Number: [id number] Patient Account Number: [id number] Date of Birth/Sex: Treating RN: 14-Nov-1950 (71 y.o. Seward Meth Primary Care Rhiley Tarver: Tracie Harrier Other Clinician: Referring Temperence Zenor: Treating Kooper Chriswell/Extender: Solon Palm Weeks in Treatment: 2 Visit Information History Since Last Visit Added or deleted any medications: No Patient Arrived: Ambulatory Any new allergies or adverse reactions: No Arrival Time: 10:29 Had a fall or experienced change in No Accompanied By: wife activities of daily living that may affect Transfer Assistance: None risk of falls: Patient Requires Transmission-Based Precautions: No Hospitalized since last visit: No Patient Has Alerts: Yes Pain Present Now: No Patient Alerts: Patient on Blood Thinner NOT Diabetic Eliquis Electronic Signature(s) Signed: 06/30/2022 10:44:54 AM By: Rosalio Loud MSN RN CNS WTA Entered By: Rosalio Loud on 06/30/2022 10:44:53 -------------------------------------------------------------------------------- Clinic Level of Care Assessment Details Patient Name: Date of Service: Billy Freeman 06/30/2022 10:00 [place] Record Number: [id number] Patient Account Number: [id number] Date of Birth/Sex: Treating RN: [birth year] (71 y.o. Seward Meth Primary Care Cane Dubray: Tracie Harrier Other Clinician: Referring Draven Natter: Treating Heidy Mccubbin/Extender: Solon Palm Weeks in Treatment: 2 Clinic Level of Care Assessment Items TOOL 4 Quantity Score X- 1 0 Use when only an EandM is performed on FOLLOW-UP visit ASSESSMENTS - Nursing Assessment / Reassessment X- 1 10 Reassessment of Co-morbidities (includes updates in patient status) X- 1 5 Reassessment of Adherence to  Treatment Plan ASSESSMENTS - Wound and Skin A ssessment / Reassessment X - Simple Wound Assessment / Reassessment - one wound 1 5 _0  - 0 Complex Wound Assessment / Reassessment - multiple wounds Billy Freeman ([id number]) [id number] Page 2 of 10 _1  - 0 Dermatologic / Skin Assessment (not related to wound area) ASSESSMENTS - Focused Assessment _2  - 0 Circumferential Edema Measurements - multi extremities _3  - 0 Nutritional Assessment / Counseling / Intervention _4  - 0 Lower Extremity Assessment (monofilament, tuning fork, pulses) _5  - 0 Peripheral Arterial Disease Assessment (using hand held doppler) ASSESSMENTS - Ostomy and/or Continence Assessment and Care _6  - 0 Incontinence Assessment and Management _7  - 0 Ostomy Care Assessment and Management (repouching, etc.) PROCESS - Coordination of Care X - Simple Patient / Family Education for ongoing care 1 15 _8  - 0 Complex (extensive) Patient / Family Education for ongoing care X- 1 10 Staff obtains Programmer, systems, Records, T Results / Process Orders est _9  - 0 Staff telephones HHA, Nursing Homes / Clarify orders / etc _10  - 0 Routine Transfer to another Facility (non-emergent condition) _11  - 0 Routine Hospital Admission (non-emergent condition) _12  - 0 New Admissions / Biomedical engineer / Ordering NPWT Apligraf, etc. , _13  - 0 Emergency Hospital Admission (emergent condition) X- 1 10 Simple Discharge Coordination _14  - 0 Complex (extensive) Discharge Coordination PROCESS - Special Needs _15  - 0 Pediatric / Minor Patient Management _16  - 0 Isolation Patient Management _17  - 0 Hearing / Language / Visual special needs _18  - 0 Assessment of Community assistance (transportation, D/C planning, etc.) _19  - 0 Additional assistance / Altered mentation _20  - 0 Support Surface(s) Assessment (bed, cushion, seat, etc.) INTERVENTIONS - Wound Cleansing / Measurement X - Simple Wound Cleansing - one wound 1  5 _21  - 0 Complex Wound Cleansing - multiple wounds X- 1 5 Wound Imaging (photographs - any number of wounds) _22  - 0 Wound Tracing (instead of photographs) X- 1  5 Simple Wound Measurement - one wound _0  - 0 Complex Wound Measurement - multiple wounds INTERVENTIONS - Wound Dressings X - Small Wound Dressing one or multiple wounds 1 10 _1  - 0 Medium Wound Dressing one or multiple wounds _2  - 0 Large Wound Dressing one or multiple wounds <EEFEOFHQRFXJOITG>_5<\/QDIYMEBRAXENMMHW>_8  - 0 Application of Medications - topical <GSUPJSRPRXYVOPFY>_9<\/WKMQKMMNOTRRNHAF>_7  - 0 Application of Medications - injection INTERVENTIONS - Miscellaneous _5  - 0 External ear exam _6  - 0 Specimen Collection (cultures, biopsies, blood, body fluids, etc.) _7  - 0 Specimen(s) / Culture(s) sent or taken to Lab for analysis Billy Freeman ([id number]) [id number] Page 3 of 10 _8  - 0 Patient Transfer (multiple staff / Civil Service fast streamer / Similar devices) _9  - 0 Simple Staple / Suture removal (25 or less) _10  - 0 Complex Staple / Suture removal (26 or more) _11  - 0 Hypo / Hyperglycemic Management (close monitor of Blood Glucose) _12  - 0 Ankle / Brachial Index (ABI) - do not check if billed separately X- 1 5 Vital Signs Has the patient been seen at the hospital within the last three years: Yes Total Score: 85 Level Of Care: New/Established - Level 3 Electronic Signature(s) Signed: 06/30/2022 4:32:46 PM By: Rosalio Loud MSN RN CNS WTA Entered By: Rosalio Loud on 06/30/2022 10:46:21 -------------------------------------------------------------------------------- Encounter Discharge Information Details Patient Name: Date of Service: BUZZ, AXEL 06/30/2022 10:00 [place] Record Number: [id number] Patient Account Number: [id number] Date of Birth/Sex: Treating RN: 1951/03/09 (71 y.o. Seward Meth Primary Care Dajanay Northrup: Tracie Harrier Other Clinician: Referring Tailor Westfall: Treating Flower Franko/Extender: Solon Palm Weeks in Treatment:  2 Encounter Discharge Information Items Discharge Condition: Stable Ambulatory Status: Ambulatory Discharge Destination: Home Transportation: Private Auto Accompanied By: wife Schedule Follow-up Appointment: Yes Clinical Summary of Care: Electronic Signature(s) Signed: 06/30/2022 4:32:46 PM By: Rosalio Loud MSN RN CNS WTA Previous Signature: 06/30/2022 10:47:52 AM Version By: Rosalio Loud MSN RN CNS WTA Entered By: Rosalio Loud on 06/30/2022 11:09:22 -------------------------------------------------------------------------------- Lower Extremity Assessment Details Patient Name: Date of Service: CAYDENCE, ENCK 06/30/2022 10:00 [place] Record Number: [id number] Patient Account Number: [id number] Date of Birth/Sex: Treating RN: [birth year] (71 y.o. Seward Meth Primary Care Robbye Dede: Tracie Harrier Other Clinician: OTHAR, CURTO ([id number]) [id number] Page 4 of 10 Referring Chavez Rosol: Treating Karyl Sharrar/Extender: Nadara Mustard, Vishwanath Weeks in Treatment: 2 Edema Assessment Assessed: [Left: No] [Right: No] [Left: Edema] [Right: :] Calf Left: Right: Point of Measurement: From Medial Instep 37.5 cm Ankle Left: Right: Point of Measurement: From Medial Instep 23.5 cm Electronic Signature(s) Signed: 06/30/2022 4:32:46 PM By: Rosalio Loud MSN RN CNS WTA Entered By: Rosalio Loud on 06/30/2022 10:40:13 -------------------------------------------------------------------------------- Multi Wound Chart Details Patient Name: Date of Service: ROBET, CRUTCHFIELD 06/30/2022 10:00 A M Medical Record Number: [id number] Patient Account Number: [id number] Date of Birth/Sex: Treating RN: 1950/09/01 (71 y.o. Seward Meth Primary Care Jansel Vonstein: Tracie Harrier Other Clinician: Referring Stori Royse: Treating Savilla Turbyfill/Extender: Solon Palm Weeks in Treatment: 2 Vital Signs Height(in): 76 Pulse(bpm): 72 Weight(lbs): 208 Blood Pressure(mmHg):  110/75 Body Mass Index(BMI): 25.3 Temperature(F): 97.9 Respiratory Rate(breaths/min): 16 [1:Photos:] [N/A:N/A] Right, Medial Lower Leg N/A N/A Wound Location: Laceration N/A N/A Wounding Event: Venous Leg Ulcer N/A N/A Primary Etiology: Chronic Obstructive Pulmonary N/A N/A Comorbid History: Disease (COPD), Hypertension 05/03/2022 N/A N/A Date Acquired: 2 N/A N/A Weeks of Treatment: Open N/A N/A Wound Status: No N/A N/A Wound Recurrence: 1.4x1x0.3 N/A N/A Measurements L x W x D (cm) 1.1 N/A N/A A (cm) : rea 0.33 N/A N/A  Volume (cm) : 48.50% N/A N/A % Reduction in Area: STARLING, CHRISTOFFERSON ([id number]) [id number] Page 5 of 10 48.50% N/A N/A % Reduction in Volume: Full Thickness Without Exposed N/A N/A Classification: Support Structures Medium N/A N/A Exudate Amount: Serosanguineous N/A N/A Exudate Type: red, brown N/A N/A Exudate Color: Small (1-33%) N/A N/A Granulation Amount: Red N/A N/A Granulation Quality: Small (1-33%) N/A N/A Necrotic Amount: Fat Layer (Subcutaneous Tissue): Yes N/A N/A Exposed Structures: Fascia: No Tendon: No Muscle: No Joint: No Bone: No Small (1-33%) N/A N/A Epithelialization: Treatment Notes Wound #1 (Lower Leg) Wound Laterality: Right, Medial Cleanser Byram Ancillary Kit - 15 Day Supply Discharge Instruction: Use supplies as instructed; Kit contains: (15) Saline Bullets; (15) 3x3 Gauze; 15 pr Gloves Peri-Wound Care Topical Primary Dressing Prisma 4.34 (in) Discharge Instruction: Moisten w/normal saline or sterile water; Cover wound as directed. Do not remove from wound bed. Secondary Dressing ABD Pad 5x9 (in/in) Discharge Instruction: Cover with ABD pad Secured With Kerlix Roll Sterile or Non-Sterile 6-ply 4.5x4 (yd/yd) Discharge Instruction: Apply Kerlix as directed Compression Wrap Compression Stockings Add-Ons Electronic Signature(s) Signed: 06/30/2022 4:32:46 PM By: Rosalio Loud MSN RN  CNS WTA Entered By: Rosalio Loud on 06/30/2022 10:57:11 -------------------------------------------------------------------------------- Multi-Disciplinary Care Plan Details Patient Name: Date of Service: MORRELL, FLUKE 06/30/2022 10:00 [place] Record Number: [id number] Patient Account Number: [id number] Date of Birth/Sex: Treating RN: 1951-04-06 (71 y.o. Seward Meth Primary Care Travius Crochet: Tracie Harrier Other Clinician: Referring Lulie Hurd: Treating Berneta Sconyers/Extender: Solon Palm Weeks in Treatment: [address] [place], Gwenlyn Perking ([id number]) [id number] Page 6 of 10 Necrotic Tissue Nursing Diagnoses: Impaired tissue integrity related to necrotic/devitalized tissue Knowledge deficit related to management of necrotic/devitalized tissue Goals: Necrotic/devitalized tissue will be minimized in the wound bed Date Initiated: 06/13/2022 Target Resolution Date: 07/13/2022 Goal Status: Active Patient/caregiver will verbalize understanding of reason and process for debridement of necrotic tissue Date Initiated: 06/13/2022 Target Resolution Date: 07/13/2022 Goal Status: Active Interventions: Assess patient pain level pre-, during and post procedure and prior to discharge Provide education on necrotic tissue and debridement process Treatment Activities: Apply topical anesthetic as ordered : 06/13/2022 Excisional debridement : 06/13/2022 Notes: Orientation to the Wound Care Program Nursing Diagnoses: Knowledge deficit related to the wound healing center program Goals: Patient/caregiver will verbalize understanding of the [place] Date Initiated: 06/13/2022 Target Resolution Date: 06/27/2022 Goal Status: Active Interventions: Provide education on orientation to the wound center Notes: Venous Leg Ulcer Nursing Diagnoses: Potential for venous Insuffiency (use before diagnosis confirmed) Goals: Patient will  maintain optimal edema control Date Initiated: 06/13/2022 Target Resolution Date: 07/13/2022 Goal Status: Active Patient/caregiver will verbalize understanding of disease process and disease management Date Initiated: 06/13/2022 Target Resolution Date: 07/13/2022 Goal Status: Active Verify adequate tissue perfusion prior to therapeutic compression application Date Initiated: 06/13/2022 Target Resolution Date: 06/13/2022 Goal Status: Active Interventions: Assess peripheral edema status every visit. Compression as ordered Provide education on venous insufficiency Treatment Activities: Therapeutic compression applied : 06/13/2022 Notes: Wound/Skin Impairment Nursing Diagnoses: Impaired tissue integrity Knowledge deficit related to ulceration/compromised skin integrity Goals: Patient will demonstrate a reduced rate of smoking or cessation of smoking Date Initiated: 06/13/2022 Target Resolution Date: 07/13/2022 Goal Status: Active Patient will have a decrease in wound volume by X% from date: (specify in notes) Date Initiated: 06/13/2022 Target Resolution Date: 07/13/2022 WENDY, MIKLES ([id number]) [id number] Page 7 of 10 Goal Status: Active Patient/caregiver will verbalize understanding of skin care regimen Date Initiated: 06/13/2022 Target Resolution Date: 07/13/2022 Goal Status: Active Ulcer/skin breakdown  will have a volume reduction of 30% by week 4 Date Initiated: 06/13/2022 Target Resolution Date: 07/13/2022 Goal Status: Active Ulcer/skin breakdown will have a volume reduction of 50% by week 8 Date Initiated: 06/13/2022 Target Resolution Date: 08/13/2022 Goal Status: Active Ulcer/skin breakdown will have a volume reduction of 80% by week 12 Date Initiated: 06/13/2022 Target Resolution Date: 09/13/2022 Goal Status: Active Ulcer/skin breakdown will heal within 14 weeks Date Initiated: 06/13/2022 Target Resolution Date: 09/27/2022 Goal Status:  Active Interventions: Assess patient/caregiver ability to obtain necessary supplies Assess patient/caregiver ability to perform ulcer/skin care regimen upon admission and as needed Assess ulceration(s) every visit Provide education on ulcer and skin care Notes: Electronic Signature(s) Signed: 06/30/2022 10:46:54 AM By: Rosalio Loud MSN RN CNS WTA Entered By: Rosalio Loud on 06/30/2022 10:46:54 -------------------------------------------------------------------------------- Pain Assessment Details Patient Name: Date of Service: CHALMER, ZHENG 06/30/2022 10:00 [place] Record Number: [id number] Patient Account Number: [id number] Date of Birth/Sex: Treating RN: [birth year] (71 y.o. Seward Meth Primary Care Shaindy Reader: Tracie Harrier Other Clinician: Referring Amera Banos: Treating Jimma Ortman/Extender: Solon Palm Weeks in Treatment: 2 Active Problems Location of Pain Severity and Description of Pain Patient Has Paino No Site Locations [place], [place] ([id number]) [id number] Page 8 of 10 Pain Management and Medication Current Pain Management: Electronic Signature(s) Signed: 06/30/2022 10:45:03 AM By: Rosalio Loud MSN RN CNS WTA Entered By: Rosalio Loud on 06/30/2022 10:45:03 -------------------------------------------------------------------------------- Patient/Caregiver Education Details Patient Name: Date of Service: Lesle Reek 12/4/2023andnbsp10:00 A M Medical Record Number: [id number] Patient Account Number: [id number] Date of Birth/Gender: Treating RN: [birth year] (71 y.o. Seward Meth Primary Care Physician: Tracie Harrier Other Clinician: Referring Physician: Treating Physician/Extender: Solon Palm Weeks in Treatment: 2 Education Assessment Education Provided To: Patient Education Topics Provided Wound/Skin Impairment: Handouts: Caring for Your Ulcer Methods: Explain/Verbal Responses: State content  correctly Electronic Signature(s) Signed: 06/30/2022 4:32:46 PM By: Rosalio Loud MSN RN CNS WTA Entered By: Rosalio Loud on 06/30/2022 10:46:49 -------------------------------------------------------------------------------- Wound Assessment Details Patient Name: Date of Service: TAKOTA, CAHALAN 06/30/2022 10:00 A M Medical Record Number: [id number] Patient Account Number: [id number] Date of Birth/Sex: Treating RN: 06/24/1951 (71 y.o. Seward Meth Primary Care Kilo Eshelman: Tracie Harrier Other Clinician: Referring Jeromiah Ohalloran: Treating Nas Wafer/Extender: Solon Palm Weeks in Treatment: 2 Wound Status Wound Number: 1 Primary Etiology: Venous Leg [place], [place] ([id number]) [id number] Page 9 of 10 Wound Location: Right, Medial Lower Leg Wound Status: Open Wounding Event: Laceration Comorbid Chronic Obstructive Pulmonary Disease (COPD), History: Hypertension Date Acquired: 05/03/2022 Weeks Of Treatment: 2 Clustered Wound: No Photos Wound Measurements Length: (cm) 1.4 Width: (cm) 1 Depth: (cm) 0.3 Area: (cm) 1.1 Volume: (cm) 0.33 % Reduction in Area: 48.5% % Reduction in Volume: 48.5% Epithelialization: Small (1-33%) Wound Description Classification: Full Thickness Without Exposed Support Structures Exudate Amount: Medium Exudate Type: Serosanguineous Exudate Color: red, brown Foul Odor After Cleansing: No Slough/Fibrino Yes Wound Bed Granulation Amount: Small (1-33%) Exposed Structure Granulation Quality: Red Fascia Exposed: No Necrotic Amount: Small (1-33%) Fat Layer (Subcutaneous Tissue) Exposed: Yes Necrotic Quality: Adherent Slough Tendon Exposed: No Muscle Exposed: No Joint Exposed: No Bone Exposed: No Treatment Notes Wound #1 (Lower Leg) Wound Laterality: Right, Medial Cleanser Byram Ancillary Kit - 15 Day Supply Discharge Instruction: Use supplies as instructed; Kit contains: (15) Saline Bullets; (15) 3x3  Gauze; 15 pr Gloves Peri-Wound Care Topical Primary Dressing Prisma 4.34 (in) Discharge Instruction: Moisten w/normal saline or sterile water; Cover wound as directed. Do not remove from wound bed. Secondary Dressing ABD  Pad 5x9 (in/in) Discharge Instruction: Cover with ABD pad Secured With Kerlix Roll Sterile or Non-Sterile 6-ply 4.5x4 (yd/yd) Discharge Instruction: Apply Kerlix as directed Compression Wrap Compression Stockings Add-Ons Electronic Signature(s) Signed: 06/30/2022 4:32:46 PM By: Rosalio Loud MSN RN CNS [address] Gwenlyn Perking ([id number]) [id number] Page 10 of 10 Entered By: Rosalio Loud on 06/30/2022 10:38:16 -------------------------------------------------------------------------------- Vitals Details Patient Name: Date of Service: EGE, MUCKEY 06/30/2022 10:00 A M Medical Record Number: [id number] Patient Account Number: [id number] Date of Birth/Sex: Treating RN: 12-Mar-1951 (71 y.o. Seward Meth Primary Care [name] [name]: Tracie Harrier Other Clinician: Referring Lyra Alaimo: Treating Murlene Revell/Extender: Solon Palm Weeks in Treatment: 2 Vital Signs Time Taken: 10:31 Temperature (F): 97.9 Height (in): 76 Pulse (bpm): 72 Weight (lbs): 208 Respiratory Rate (breaths/min): 16 Body Mass Index (BMI): 25.3 Blood Pressure (mmHg): 110/75 Reference Range: 80 - 120 mg / dl Electronic Signature(s) Signed: 06/30/2022 10:44:58 AM By: Rosalio Loud MSN RN CNS WTA Entered By: Rosalio Loud on 06/30/2022 10:44:57

## 2022-07-07 ENCOUNTER — Encounter: Payer: Medicare Other | Admitting: Physician Assistant

## 2022-07-07 DIAGNOSIS — I87311 Chronic venous hypertension (idiopathic) with ulcer of right lower extremity: Secondary | ICD-10-CM | POA: Diagnosis not present

## 2022-07-07 DIAGNOSIS — Z79899 Other long term (current) drug therapy: Secondary | ICD-10-CM | POA: Insufficient documentation

## 2022-07-07 NOTE — Progress Notes (Addendum)
Billy, Freeman ([id number]) [id number] Page 1 of 10 Visit Report for 07/07/2022 Arrival Information Details Patient Name: Date of Service: Billy, Freeman 07/07/2022 3:15 PM Medical Record Number: [id number] Patient Account Number: [id number] Date of Birth/Sex: Treating RN: June 14, 1951 (71 y.o. Billy Freeman Primary Care Billy Freeman: Billy Freeman Other Clinician: Referring Billy Freeman: Treating Billy Freeman/Extender: Billy Freeman: 3 Visit Information History Since Last Visit Added or deleted any medications: No Patient Arrived: Ambulatory Any new allergies or adverse reactions: No Arrival Time: 15:15 Had a fall or experienced change in No Accompanied By: self activities of daily living that may affect Transfer Assistance: None risk of falls: Patient Requires Transmission-Based Precautions: No Hospitalized since last visit: No Patient Has Alerts: Yes Implantable device outside of the clinic excluding No Patient Alerts: Patient on Blood Thinner cellular tissue based products placed in the center NOT Diabetic since last visit: Eliquis Pain Present Now: No Electronic Signature(s) Signed: 07/07/2022 3:51:57 PM By: Rosalio Loud MSN RN CNS WTA Entered By: Rosalio Loud on 07/07/2022 15:19:04 -------------------------------------------------------------------------------- Clinic Level of Care Assessment Details Patient Name: Date of Service: Billy, Freeman 07/07/2022 3:15 PM Medical Record Number: [id number] Patient Account Number: [id number] Date of Birth/Sex: Treating RN: 01-10-1951 (71 y.o. Billy Freeman Primary Care Pavle Wiler: Billy Freeman Other Clinician: Referring Aza Dantes: Treating Alhassan Everingham/Extender: Billy Freeman: 3 Clinic Level of Care Assessment Items TOOL 4 Quantity Score X- 1 0 Use when only an EandM is performed on FOLLOW-UP visit ASSESSMENTS - Nursing Assessment /  Reassessment X- 1 10 Reassessment of Co-morbidities (includes updates in patient status) X- 1 5 Reassessment of Adherence to Freeman Plan ASSESSMENTS - Wound and Skin A ssessment / Reassessment X - Simple Wound Assessment / Reassessment - one wound 1 [place], Billy Freeman ([id number]) [id number] Page 2 of 10 _0  - 0 Complex Wound Assessment / Reassessment - multiple wounds _1  - 0 Dermatologic / Skin Assessment (not related to wound area) ASSESSMENTS - Focused Assessment _2  - 0 Circumferential Edema Measurements - multi extremities _3  - 0 Nutritional Assessment / Counseling / Intervention _4  - 0 Lower Extremity Assessment (monofilament, tuning fork, pulses) _5  - 0 Peripheral Arterial Disease Assessment (using hand held doppler) ASSESSMENTS - Ostomy and/or Continence Assessment and Care _6  - 0 Incontinence Assessment and Management _7  - 0 Ostomy Care Assessment and Management (repouching, etc.) PROCESS - Coordination of Care X - Simple Patient / Family Education for ongoing care 1 15 _8  - 0 Complex (extensive) Patient / Family Education for ongoing care X- 1 10 Staff obtains Programmer, systems, Records, T Results / Process Orders est _9  - 0 Staff telephones HHA, Nursing Homes / Clarify orders / etc _10  - 0 Routine Transfer to another Facility (non-emergent condition) _11  - 0 Routine Hospital Admission (non-emergent condition) _12  - 0 New Admissions / Biomedical engineer / Ordering NPWT Apligraf, etc. , _13  - 0 Emergency Hospital Admission (emergent condition) X- 1 10 Simple Discharge Coordination _14  - 0 Complex (extensive) Discharge Coordination PROCESS - Special Needs _15  - 0 Pediatric / Minor Patient Management _16  - 0 Isolation Patient Management _17  - 0 Hearing / Language / Visual special needs _18  - 0 Assessment of Community assistance (transportation, D/C planning, etc.) _19  - 0 Additional assistance / Altered mentation _20  - 0 Support Surface(s)  Assessment (bed, cushion, seat, etc.) INTERVENTIONS - Wound Cleansing / Measurement X - Simple Wound Cleansing - one wound 1 5 _21  - 0 Complex Wound Cleansing - multiple wounds X- 1 5 Wound  Imaging (photographs - any number of wounds) _0  - 0 Wound Tracing (instead of photographs) X- 1 5 Simple Wound Measurement - one wound _1  - 0 Complex Wound Measurement - multiple wounds INTERVENTIONS - Wound Dressings X - Small Wound Dressing one or multiple wounds 1 10 _2  - 0 Medium Wound Dressing one or multiple wounds _3  - 0 Large Wound Dressing one or multiple wounds <ATFTDDUKGURKYHCW>_2<\/BJSEGBTDVVOHYWVP>_7  - 0 Application of Medications - topical <TGGYIRSWNIOEVOJJ>_0<\/KXFGHWEXHBZJIRCV>_8  - 0 Application of Medications - injection INTERVENTIONS - Miscellaneous _6  - 0 External ear exam _7  - 0 Specimen Collection (cultures, biopsies, blood, body fluids, etc.) Billy, Freeman ([id number]) [id number] Page 3 of 10 _8  - 0 Specimen(s) / Culture(s) sent or taken to Lab for analysis _9  - 0 Patient Transfer (multiple staff / Harrel Lemon Lift / Similar devices) _10  - 0 Simple Staple / Suture removal (25 or less) _11  - 0 Complex Staple / Suture removal (26 or more) _12  - 0 Hypo / Hyperglycemic Management (close monitor of Blood Glucose) _13  - 0 Ankle / Brachial Index (ABI) - do not check if billed separately X- 1 5 Vital Signs Has the patient been seen at the hospital within the last three years: Yes Total Score: 85 Level Of Care: New/Established - Level 3 Electronic Signature(s) Signed: 07/07/2022 3:51:57 PM By: Rosalio Loud MSN RN CNS WTA Entered By: Rosalio Loud on 07/07/2022 15:29:58 -------------------------------------------------------------------------------- Encounter Discharge Information Details Patient Name: Date of Service: Billy, Freeman 07/07/2022 3:15 PM Medical Record Number: [id number] Patient Account Number: [id number] Date of Birth/Sex: Treating RN: 1951-07-14 (71 y.o. Billy Freeman Primary Care Malanie Koloski: Billy Freeman  Other Clinician: Referring Emir Nack: Treating Prithvi Kooi/Extender: Billy Freeman: 3 Encounter Discharge Information Items Discharge Condition: Stable Ambulatory Status: Ambulatory Discharge Destination: Home Transportation: Private Auto Accompanied By: self Schedule Follow-up Appointment: Yes Clinical Summary of Care: Electronic Signature(s) Signed: 07/07/2022 3:51:57 PM By: Rosalio Loud MSN RN CNS WTA Entered By: Rosalio Loud on 07/07/2022 15:31:17 -------------------------------------------------------------------------------- Lower Extremity Assessment Details Patient Name: Date of Service: Billy, Freeman 07/07/2022 3:15 PM Medical Record Number: [id number] Patient Account Number: [id number] Date of Birth/Sex: Treating RN: 1951/01/19 (71 y.o. Marqueze, Ramcharan, Delcambre ([id number]) [id number] Page 4 of 10 Primary Care Noah Lembke: Billy Freeman Other Clinician: Referring Kesleigh Morson: Treating Aiman Noe/Extender: Nadara Mustard, Vishwanath Weeks in Freeman: 3 Edema Assessment Assessed: [Left: No] [Right: No] [Left: Edema] [Right: :] Calf Left: Right: Point of Measurement: 38 cm From Medial Instep 38.4 cm Ankle Left: Right: Point of Measurement: 11 cm From Medial Instep 24 cm Electronic Signature(s) Signed: 07/07/2022 3:51:57 PM By: Rosalio Loud MSN RN CNS WTA Entered By: Rosalio Loud on 07/07/2022 15:26:58 -------------------------------------------------------------------------------- Multi Wound Chart Details Patient Name: Date of Service: Billy, Freeman 07/07/2022 3:15 PM Medical Record Number: [id number] Patient Account Number: [id number] Date of Birth/Sex: Treating RN: 1951/06/05 (71 y.o. Billy Freeman Primary Care Fate Caster: Billy Freeman Other Clinician: Referring Ilir Mahrt: Treating Frederick Marro/Extender: Billy Freeman: 3 Vital Signs Height(in):  76 Pulse(bpm): 86 Weight(lbs): 208 Blood Pressure(mmHg): 117/78 Body Mass Index(BMI): 25.3 Temperature(F): 97.7 Respiratory Rate(breaths/min): 16 [1:Photos:] [N/A:N/A] Right, Medial Lower Leg N/A N/A Wound Location: Laceration N/A N/A Wounding Event: Venous Leg Ulcer N/A N/A Primary Etiology: Chronic Obstructive Pulmonary N/A N/A Comorbid History: Disease (COPD), Hypertension 05/03/2022 N/A N/A Date Acquired: 3 N/A N/A Weeks of Freeman: Open N/A N/A Wound Status: No N/A N/A Wound Recurrence: 1x1.2x0.2 N/A N/A Measurements L x W x D (cm) 0.942 N/A N/A A (cm) :  rea 0.188 N/A N/A Volume (cm) Billy, Freeman ([id number]) [id number] Page 5 of 10 55.90% N/A N/A % Reduction in Area: 70.70% N/A N/A % Reduction in Volume: Full Thickness Without Exposed N/A N/A Classification: Support Structures Medium N/A N/A Exudate Amount: Serosanguineous N/A N/A Exudate Type: red, brown N/A N/A Exudate Color: Small (1-33%) N/A N/A Granulation Amount: Red N/A N/A Granulation Quality: Small (1-33%) N/A N/A Necrotic Amount: Fat Layer (Subcutaneous Tissue): Yes N/A N/A Exposed Structures: Fascia: No Tendon: No Muscle: No Joint: No Bone: No Small (1-33%) N/A N/A Epithelialization: Freeman Notes Electronic Signature(s) Signed: 07/07/2022 3:51:57 PM By: Rosalio Loud MSN RN CNS WTA Entered By: Rosalio Loud on 07/07/2022 15:29:03 -------------------------------------------------------------------------------- Multi-Disciplinary Care Plan Details Patient Name: Date of Service: Billy, Freeman 07/07/2022 3:15 PM Medical Record Number: [id number] Patient Account Number: [id number] Date of Birth/Sex: Treating RN: [birth year] (71 y.o. Billy Freeman Primary Care Arshia Rondon: Billy Freeman Other Clinician: Referring Topaz Raglin: Treating Lewellyn Fultz/Extender: Billy Freeman: 3 Active Inactive Necrotic Tissue Nursing  Diagnoses: Impaired tissue integrity related to necrotic/devitalized tissue Knowledge deficit related to management of necrotic/devitalized tissue Goals: Necrotic/devitalized tissue will be minimized in the wound bed Date Initiated: 06/13/2022 Target Resolution Date: 07/13/2022 Goal Status: Active Patient/caregiver will verbalize understanding of reason and process for debridement of necrotic tissue Date Initiated: 06/13/2022 Target Resolution Date: 07/13/2022 Goal Status: Active Interventions: Assess patient pain level pre-, during and post procedure and prior to discharge Provide education on necrotic tissue and debridement process Freeman Activities: Apply topical anesthetic as ordered : 06/13/2022 Excisional debridement : 06/13/2022 Notes: Orientation to the [place], Billy Freeman ([id number]) [id number] Page 6 of 10 Nursing Diagnoses: Knowledge deficit related to the wound healing center program Goals: Patient/caregiver will verbalize understanding of the [place] Date Initiated: 06/13/2022 Target Resolution Date: 06/27/2022 Goal Status: Active Interventions: Provide education on orientation to the wound center Notes: Venous Leg Ulcer Nursing Diagnoses: Potential for venous Insuffiency (use before diagnosis confirmed) Goals: Patient will maintain optimal edema control Date Initiated: 06/13/2022 Target Resolution Date: 07/13/2022 Goal Status: Active Patient/caregiver will verbalize understanding of disease process and disease management Date Initiated: 06/13/2022 Target Resolution Date: 07/13/2022 Goal Status: Active Verify adequate tissue perfusion prior to therapeutic compression application Date Initiated: 06/13/2022 Target Resolution Date: 06/13/2022 Goal Status: Active Interventions: Assess peripheral edema status every visit. Compression as ordered Provide education on venous insufficiency Freeman  Activities: Therapeutic compression applied : 06/13/2022 Notes: Wound/Skin Impairment Nursing Diagnoses: Impaired tissue integrity Knowledge deficit related to ulceration/compromised skin integrity Goals: Patient will demonstrate a reduced rate of smoking or cessation of smoking Date Initiated: 06/13/2022 Target Resolution Date: 07/13/2022 Goal Status: Active Patient will have a decrease in wound volume by X% from date: (specify in notes) Date Initiated: 06/13/2022 Target Resolution Date: 07/13/2022 Goal Status: Active Patient/caregiver will verbalize understanding of skin care regimen Date Initiated: 06/13/2022 Target Resolution Date: 07/13/2022 Goal Status: Active Ulcer/skin breakdown will have a volume reduction of 30% by week 4 Date Initiated: 06/13/2022 Target Resolution Date: 07/13/2022 Goal Status: Active Ulcer/skin breakdown will have a volume reduction of 50% by week 8 Date Initiated: 06/13/2022 Target Resolution Date: 08/13/2022 Goal Status: Active Ulcer/skin breakdown will have a volume reduction of 80% by week 12 Date Initiated: 06/13/2022 Target Resolution Date: 09/13/2022 Goal Status: Active Ulcer/skin breakdown will heal within 14 weeks Date Initiated: 06/13/2022 Target Resolution Date: 09/27/2022 Goal Status: Active Interventions: Assess patient/caregiver ability to obtain necessary supplies Assess patient/caregiver ability to perform ulcer/skin care regimen  upon admission and as needed Assess ulceration(s) every visit Provide education on ulcer and skin care Notes: Billy, Freeman ([id number]) [id number] Page 7 of 10 Electronic Signature(s) Signed: 07/07/2022 3:51:57 PM By: Rosalio Loud MSN RN CNS WTA Entered By: Rosalio Loud on 07/07/2022 15:30:27 -------------------------------------------------------------------------------- Pain Assessment Details Patient Name: Date of Service: Billy, Freeman 07/07/2022 3:15 PM Medical Record  Number: [id number] Patient Account Number: [id number] Date of Birth/Sex: Treating RN: [birth year] (71 y.o. Billy Freeman Primary Care Cyara Devoto: Billy Freeman Other Clinician: Referring Scotty Weigelt: Treating Keniya Schlotterbeck/Extender: Billy Freeman: 3 Active Problems Location of Pain Severity and Description of Pain Patient Has Paino No Site Locations Pain Management and Medication Current Pain Management: Electronic Signature(s) Signed: 07/07/2022 3:51:57 PM By: Rosalio Loud MSN RN CNS WTA Entered By: Rosalio Loud on 07/07/2022 15:21:29 -------------------------------------------------------------------------------- Patient/Caregiver Education Details Patient Name: Date of Service: Billy Freeman 12/11/2023andnbsp3:15 PM [place] ([id number]) [id number] Page 8 of 10 Medical Record Number: [id number] Patient Account Number: [id number] Date of Birth/Gender: Treating RN: 24-Mar-1951 (71 y.o. Billy Freeman Primary Care Physician: Billy Freeman Other Clinician: Referring Physician: Treating Physician/Extender: Billy Freeman: 3 Education Assessment Education Provided To: Patient Education Topics Provided Wound/Skin Impairment: Handouts: Caring for Your Ulcer Methods: Explain/Verbal Responses: State content correctly Electronic Signature(s) Signed: 07/07/2022 3:51:57 PM By: Rosalio Loud MSN RN CNS WTA Entered By: Rosalio Loud on 07/07/2022 15:30:21 -------------------------------------------------------------------------------- Wound Assessment Details Patient Name: Date of Service: Billy, Freeman 07/07/2022 3:15 PM Medical Record Number: [id number] Patient Account Number: [id number] Date of Birth/Sex: Treating RN: December 28, 1950 (71 y.o. Billy Freeman Primary Care Janasia Coverdale: Billy Freeman Other Clinician: Referring Salimata Christenson: Treating Jaiven Graveline/Extender: Billy Freeman: 3 Wound Status Wound Number: 1 Primary Etiology: Venous Leg Ulcer Wound Location: Right, Medial Lower Leg Wound Status: Open Wounding Event: Laceration Comorbid Chronic Obstructive Pulmonary Disease (COPD), History: Hypertension Date Acquired: 05/03/2022 Weeks Of Freeman: 3 Clustered Wound: No Photos Wound Measurements Length: (cm) 1 Width: (cm) 1.2 Depth: (cm) 0.2 Area: (cm) 0.9 Volume: (cm) 0.1 Hatchel, Pershing ([id number]) % Reduction in Area: 55.9% % Reduction in Volume: 70.7% Epithelialization: Small (1-33%) 42 88 [id number] Page 9 of 10 Wound Description Classification: Full Thickness Without Exposed Suppor Exudate Amount: Medium Exudate Type: Serosanguineous Exudate Color: red, brown t Structures Foul Odor After Cleansing: No Slough/Fibrino Yes Wound Bed Granulation Amount: Small (1-33%) Exposed Structure Granulation Quality: Red Fascia Exposed: No Necrotic Amount: Small (1-33%) Fat Layer (Subcutaneous Tissue) Exposed: Yes Necrotic Quality: Adherent Slough Tendon Exposed: No Muscle Exposed: No Joint Exposed: No Bone Exposed: No Freeman Notes Wound #1 (Lower Leg) Wound Laterality: Right, Medial Cleanser [place] Ancillary Kit - 15 Day Supply Discharge Instruction: Use supplies as instructed; Kit contains: (15) Saline Bullets; (15) 3x3 Gauze; 15 pr Gloves Peri-Wound Care Topical Primary Dressing Prisma 4.34 (in) Discharge Instruction: Moisten w/normal saline or sterile water; Cover wound as directed. Do not remove from wound bed. Secondary Dressing ABD Pad 5x9 (in/in) Discharge Instruction: Cover with ABD pad Secured With Kerlix Roll Sterile or Non-Sterile 6-ply 4.5x4 (yd/yd) Discharge Instruction: Apply Kerlix as directed Compression Wrap Compression Stockings Add-Ons Electronic Signature(s) Signed: 07/07/2022 3:51:57 PM By: Rosalio Loud MSN RN CNS WTA Entered By: Rosalio Loud on 07/07/2022  15:25:41 -------------------------------------------------------------------------------- [place] Details Patient Name: Date of Service: Billy, Freeman 07/07/2022 3:15 PM Medical Record Number: [id number] Patient Account Number: [id number] Date of Birth/Sex: Treating RN: Oct 02, 1950 (71 y.o. Billy Freeman Primary Care Tyler Cubit: Billy Freeman Other  Clinician: Referring Christmas Faraci: Treating Marshelle Bilger/Extender: Nadara Mustard, Vishwanath Weeks in Freeman: [place], Billy Freeman ([id number]) [id number] Page 10 of 10 Time Taken: 15:19 Temperature (F): 97.7 Height (in): 76 Pulse (bpm): 86 Weight (lbs): 208 Respiratory Rate (breaths/min): 16 Body Mass Index (BMI): 25.3 Blood Pressure (mmHg): 117/78 Reference Range: 80 - 120 mg / dl Electronic Signature(s) Signed: 07/07/2022 3:51:57 PM By: Rosalio Loud MSN RN CNS WTA Entered By: Rosalio Loud on 07/07/2022 15:21:23

## 2022-07-07 NOTE — Progress Notes (Signed)
Billy, GATLIFF ([id number]) [id number] Page 1 of 6 Visit Report for 07/07/2022 Chief Complaint Document Details Patient Name: Date of Service: Billy Freeman, Billy Freeman 07/07/2022 3:15 PM Medical Record Number: [id number] Patient Account Number: [id number] Date of Birth/Sex: Treating RN: [birth year] (71 y.o. Seward Meth Primary Care Provider: Tracie Harrier Other Clinician: Referring Provider: Treating Provider/Extender: Solon Palm Weeks in Treatment: 3 Information Obtained from: Patient Chief Complaint Right LE Ulcer Electronic Signature(s) Signed: 07/07/2022 3:15:05 PM By: Worthy Keeler PA-C Entered By: Worthy Keeler on 07/07/2022 15:15:05 -------------------------------------------------------------------------------- HPI Details Patient Name: Date of Service: Billy, Freeman 07/07/2022 3:15 PM Medical Record Number: [id number] Patient Account Number: [id number] Date of Birth/Sex: Treating RN: [birth year] (71 y.o. Seward Meth Primary Care Provider: Tracie Harrier Other Clinician: Referring Provider: Treating Provider/Extender: Solon Palm Weeks in Treatment: 3 History of Present Illness HPI Description: 06-13-2022 upon evaluation today patient appears to be doing poorly currently in regard to the wound on his leg. He subsequently tells me that he hit this on a piece of wood when he was putting something together this was around December 7. Subsequently he states that since that time he has been having issues with getting it to heal at 1 point he believes it was likely infected it was hurting quite severely. He ended up actually switching to bacitracin ointment at that point he tells me a lot of the severe pain improved following. With that being said he tells me that with what we are seeing here currently that he was still having a lot of issues with getting this to close and therefore wanted to come into the wound  care center as he has had a good experience with his mother before going to the wound care center in [place] where they were able to get the wound healed that otherwise had been recommended may require amputation. He has been in the hospital for his heart and tells me that during that time he had been previously that using Neosporin and a Band-Aid. In the hospital he was told to use Medihoney and a silver alginate to cover. With that being said he tells me that it really has not helped tremendously he eventually went to using the bacitracin which has been more recent. Patient does have a history of what appears to be venous insufficiency of the lower extremities he tells me has been trying to elevate his legs much as possible. He also has a history of atrial fibrillation, long-term use of anticoagulant therapy due to this he is on Eliquis, he does have hypertension, and COPD. He is also more recently been in the hospital for his heart he has a heart monitor on even now. 06-30-2022 upon evaluation today patient appears to be doing well currently in regard to his wound he seems to be making good progress which is great news. I see no signs of infection. Billy, Freeman ([id number]) [id number] Page 2 of 6 07-07-2022 upon evaluation today patient appears to be doing excellent in regard to his wound. He has been tolerating the dressing changes without complication. Fortunately there does not appear to be any signs of active infection locally or systemically at this time which is great news. No fevers, chills, nausea, vomiting, or diarrhea. Electronic Signature(s) Signed: 07/07/2022 3:33:06 PM By: Worthy Keeler PA-C Entered By: Worthy Keeler on 07/07/2022 15:33:06 -------------------------------------------------------------------------------- Physical Exam Details Patient Name: Date of Service: Billy, Freeman 07/07/2022 3:15 PM Medical Record Number: [id number] Patient  Account  Number: [id number] Date of Birth/Sex: Treating RN: 1951-04-15 (71 y.o. Seward Meth Primary Care Provider: Tracie Harrier Other Clinician: Referring Provider: Treating Provider/Extender: Solon Palm Weeks in Treatment: 3 Constitutional Well-nourished and well-hydrated in no acute distress. Respiratory normal breathing without difficulty. Psychiatric this patient is able to make decisions and demonstrates good insight into disease process. Alert and Oriented x 3. pleasant and cooperative. Notes Upon inspection patient's wound bed showed evidence of excellent granulation and epithelization actually very pleased with where we stand I do believe he is making good progress here this is measuring smaller yet again today and again we have not had actually debrided this since the first visit overall I am extremely happy with where we stand. Electronic Signature(s) Signed: 07/07/2022 3:33:30 PM By: Worthy Keeler PA-C Entered By: Worthy Keeler on 07/07/2022 15:33:30 -------------------------------------------------------------------------------- Physician Orders Details Patient Name: Date of Service: Billy, Freeman 07/07/2022 3:15 PM Medical Record Number: [id number] Patient Account Number: [id number] Date of Birth/Sex: Treating RN: [birth year] (71 y.o. Seward Meth Primary Care Provider: Tracie Harrier Other Clinician: Referring Provider: Treating Provider/Extender: Solon Palm Weeks in Treatment: 3 Verbal / Phone Orders: No Diagnosis [address] 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 - Wash with Vashe. If burns too bad then use antibacterial soap Anesthetic (Use 'Patient Medications' Section for Anesthetic Order Entry) Lidocaine applied to wound bed Wound Treatment Wound #1 - Lower Leg Wound Laterality: Right, Medial Cleanser: Byram Ancillary Kit - 15 Day Supply (Generic) 3 x Per Week/30 Days Discharge Instructions: Use supplies as instructed; Kit contains: (15) Saline Bullets; (15) 3x3 Gauze; 15 pr Gloves Prim Dressing: Prisma 4.34 (in) (Dispense As Written) 3 x Per Week/30 Days ary Discharge Instructions: Moisten w/normal saline or sterile water; Cover wound as directed. Do not remove from wound bed. Secondary Dressing: ABD Pad 5x9 (in/in) (Generic) 3 x Per Week/30 Days Discharge Instructions: Cover with ABD pad Secured With: Kerlix Roll Sterile or Non-Sterile 6-ply 4.5x4 (yd/yd) (Generic) 3 x Per Week/30 Days Discharge Instructions: Apply Kerlix as directed Electronic Signature(s) Signed: 07/07/2022 3:51:57 PM By: Rosalio Loud MSN RN CNS WTA Signed: 07/07/2022 5:07:14 PM By: Worthy Keeler PA-C Entered By: Rosalio Loud on 07/07/2022 15:29:23 -------------------------------------------------------------------------------- Problem List Details Patient Name: Date of Service: DAISEAN, BRODHEAD 07/07/2022 3:15 PM Medical Record Number: [id number] Patient Account Number: [id number] Date of Birth/Sex: Treating RN: [birth year] (71 y.o. Seward Meth Primary Care Provider: Tracie Harrier Other Clinician: Referring Provider: Treating Provider/Extender: Solon Palm Weeks in Treatment: 3 Active Problems ICD-10 Encounter Code Description Active Date MDM Diagnosis I87.311 Chronic venous hypertension (idiopathic) with ulcer of  right lower extremity 06/13/2022 No Yes S81.811A Laceration without foreign body, right lower leg, initial encounter 06/13/2022 No Yes JAHSIAH, CARPENTER ([id number]) [id number] Page 4 of 6 [id number] Non-pressure chronic ulcer of other part of right lower leg with fat layer 06/13/2022 No Yes exposed I48.0 Paroxysmal atrial fibrillation 06/13/2022 No Yes Z79.01 Long term (current) use of anticoagulants 06/13/2022 No Yes I10 Essential (primary) hypertension 06/13/2022 No Yes J44.9 Chronic obstructive pulmonary disease,  unspecified 06/13/2022 No Yes Inactive Problems Resolved Problems Electronic Signature(s) Signed: 07/07/2022 3:14:59 PM By: Worthy Keeler PA-C Entered By: Worthy Keeler on 07/07/2022 15:14:58 -------------------------------------------------------------------------------- Progress Note Details Patient Name: Date of Service: EUNICE, OLDAKER 07/07/2022 3:15 PM Medical Record Number: [id number] Patient Account Number: [id number] Date of Birth/Sex: Treating RN: 07/19/1951 (71 y.o. Seward Meth Primary Care Provider: Tracie Harrier Other Clinician: Referring Provider: Treating Provider/Extender: Solon Palm Weeks in Treatment: 3 Subjective Chief Complaint Information obtained from Patient Right LE Ulcer History of Present Illness (HPI) 06-13-2022 upon evaluation today patient appears to be doing poorly currently in regard to the wound on his leg. He subsequently tells me that he hit this on a piece of wood when he was putting something together this was around December 7. Subsequently he states that since that time he has been having issues with getting it to heal at 1 point he believes it was likely infected it was hurting quite severely. He ended up actually switching to bacitracin ointment at that point he tells me a lot of the severe pain improved following. With that being said he tells me that with what we are seeing here  currently that he was still having a lot of issues with getting this to close and therefore wanted to come into the wound care center as he has had a good experience with his mother before going to the wound care center in [place] where they were able to get the wound healed that otherwise had been recommended may require amputation. He has been in the hospital for his heart and tells me that during that time he had been previously that using Neosporin and a Band-Aid. In the hospital he was told to use Medihoney and a silver alginate to cover. With that being said he tells me that it really has not helped tremendously he eventually went to using the bacitracin which has been more recent. Patient does have a history of what appears to be venous insufficiency of the lower extremities he tells me has been trying to elevate his legs much as possible. He also has a history of atrial fibrillation, long-term use of anticoagulant therapy due to this he is on Eliquis, he does have hypertension, and COPD. He is also more recently been in the hospital for his heart he has a heart monitor on even now. 06-30-2022 upon evaluation today patient appears to be doing well currently in regard to his wound he seems to be making good progress which is great news. I see no signs of infection. 07-07-2022 upon evaluation today patient appears to be doing excellent in regard to his wound. He has been tolerating the dressing changes without complication. Fortunately there does not appear to be any signs of active infection locally or systemically at this time which is great news. No fevers, chills, AKARI, CRYSLER ([id number]) [id number] Page 5 of 6 nausea, vomiting, or diarrhea. Objective Constitutional Well-nourished and well-hydrated in no acute distress. Vitals Time Taken: 3:19 PM, Height: 76 in, Weight: 208 lbs, BMI: 25.3, Temperature: 97.7 F, Pulse: 86 bpm, Respiratory Rate: 16 breaths/min,  Blood Pressure: 117/78 mmHg. Respiratory normal breathing without difficulty. Psychiatric this patient is able to make decisions and demonstrates good insight into disease process. Alert and Oriented x 3. pleasant and cooperative. General Notes: Upon inspection patient's wound bed showed evidence of excellent granulation and epithelization actually very pleased with where we stand I do believe he is making good progress here this is  measuring smaller yet again today and again we have not had actually debrided this since the first visit overall I am extremely happy with where we stand. Integumentary (Hair, Skin) Wound #1 status is Open. Original cause of wound was Laceration. The date acquired was: 05/03/2022. The wound has been in treatment 3 weeks. The wound is located on the Right,Medial Lower Leg. The wound measures 1cm length x 1.2cm width x 0.2cm depth; 0.942cm^2 area and 0.188cm^3 volume. There is Fat Layer (Subcutaneous Tissue) exposed. There is a medium amount of serosanguineous drainage noted. There is small (1-33%) red granulation within the wound bed. There is a small (1-33%) amount of necrotic tissue within the wound bed including Adherent Slough. Assessment Active Problems ICD-10 Chronic venous hypertension (idiopathic) with ulcer of right lower extremity Laceration without foreign body, right lower leg, initial encounter Non-pressure chronic ulcer of other part of right lower leg with fat layer exposed Paroxysmal atrial fibrillation Long term (current) use of anticoagulants Essential (primary) hypertension Chronic obstructive pulmonary disease, unspecified Plan Bathing/ Shower/ Hygiene: Wash wounds with antibacterial soap and water. - Wash with Vashe. If burns too bad then use antibacterial soap Anesthetic (Use 'Patient Medications' Section for Anesthetic Order Entry): Lidocaine applied to wound bed WOUND #1: - Lower Leg Wound Laterality: Right, Medial Cleanser: Byram  Ancillary Kit - 15 Day Supply (Generic) 3 x Per Week/30 Days Discharge Instructions: Use supplies as instructed; Kit contains: (15) Saline Bullets; (15) 3x3 Gauze; 15 pr Gloves Prim Dressing: Prisma 4.34 (in) (Dispense As Written) 3 x Per Week/30 Days ary Discharge Instructions: Moisten w/normal saline or sterile water; Cover wound as directed. Do not remove from wound bed. Secondary Dressing: ABD Pad 5x9 (in/in) (Generic) 3 x Per Week/30 Days Discharge Instructions: Cover with ABD pad Secured With: Kerlix Roll Sterile or Non-Sterile 6-ply 4.5x4 (yd/yd) (Generic) 3 x Per Week/30 Days Discharge Instructions: Apply Kerlix as directed 1. I am going to suggest that we have the patient continue to monitor for any signs of infection or worsening. Obviously if anything changes he knows in contact the office and let me know. 2. I am also going to recommend that we continue specifically with the Prisma followed by the ABD pad and roll gauze to secure in place. 3. We will continue with the Tubigrip he seems to be doing well with this is keeping the edema under good control and to be honest I do think we could wrap his leg which would help more with compression but has been doing so well right now with the Tubigrip and changing this more frequently that I feel like continuing down that path will be the right thing to go with at this point. If anything changes we will make any adjustments as necessary. We will see patient back for reevaluation in 1 week here in the clinic. If anything worsens or changes patient will contact our office for additional recommendations. JESSEE, MEZERA ([id number]) [id number] Page 6 of 6 Electronic Signature(s) Signed: 07/07/2022 3:34:18 PM By: Worthy Keeler PA-C Entered By: Worthy Keeler on 07/07/2022 15:34:18 -------------------------------------------------------------------------------- SuperBill Details Patient Name: Date of Service: OLON, RUSS 07/07/2022 Medical Record Number: [id number] Patient Account Number: [id number] Date of Birth/Sex: Treating RN: 1951-03-29 (71 y.o. Seward Meth Primary Care Provider: Tracie Harrier Other Clinician: Referring Provider: Treating Provider/Extender: Solon Palm Weeks in Treatment: 3 Diagnosis Coding ICD-10 Codes Code Description I87.311 Chronic venous hypertension (idiopathic) with ulcer of right lower extremity S81.811A Laceration without foreign body, right lower  leg, initial encounter L97.812 Non-pressure chronic ulcer of other part of right lower leg with fat layer exposed I48.0 Paroxysmal atrial fibrillation Z79.01 Long term (current) use of anticoagulants I10 Essential (primary) hypertension J44.9 Chronic obstructive pulmonary disease, unspecified Facility Procedures : CPT4 Code: [id number] Description: 10034 - WOUND CARE VISIT-LEV 3 EST PT Modifier: Quantity: 1 Physician Procedures : CPT4 Code Description Modifier [id number] - WC PHYS LEVEL 3 - EST PT ICD-10 Diagnosis Description I87.311 Chronic venous hypertension (idiopathic) with ulcer of right lower extremity S81.811A Laceration without foreign body, right lower leg, initial  encounter D39.122 Non-pressure chronic ulcer of other part of right lower leg with fat layer exposed I48.0 Paroxysmal atrial fibrillation Quantity: 1 Electronic Signature(s) Signed: 07/07/2022 3:34:31 PM By: Worthy Keeler PA-C Entered By: Worthy Keeler on 07/07/2022 15:34:31

## 2022-07-14 ENCOUNTER — Encounter: Payer: Medicare Other | Admitting: Physician Assistant

## 2022-07-14 DIAGNOSIS — I87311 Chronic venous hypertension (idiopathic) with ulcer of right lower extremity: Secondary | ICD-10-CM | POA: Diagnosis not present

## 2022-07-14 NOTE — Progress Notes (Signed)
Billy Freeman ([id number]) [id number] Page 1 of 6 Visit Report for 07/14/2022 Chief Complaint Document Details Patient Name: Date of Service: Billy Freeman, Billy Freeman 07/14/2022 7:45 A M Medical Record Number: [id number] Patient Account Number: [id number] Date of Birth/Sex: Treating RN: July 04, 1951 (71 y.o. Billy Freeman Primary Care Provider: Tracie Freeman Other Clinician: Referring Provider: Treating Provider/Extender: Billy Freeman Weeks in Treatment: 4 Information Obtained from: Patient Chief Complaint Right LE Ulcer Electronic Signature(s) Signed: 07/14/2022 8:00:36 AM By: Billy Keeler PA-C Entered By: Billy Freeman on 07/14/2022 08:00:35 -------------------------------------------------------------------------------- HPI Details Patient Name: Date of Service: Billy Freeman, Billy Freeman 07/14/2022 7:45 A M Medical Record Number: [id number] Patient Account Number: [id number] Date of Birth/Sex: Treating RN: 09/08/1950 (71 y.o. Billy Freeman Primary Care Provider: Tracie Freeman Other Clinician: Referring Provider: Treating Provider/Extender: Billy Freeman Weeks in Treatment: 4 History of Present Illness HPI Description: 06-13-2022 upon evaluation today patient appears to be doing poorly currently in regard to the wound on his leg. He subsequently tells me that he hit this on a piece of wood when he was putting something together this was around December 7. Subsequently he states that since that time he has been having issues with getting it to heal at 1 point he believes it was likely infected it was hurting quite severely. He ended up actually switching to bacitracin ointment at that point he tells me a lot of the severe pain improved following. With that being said he tells me that with what we are seeing here currently that he was still having a lot of issues with getting this to close and therefore wanted to come into the  wound care center as he has had a good experience with his mother before going to the wound care center in [place] where they were able to get the wound healed that otherwise had been recommended may require amputation. He has been in the hospital for his heart and tells me that during that time he had been previously that using Neosporin and a Band-Aid. In the hospital he was told to use Medihoney and a silver alginate to cover. With that being said he tells me that it really has not helped tremendously he eventually went to using the bacitracin which has been more recent. Patient does have a history of what appears to be venous insufficiency of the lower extremities he tells me has been trying to elevate his legs much as possible. He also has a history of atrial fibrillation, long-term use of anticoagulant therapy due to this he is on Eliquis, he does have hypertension, and COPD. He is also more recently been in the hospital for his heart he has a heart monitor on even now. 06-30-2022 upon evaluation today patient appears to be doing well currently in regard to his wound he seems to be making good progress which is great news. I see no signs of infection. Billy Freeman, Billy Freeman ([id number]) [id number] Page 2 of 6 07-07-2022 upon evaluation today patient appears to be doing excellent in regard to his wound. He has been tolerating the dressing changes without complication. Fortunately there does not appear to be any signs of active infection locally or systemically at this time which is great news. No fevers, chills, nausea, vomiting, or diarrhea. 07-14-2022 upon evaluation today patient appears to be doing excellent in regard to his wound. He has been tolerating the dressing changes without complication. This actually seems to be doing very well at this point. Electronic Signature(s) Signed: 07/14/2022  8:45:07 AM By: Billy Keeler PA-C Entered By: Billy Freeman on 07/14/2022  08:45:07 -------------------------------------------------------------------------------- Physical Exam Details Patient Name: Date of Service: Billy Freeman 07/14/2022 7:45 A M Medical Record Number: [id number] Patient Account Number: [id number] Date of Birth/Sex: Treating RN: 1950/08/12 (71 y.o. Billy Freeman Primary Care Provider: Tracie Freeman Other Clinician: Referring Provider: Treating Provider/Extender: Billy Freeman Weeks in Treatment: 4 Constitutional Well-nourished and well-hydrated in no acute distress. Respiratory normal breathing without difficulty. Psychiatric this patient is able to make decisions and demonstrates good insight into disease process. Alert and Oriented x 3. pleasant and cooperative. Notes Patient's wound bed showed signs of good granulation epithelization at this point. Fortunately I see no evidence of active infection locally nor systemically which is great news and overall I am extremely pleased with where we stand. I do believe that he is doing great changing this at home. Electronic Signature(s) Signed: 07/14/2022 8:45:18 AM By: Billy Keeler PA-C Entered By: Billy Freeman on 07/14/2022 08:45:17 -------------------------------------------------------------------------------- Physician Orders Details Patient Name: Date of Service: Billy Freeman, Billy Freeman 07/14/2022 7:45 A M Medical Record Number: [id number] Patient Account Number: [id number] Date of Birth/Sex: Treating RN: 12-26-1950 (71 y.o. 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 - Wash with Vashe. If burns too bad then use antibacterial soap Anesthetic (Use 'Patient Medications' Section for Anesthetic Order Entry) Lidocaine applied to wound bed Wound Treatment Wound #1 - Lower Leg Wound Laterality: Right, Medial Cleanser: Byram Ancillary Kit - 15 Day Supply (Generic) 3 x Per Week/30 Days Discharge Instructions: Use supplies as instructed; Kit contains: (15) Saline Bullets; (15) 3x3 Gauze; 15 pr Gloves Prim Dressing: Prisma 4.34 (in) (Dispense As Written) 3 x Per Week/30 Days ary Discharge Instructions: Moisten w/normal saline or sterile water; Cover wound as directed. Do not remove from wound bed. Secondary Dressing: ABD Pad 5x9 (in/in) (Generic) 3 x Per Week/30 Days Discharge Instructions: Cover with ABD pad Secured With: Kerlix Roll Sterile or Non-Sterile 6-ply 4.5x4 (yd/yd) (Generic) 3 x Per Week/30 Days Discharge Instructions: Apply Kerlix as directed Electronic Signature(s) Signed: 07/14/2022 4:45:38 PM By: Billy Keeler PA-C Signed: 07/14/2022 5:08:17 PM By: Rosalio Loud MSN RN CNS WTA Entered By: Rosalio Loud on 07/14/2022 08:02:21 -------------------------------------------------------------------------------- Problem List Details Patient Name: Date of Service: Billy Freeman, Billy Freeman 07/14/2022 7:45 A M Medical Record Number: [id number] Patient Account Number: [id number] Date of Birth/Sex: Treating RN: October 25, 1950 (71 y.o. Billy Freeman Primary Care Provider: Tracie Freeman Other Clinician: Referring Provider: Treating Provider/Extender: Billy Freeman Weeks in Treatment: 4 Active Problems ICD-10 Encounter Code Description Active Date MDM Diagnosis I87.311 Chronic venous hypertension (idiopathic) with ulcer of right lower extremity 06/13/2022 No Yes S81.811A Laceration without foreign body, right lower leg, initial encounter 06/13/2022 No Yes JEWELZ, KOBUS ([id number]) [id number] Page 4 of 6 [id number] Non-pressure chronic ulcer of other part of right lower leg with fat layer 06/13/2022  No Yes exposed I48.0 Paroxysmal atrial fibrillation 06/13/2022 No Yes Z79.01 Long term (current) use of anticoagulants 06/13/2022 No Yes I10 Essential (primary) hypertension 06/13/2022 No Yes J44.9 Chronic obstructive pulmonary disease, unspecified 06/13/2022 No Yes Inactive Problems Resolved Problems Electronic Signature(s) Signed: 07/14/2022 4:45:38 PM By: Billy Keeler PA-C Signed: 07/14/2022 5:08:17 PM By: Rosalio Loud MSN RN CNS WTA Previous Signature: 07/14/2022 8:00:29 AM Version By: Billy Keeler PA-C Entered By: Rosalio Loud on 07/14/2022 08:04:49 -------------------------------------------------------------------------------- Progress Note Details Patient Name: Date of Service: Billy Freeman, Billy Freeman 07/14/2022 7:45 A M Medical Record Number: [id number] Patient Account Number: [id number] Date of Birth/Sex: Treating RN: March 15, 1951 (71 y.o. Billy Freeman Primary Care Provider: Tracie Freeman Other Clinician: Referring Provider: Treating Provider/Extender: Billy Freeman Weeks in Treatment: 4 Subjective Chief Complaint Information obtained from Patient Right LE Ulcer History of Present Illness (HPI) 06-13-2022 upon evaluation today patient appears to be doing poorly currently in regard to the wound on his leg. He subsequently tells me that he hit this on a piece of wood when he was putting something together this was around December 7. Subsequently he states that since that time he has  been having issues with getting it to heal at 1 point he believes it was likely infected it was hurting quite severely. He ended up actually switching to bacitracin ointment at that point he tells me a lot of the severe pain improved following. With that being said he tells me that with what we are seeing here currently that he was still having a lot of issues with getting this to close and therefore wanted to come into the wound care center as he has had a good experience with his mother before going to the wound care center in [place] where they were able to get the wound healed that otherwise had been recommended may require amputation. He has been in the hospital for his heart and tells me that during that time he had been previously that using Neosporin and a Band-Aid. In the hospital he was told to use Medihoney and a silver alginate to cover. With that being said he tells me that it really has not helped tremendously he eventually went to using the bacitracin which has been more recent. Patient does have a history of what appears to be venous insufficiency of the lower extremities he tells me has been trying to elevate his legs much as possible. He also has a history of atrial fibrillation, long-term use of anticoagulant therapy due to this he is on Eliquis, he does have hypertension, and COPD. He is also more recently been in the hospital for his heart he has a heart monitor on even now. 06-30-2022 upon evaluation today patient appears to be doing well currently in regard to his wound he seems to be making good progress which is great news. Billy Freeman, Billy Freeman ([id number]) [id number] Page 5 of 6 see no signs of infection. 07-07-2022 upon evaluation today patient appears to be doing excellent in regard to his wound. He has been tolerating the dressing changes without complication. Fortunately there does not appear to be any signs of active infection locally or systemically  at this time which is great news. No fevers, chills, nausea, vomiting, or diarrhea. 07-14-2022 upon evaluation today patient appears to be doing excellent in regard to his wound. He has been tolerating the dressing changes without complication. This actually seems to be doing very well at this point. Objective Constitutional Well-nourished and well-hydrated in no  acute distress. Vitals Time Taken: 7:50 AM, Height: 76 in, Weight: 208 lbs, BMI: 25.3, Temperature: 97.8 F, Pulse: 61 bpm, Respiratory Rate: 16 breaths/min, Blood Pressure: 113/74 mmHg. Respiratory normal breathing without difficulty. Psychiatric this patient is able to make decisions and demonstrates good insight into disease process. Alert and Oriented x 3. pleasant and cooperative. General Notes: Patient's wound bed showed signs of good granulation epithelization at this point. Fortunately I see no evidence of active infection locally nor systemically which is great news and overall I am extremely pleased with where we stand. I do believe that he is doing great changing this at home. Integumentary (Hair, Skin) Wound #1 status is Open. Original cause of wound was Laceration. The date acquired was: 05/03/2022. The wound has been in treatment 4 weeks. The wound is located on the Right,Medial Lower Leg. The wound measures 0.7cm length x 0.7cm width x 0.2cm depth; 0.385cm^2 area and 0.077cm^3 volume. There is Fat Layer (Subcutaneous Tissue) exposed. There is a medium amount of serosanguineous drainage noted. There is small (1-33%) red granulation within the wound bed. There is a small (1-33%) amount of necrotic tissue within the wound bed including Adherent Slough. Assessment Active Problems ICD-10 Chronic venous hypertension (idiopathic) with ulcer of right lower extremity Laceration without foreign body, right lower leg, initial encounter Non-pressure chronic ulcer of other part of right lower leg with fat layer exposed Paroxysmal  atrial fibrillation Long term (current) use of anticoagulants Essential (primary) hypertension Chronic obstructive pulmonary disease, unspecified Plan Bathing/ Shower/ Hygiene: Wash wounds with antibacterial soap and water. - Wash with Vashe. If burns too bad then use antibacterial soap Anesthetic (Use 'Patient Medications' Section for Anesthetic Order Entry): Lidocaine applied to wound bed WOUND #1: - Lower Leg Wound Laterality: Right, Medial Cleanser: Byram Ancillary Kit - 15 Day Supply (Generic) 3 x Per Week/30 Days Discharge Instructions: Use supplies as instructed; Kit contains: (15) Saline Bullets; (15) 3x3 Gauze; 15 pr Gloves Prim Dressing: Prisma 4.34 (in) (Dispense As Written) 3 x Per Week/30 Days ary Discharge Instructions: Moisten w/normal saline or sterile water; Cover wound as directed. Do not remove from wound bed. Secondary Dressing: ABD Pad 5x9 (in/in) (Generic) 3 x Per Week/30 Days Discharge Instructions: Cover with ABD pad Secured With: Kerlix Roll Sterile or Non-Sterile 6-ply 4.5x4 (yd/yd) (Generic) 3 x Per Week/30 Days Discharge Instructions: Apply Kerlix as directed 1. I am going to recommend that we have the patient go ahead and continue to monitor for any signs of infection or worsening. Obviously if anything changes he knows in contact the office and let me know. 2. I would recommend as well that he continue to use the Prisma which I think is doing a really good job. We will see patient back for reevaluation in 1 week here in the clinic. If anything worsens or changes patient will contact our office for additional recommendations. Billy Freeman, Billy Freeman ([id number]) [id number] Page 6 of 6 Electronic Signature(s) Signed: 07/14/2022 8:45:40 AM By: Billy Keeler PA-C Entered By: Billy Freeman on 07/14/2022 08:45:40 -------------------------------------------------------------------------------- SuperBill Details Patient Name: Date of  Service: Billy Freeman, Billy Freeman 07/14/2022 Medical Record Number: [id number] Patient Account Number: [id number] Date of Birth/Sex: Treating RN: 10/09/1950 (71 y.o. Billy Freeman Primary Care Provider: Tracie Freeman Other Clinician: Referring Provider: Treating Provider/Extender: Billy Freeman Weeks in Treatment: 4 Diagnosis Coding ICD-10 Codes Code Description I87.311 Chronic venous hypertension (idiopathic) with ulcer of right lower extremity S81.811A Laceration without foreign body, right lower leg, initial encounter L97.812 Non-pressure  chronic ulcer of other part of right lower leg with fat layer exposed I48.0 Paroxysmal atrial fibrillation Z79.01 Long term (current) use of anticoagulants I10 Essential (primary) hypertension J44.9 Chronic obstructive pulmonary disease, unspecified Facility Procedures : CPT4 Code: [id number] Description: 99213 - WOUND CARE VISIT-LEV 3 EST PT Modifier: Quantity: 1 Physician Procedures : CPT4 Code Description Modifier [id number] - WC PHYS LEVEL 3 - EST PT ICD-10 Diagnosis Description I87.311 Chronic venous hypertension (idiopathic) with ulcer of right lower extremity S81.811A Laceration without foreign body, right lower leg, initial  encounter X32.440 Non-pressure chronic ulcer of other part of right lower leg with fat layer exposed I48.0 Paroxysmal atrial fibrillation Quantity: 1 Electronic Signature(s) Signed: 07/14/2022 8:48:54 AM By: Billy Keeler PA-C Entered By: Billy Freeman on 07/14/2022 08:48:53

## 2022-07-15 NOTE — Progress Notes (Signed)
MYLEZ, VENABLE ([id number]) [id number] Page 1 of 10 Visit Report for 07/14/2022 Arrival Information Details Patient Name: Date of Service: Billy Freeman, Billy Freeman 07/14/2022 7:45 Billy M Medical Record Number: [id number] Patient Account Number: [id number] Date of Birth/Sex: Treating RN: Dec 10, 1950 (71 y.o. Billy Freeman Primary Care Remy Voiles: Tracie Harrier Other Clinician: Referring Alyce Inscore: Treating Latarshia Jersey/Extender: Solon Palm Weeks in Treatment: 4 Visit Information History Since Last Visit Added or deleted any medications: No Patient Arrived: Ambulatory Any new allergies or adverse reactions: No Arrival Time: 07:38 Had Billy fall or experienced change in No Accompanied By: self activities of daily living that may affect Transfer Assistance: None risk of falls: Patient Identification Verified: Yes Hospitalized since last visit: No Secondary Verification Process Completed: Yes Pain Present Now: No Patient Requires Transmission-Based Precautions: No Patient Has Alerts: Yes Patient Alerts: Patient on Blood Thinner NOT Diabetic Eliquis Electronic Signature(s) Signed: 07/14/2022 5:08:17 PM By: Rosalio Loud MSN RN CNS WTA Entered By: Rosalio Loud on 07/14/2022 07:50:42 -------------------------------------------------------------------------------- Clinic Level of Care Assessment Details Patient Name: Date of Service: Billy Freeman, Billy Freeman 07/14/2022 7:45 Billy M Medical Record Number: [id number] Patient Account Number: [id number] Date of Birth/Sex: Treating RN: 01-26-1951 (71 y.o. Billy Freeman Primary Care Amrutha Avera: Tracie Harrier Other Clinician: Referring Halden Phegley: Treating Danyetta Gillham/Extender: Solon Palm Weeks in Treatment: 4 Clinic Level of Care Assessment Items TOOL 4 Quantity Score X- 1 0 Use when only an EandM is performed on FOLLOW-UP visit ASSESSMENTS - Nursing Assessment / Reassessment X- 1 10 Reassessment of  Co-morbidities (includes updates in patient status) X- 1 5 Reassessment of Adherence to Treatment Plan ASSESSMENTS - Wound and Skin Billy ssessment / Reassessment X - Simple Wound Assessment / Reassessment - one wound 1 [place], Gwenlyn Perking ([id number]) [id number] Page 2 of 10 [] - 0 Complex Wound Assessment / Reassessment - multiple wounds [] - 0 Dermatologic / Skin Assessment (not related to wound area) ASSESSMENTS - Focused Assessment [] - 0 Circumferential Edema Measurements - multi extremities [] - 0 Nutritional Assessment / Counseling / Intervention [] - 0 Lower Extremity Assessment (monofilament, tuning fork, pulses) [] - 0 Peripheral Arterial Disease Assessment (using hand held doppler) ASSESSMENTS - Ostomy and/or Continence Assessment and Care [] - 0 Incontinence Assessment and Management [] - 0 Ostomy Care Assessment and Management (repouching, etc.) PROCESS - Coordination of Care X - Simple Patient / Family Education for ongoing care 1 15 [] - 0 Complex (extensive) Patient / Family Education for ongoing care X- 1 10 Staff obtains Programmer, systems, Records, T Results / Process Orders est [] - 0 Staff telephones HHA, Nursing Homes / Clarify orders / etc [] - 0 Routine Transfer to another Facility (non-emergent condition) [] - 0 Routine Hospital Admission (non-emergent condition) [] - 0 New Admissions / Biomedical engineer / Ordering NPWT Apligraf, etc. , [] - 0 Emergency Hospital Admission (emergent condition) X- 1 10 Simple Discharge Coordination [] - 0 Complex (extensive) Discharge Coordination PROCESS - Special Needs [] - 0 Pediatric / Minor Patient Management [] - 0 Isolation Patient Management [] - 0 Hearing / Language / Visual special needs [] - 0 Assessment of Community assistance (transportation, D/C planning, etc.) [] - 0 Additional assistance / Altered mentation [] - 0 Support Surface(s) Assessment (bed, cushion, seat,  etc.) INTERVENTIONS - Wound Cleansing / Measurement X - Simple Wound Cleansing - one wound 1 5 [] - 0 Complex Wound Cleansing - multiple wounds X- 1 5 Wound Imaging (photographs - any number of wounds) [] -  0 Wound Tracing (instead of photographs) X- 1 5 Simple Wound Measurement - one wound [] - 0 Complex Wound Measurement - multiple wounds INTERVENTIONS - Wound Dressings X - Small Wound Dressing one or multiple wounds 1 10 [] - 0 Medium Wound Dressing one or multiple wounds [] - 0 Large Wound Dressing one or multiple wounds [] - 0 Application of Medications - topical [] - 0 Application of Medications - injection INTERVENTIONS - Miscellaneous [] - 0 External ear exam [] - 0 Specimen Collection (cultures, biopsies, blood, body fluids, etc.) Billy Freeman, Billy Freeman ([id number]) [id number] Page 3 of 10 [] - 0 Specimen(s) / Culture(s) sent or taken to Lab for analysis [] - 0 Patient Transfer (multiple staff / Hoyer Lift / Similar devices) [] - 0 Simple Staple / Suture removal (25 or less) [] - 0 Complex Staple / Suture removal (26 or more) [] - 0 Hypo / Hyperglycemic Management (close monitor of Blood Glucose) [] - 0 Ankle / Brachial Index (ABI) - do not check if billed separately X- 1 5 Vital Signs Has the patient been seen at the hospital within the last three years: Yes Total Score: 85 Level Of Care: New/Established - Level 3 Electronic Signature(s) Signed: 07/14/2022 5:08:17 PM By: Smith, Vicki MSN RN CNS WTA Entered By: Smith, Vicki on 07/14/2022 08:04:11 -------------------------------------------------------------------------------- Encounter Discharge Information Details Patient Name: Date of Service: Billy Freeman, Billy Freeman 07/14/2022 7:45 Billy M Medical Record Number: [id number] Patient Account Number: [id number] Date of Birth/Sex: Treating RN: 02/28/1951 (71 y.o. M) Smith, Vicki Primary Care Provider: Hande, Vishwanath Other Clinician: Referring  Provider: Treating Provider/Extender: Stone, Hoyt Hande, Vishwanath Weeks in Treatment: 4 Encounter Discharge Information Items Discharge Condition: Stable Ambulatory Status: Ambulatory Discharge Destination: Home Transportation: Private Auto Accompanied By: self Schedule Follow-up Appointment: Yes Clinical Summary of Care: Electronic Signature(s) Signed: 07/14/2022 5:08:17 PM By: Smith, Vicki MSN RN CNS WTA Entered By: Smith, Vicki on 07/14/2022 08:05:19 -------------------------------------------------------------------------------- Lower Extremity Assessment Details Patient Name: Date of Service: Billy Freeman, Billy Freeman 07/14/2022 7:45 Billy M Medical Record Number: [id number] Patient Account Number: [id number] Date of Birth/Sex: Treating RN: 04/22/1951 (71 y.o. M) Smith, Vicki Lutes, Kaidon ([id number]) [id number] Page 4 of 10 Primary Care Provider: Hande, Vishwanath Other Clinician: Referring Provider: Treating Provider/Extender: Stone, Hoyt Hande, Vishwanath Weeks in Treatment: 4 Edema Assessment Assessed: [Left: No] [Right: No] [Left: Edema] [Right: :] Calf Left: Right: Point of Measurement: From Medial Instep 38.4 cm Ankle Left: Right: Point of Measurement: From Medial Instep 24.3 cm Vascular Assessment Pulses: Dorsalis Pedis Palpable: [Right:Yes] Electronic Signature(s) Signed: 07/14/2022 5:08:17 PM By: Smith, Vicki MSN RN CNS WTA Entered By: Smith, Vicki on 07/14/2022 07:59:45 -------------------------------------------------------------------------------- Multi Wound Chart Details Patient Name: Date of Service: Billy Freeman, Billy Freeman 07/14/2022 7:45 Billy M Medical Record Number: [id number] Patient Account Number: [id number] Date of Birth/Sex: Treating RN: 11/01/1950 (71 y.o. M) Smith, Vicki Primary Care Provider: Hande, Vishwanath Other Clinician: Referring Provider: Treating Provider/Extender: Stone, Hoyt Hande, Vishwanath Weeks in Treatment: 4 Vital  Signs Height(in): 76 Pulse(bpm): 61 Weight(lbs): 208 Blood Pressure(mmHg): 113/74 Body Mass Index(BMI): 25.3 Temperature(°F): 97.8 Respiratory Rate(breaths/min): 16 [1:Photos:] [N/Billy:N/Billy] Right, Medial Lower Leg N/Billy N/Billy Wound Location: Laceration N/Billy N/Billy Wounding Event: Venous Leg Ulcer N/Billy N/Billy Primary Etiology: Chronic Obstructive Pulmonary N/Billy N/Billy Comorbid History: Disease (COPD), Hypertension Billy Freeman, Billy Freeman ([id number]) [id number] Page 5 of 10 05/03/2022 N/Billy N/Billy Date Acquired: 4 N/Billy N/Billy Weeks of Treatment: Open N/Billy N/Billy Wound Status: No N/Billy N/Billy Wound Recurrence: 0.7x0.7x0.2 N/Billy N/Billy Measurements L x W x D (cm)   0.385 N/Billy N/Billy Billy (cm) : rea 0.077 N/Billy N/Billy Volume (cm) : 82.00% N/Billy N/Billy % Reduction in Area: 88.00% N/Billy N/Billy % Reduction in Volume: Full Thickness Without Exposed N/Billy N/Billy Classification: Support Structures Medium N/Billy N/Billy Exudate Amount: Serosanguineous N/Billy N/Billy Exudate Type: red, brown N/Billy N/Billy Exudate Color: Small (1-33%) N/Billy N/Billy Granulation Amount: Red N/Billy N/Billy Granulation Quality: Small (1-33%) N/Billy N/Billy Necrotic Amount: Fat Layer (Subcutaneous Tissue): Yes N/Billy N/Billy Exposed Structures: Fascia: No Tendon: No Muscle: No Joint: No Bone: No Small (1-33%) N/Billy N/Billy Epithelialization: Treatment Notes Electronic Signature(s) Signed: 07/14/2022 5:08:17 PM By: Smith, Vicki MSN RN CNS WTA Entered By: Smith, Vicki on 07/14/2022 08:01:58 -------------------------------------------------------------------------------- Multi-Disciplinary Care Plan Details Patient Name: Date of Service: Billy Freeman, Billy Freeman 07/14/2022 7:45 Billy M Medical Record Number: [id number] Patient Account Number: [id number] Date of Birth/Sex: Treating RN: 10/04/1950 (71 y.o. M) Smith, Vicki Primary Care Provider: Hande, Vishwanath Other Clinician: Referring Provider: Treating Provider/Extender: Stone, Hoyt Hande, Vishwanath Weeks in Treatment: 4 Active  Inactive Necrotic Tissue Nursing Diagnoses: Impaired tissue integrity related to necrotic/devitalized tissue Knowledge deficit related to management of necrotic/devitalized tissue Goals: Necrotic/devitalized tissue will be minimized in the wound bed Date Initiated: 06/13/2022 Target Resolution Date: 07/13/2022 Goal Status: Active Patient/caregiver will verbalize understanding of reason and process for debridement of necrotic tissue Date Initiated: 06/13/2022 Target Resolution Date: 07/13/2022 Goal Status: Active Interventions: Assess patient pain level pre-, during and post procedure and prior to discharge Provide education on necrotic tissue and debridement process Treatment Activities: Apply topical anesthetic as ordered : 06/13/2022 Wussow, Eleanor ([id number]) [id number] Page 6 of 10 Excisional debridement : 06/13/2022 Notes: Orientation to the Wound Care Program Nursing Diagnoses: Knowledge deficit related to the wound healing center program Goals: Patient/caregiver will verbalize understanding of the Wound Healing Center Program Date Initiated: 06/13/2022 Target Resolution Date: 06/27/2022 Goal Status: Active Interventions: Provide education on orientation to the wound center Notes: Venous Leg Ulcer Nursing Diagnoses: Potential for venous Insuffiency (use before diagnosis confirmed) Goals: Patient will maintain optimal edema control Date Initiated: 06/13/2022 Target Resolution Date: 07/13/2022 Goal Status: Active Patient/caregiver will verbalize understanding of disease process and disease management Date Initiated: 06/13/2022 Target Resolution Date: 07/13/2022 Goal Status: Active Verify adequate tissue perfusion prior to therapeutic compression application Date Initiated: 06/13/2022 Target Resolution Date: 06/13/2022 Goal Status: Active Interventions: Assess peripheral edema status every visit. Compression as ordered Provide education  on venous insufficiency Treatment Activities: Therapeutic compression applied : 06/13/2022 Notes: Wound/Skin Impairment Nursing Diagnoses: Impaired tissue integrity Knowledge deficit related to ulceration/compromised skin integrity Goals: Patient will demonstrate Billy reduced rate of smoking or cessation of smoking Date Initiated: 06/13/2022 Target Resolution Date: 07/13/2022 Goal Status: Active Patient will have Billy decrease in wound volume by X% from date: (specify in notes) Date Initiated: 06/13/2022 Target Resolution Date: 07/13/2022 Goal Status: Active Patient/caregiver will verbalize understanding of skin care regimen Date Initiated: 06/13/2022 Target Resolution Date: 07/13/2022 Goal Status: Active Ulcer/skin breakdown will have Billy volume reduction of 30% by week 4 Date Initiated: 06/13/2022 Target Resolution Date: 07/13/2022 Goal Status: Active Ulcer/skin breakdown will have Billy volume reduction of 50% by week 8 Date Initiated: 06/13/2022 Target Resolution Date: 08/13/2022 Goal Status: Active Ulcer/skin breakdown will have Billy volume reduction of 80% by week 12 Date Initiated: 06/13/2022 Target Resolution Date: 09/13/2022 Goal Status: Active Ulcer/skin breakdown will heal within 14 weeks Date Initiated: 06/13/2022 Target Resolution Date: 09/27/2022 Goal Status: Active Interventions: Assess patient/caregiver ability to obtain necessary supplies Assess patient/caregiver ability to perform ulcer/skin care regimen upon   admission and as needed Billy Freeman, Billy Freeman ([id number]) [id number] Page 7 of 10 Assess ulceration(s) every visit Provide education on ulcer and skin care Notes: Electronic Signature(s) Signed: 07/14/2022 5:08:17 PM By: Smith, Vicki MSN RN CNS WTA Entered By: Smith, Vicki on 07/14/2022 08:04:38 -------------------------------------------------------------------------------- Pain Assessment Details Patient Name: Date of Service: Billy Freeman, Billy Freeman  07/14/2022 7:45 Billy M Medical Record Number: [id number] Patient Account Number: [id number] Date of Birth/Sex: Treating RN: 04/11/1951 (71 y.o. M) Smith, Vicki Primary Care Provider: Hande, Vishwanath Other Clinician: Referring Provider: Treating Provider/Extender: Stone, Hoyt Hande, Vishwanath Weeks in Treatment: 4 Active Problems Location of Pain Severity and Description of Pain Patient Has Paino No Site Locations Pain Management and Medication Current Pain Management: Electronic Signature(s) Signed: 07/14/2022 5:08:17 PM By: Smith, Vicki MSN RN CNS WTA Entered By: Smith, Vicki on 07/14/2022 07:54:11 Kokesh, Decklan ([id number]) [id number] Page 8 of 10 -------------------------------------------------------------------------------- Patient/Caregiver Education Details Patient Name: Date of Service: Billy Freeman, Billy Freeman 12/18/2023andnbsp7:45 Billy M Medical Record Number: [id number] Patient Account Number: [id number] Date of Birth/Gender: Treating RN: 06/17/1951 (71 y.o. M) Smith, Vicki Primary Care Physician: Hande, Vishwanath Other Clinician: Referring Physician: Treating Physician/Extender: Stone, Hoyt Hande, Vishwanath Weeks in Treatment: 4 Education Assessment Education Provided To: Patient Education Topics Provided Wound/Skin Impairment: Handouts: Caring for Your Ulcer Methods: Explain/Verbal Responses: State content correctly Electronic Signature(s) Signed: 07/14/2022 5:08:17 PM By: Smith, Vicki MSN RN CNS WTA Entered By: Smith, Vicki on 07/14/2022 08:04:28 -------------------------------------------------------------------------------- Wound Assessment Details Patient Name: Date of Service: Billy Freeman, Billy Freeman 07/14/2022 7:45 Billy M Medical Record Number: [id number] Patient Account Number: [id number] Date of Birth/Sex: Treating RN: 05/28/1951 (71 y.o. M) Smith, Vicki Primary Care Provider: Hande, Vishwanath Other Clinician: Referring Provider: Treating  Provider/Extender: Stone, Hoyt Hande, Vishwanath Weeks in Treatment: 4 Wound Status Wound Number: 1 Primary Etiology: Venous Leg Ulcer Wound Location: Right, Medial Lower Leg Wound Status: Open Wounding Event: Laceration Comorbid Chronic Obstructive Pulmonary Disease (COPD), History: Hypertension Date Acquired: 05/03/2022 Weeks Of Treatment: 4 Clustered Wound: No Photos Yousuf, Denzell ([id number]) [id number] Page 9 of 10 Wound Measurements Length: (cm) 0.7 Width: (cm) 0.7 Depth: (cm) 0.2 Area: (cm) 0.385 Volume: (cm) 0.077 % Reduction in Area: 82% % Reduction in Volume: 88% Epithelialization: Small (1-33%) Wound Description Classification: Full Thickness Without Exposed Support Structures Exudate Amount: Medium Exudate Type: Serosanguineous Exudate Color: red, brown Foul Odor After Cleansing: No Slough/Fibrino Yes Wound Bed Granulation Amount: Small (1-33%) Exposed Structure Granulation Quality: Red Fascia Exposed: No Necrotic Amount: Small (1-33%) Fat Layer (Subcutaneous Tissue) Exposed: Yes Necrotic Quality: Adherent Slough Tendon Exposed: No Muscle Exposed: No Joint Exposed: No Bone Exposed: No Treatment Notes Wound #1 (Lower Leg) Wound Laterality: Right, Medial Cleanser Byram Ancillary Kit - 15 Day Supply Discharge Instruction: Use supplies as instructed; Kit contains: (15) Saline Bullets; (15) 3x3 Gauze; 15 pr Gloves Peri-Wound Care Topical Primary Dressing Prisma 4.34 (in) Discharge Instruction: Moisten w/normal saline or sterile water; Cover wound as directed. Do not remove from wound bed. Secondary Dressing ABD Pad 5x9 (in/in) Discharge Instruction: Cover with ABD pad Secured With Kerlix Roll Sterile or Non-Sterile 6-ply 4.5x4 (yd/yd) Discharge Instruction: Apply Kerlix as directed Compression Wrap Compression Stockings Add-Ons Electronic Signature(s) Signed: 07/14/2022 5:08:17 PM By: Smith, Vicki MSN RN CNS WTA Entered  By: Smith, Vicki on 07/14/2022 07:58:46 Riedinger, Jshon ([id number]) [id number] Page 10 of 10 -------------------------------------------------------------------------------- Vitals Details Patient Name: Date of Service: Billy Freeman, Billy Freeman 07/14/2022 7:45 Billy M Medical Record Number: [id number] Patient Account Number: [id number] Date of Birth/Sex: Treating RN: 09/27/1950 (71 y.o. M)   Rosalio Loud Primary Care Verlin Uher: Tracie Harrier Other Clinician: Referring Makiyah Zentz: Treating Kadeidra Coryell/Extender: Solon Palm Weeks in Treatment: 4 Vital Signs Time Taken: 07:50 Temperature (F): 97.8 Height (in): 76 Pulse (bpm): 61 Weight (lbs): 208 Respiratory Rate (breaths/min): 16 Body Mass Index (BMI): 25.3 Blood Pressure (mmHg): 113/74 Reference Range: 80 - 120 mg / dl Electronic Signature(s) Signed: 07/14/2022 5:08:17 PM By: Rosalio Loud MSN RN CNS WTA Entered By: Rosalio Loud on 07/14/2022 07:54:04

## 2022-07-18 ENCOUNTER — Ambulatory Visit: Payer: Medicare Other

## 2022-07-18 ENCOUNTER — Ambulatory Visit (LOCAL_COMMUNITY_HEALTH_CENTER): Payer: Medicare Other

## 2022-07-18 DIAGNOSIS — Z23 Encounter for immunization: Secondary | ICD-10-CM | POA: Diagnosis not present

## 2022-07-18 DIAGNOSIS — Z719 Counseling, unspecified: Secondary | ICD-10-CM

## 2022-07-18 NOTE — Progress Notes (Signed)
Client seen in Nurse Clinic for Hep A and Hep B.    VIS provided.  After vaccine care reviewed. Copy of [facility] given.  Chalese Peach Shelda Pal, RN

## 2022-07-29 ENCOUNTER — Encounter: Payer: Medicare Other | Attending: Physician Assistant | Admitting: Physician Assistant

## 2022-07-29 DIAGNOSIS — W228XXA Striking against or struck by other objects, initial encounter: Secondary | ICD-10-CM | POA: Diagnosis not present

## 2022-07-29 DIAGNOSIS — Z7901 Long term (current) use of anticoagulants: Secondary | ICD-10-CM | POA: Insufficient documentation

## 2022-07-29 DIAGNOSIS — J449 Chronic obstructive pulmonary disease, unspecified: Secondary | ICD-10-CM | POA: Diagnosis not present

## 2022-07-29 DIAGNOSIS — L97812 Non-pressure chronic ulcer of other part of right lower leg with fat layer exposed: Secondary | ICD-10-CM | POA: Diagnosis not present

## 2022-07-29 DIAGNOSIS — I10 Essential (primary) hypertension: Secondary | ICD-10-CM | POA: Diagnosis not present

## 2022-07-29 DIAGNOSIS — I87311 Chronic venous hypertension (idiopathic) with ulcer of right lower extremity: Secondary | ICD-10-CM | POA: Insufficient documentation

## 2022-07-29 DIAGNOSIS — S81811A Laceration without foreign body, right lower leg, initial encounter: Secondary | ICD-10-CM | POA: Diagnosis not present

## 2022-07-29 DIAGNOSIS — I480 Paroxysmal atrial fibrillation: Secondary | ICD-10-CM | POA: Diagnosis not present

## 2022-07-29 NOTE — Progress Notes (Signed)
ADHAM, JOHNSON ([id number]) [id number] Page 1 of 6 Visit Report for 07/29/2022 Chief Complaint Document Details Patient Name: Date of Service: Billy Freeman, Billy Freeman 07/29/2022 8:15 A M Medical Record Number: [id number] Patient Account Number: [id number] Date of Birth/Sex: Treating RN: January 11, 1951 (72 y.o. Seward Meth Primary Care Provider: Tracie Harrier Other Clinician: Massie Kluver Referring Provider: Treating Provider/Extender: Solon Palm Weeks in Treatment: 6 Information Obtained from: Patient Chief Complaint Right LE Ulcer Electronic Signature(s) Signed: 07/29/2022 8:28:02 AM By: Worthy Keeler PA-C Entered By: Worthy Keeler on 07/29/2022 08:28:01 -------------------------------------------------------------------------------- HPI Details Patient Name: Date of Service: Billy Freeman, Billy Freeman 07/29/2022 8:15 A M Medical Record Number: [id number] Patient Account Number: [id number] Date of Birth/Sex: Treating RN: September 14, 1950 (72 y.o. Seward Meth Primary Care Provider: Tracie Harrier Other Clinician: Massie Kluver Referring Provider: Treating Provider/Extender: Solon Palm Weeks in Treatment: 6 History of Present Illness HPI Description: 06-13-2022 upon evaluation today patient appears to be doing poorly currently in regard to the wound on his leg. He subsequently tells me that he hit this on a piece of wood when he was putting something together this was around December 7. Subsequently he states that since that time he has been having issues with getting it to heal at 1 point he believes it was likely infected it was hurting quite severely. He ended up actually switching to bacitracin ointment at that point he tells me a lot of the severe pain improved following. With that being said he tells me that with what we are seeing here currently that he was still having a lot of issues with getting this to close and therefore  wanted to come into the wound care center as he has had a good experience with his mother before going to the wound care center in [place] where they were able to get the wound healed that otherwise had been recommended may require amputation. He has been in the hospital for his heart and tells me that during that time he had been previously that using Neosporin and a Band-Aid. In the hospital he was told to use Medihoney and a silver alginate to cover. With that being said he tells me that it really has not helped tremendously he eventually went to using the bacitracin which has been more recent. Patient does have a history of what appears to be venous insufficiency of the lower extremities he tells me has been trying to elevate his legs much as possible. He also has a history of atrial fibrillation, long-term use of anticoagulant therapy due to this he is on Eliquis, he does have hypertension, and COPD. He is also more recently been in the hospital for his heart he has a heart monitor on even now. 06-30-2022 upon evaluation today patient appears to be doing well currently in regard to his wound he seems to be making good progress which is great news. I see no signs of infection. Billy Freeman, Billy Freeman ([id number]) [id number] Page 2 of 6 07-07-2022 upon evaluation today patient appears to be doing excellent in regard to his wound. He has been tolerating the dressing changes without complication. Fortunately there does not appear to be any signs of active infection locally or systemically at this time which is great news. No fevers, chills, nausea, vomiting, or diarrhea. 07-14-2022 upon evaluation today patient appears to be doing excellent in regard to his wound. He has been tolerating the dressing changes without complication. This actually seems to be doing very well at this point.  07-29-2022 upon evaluation today patient appears to be doing well currently in regard to his wound  which is actually significantly smaller. I am very pleased with where we stand I do believe that he is making great progress currently. Fortunately I do not see any signs of active infection locally nor systemically at this time. No fevers, chills, nausea, vomiting, or diarrhea. Electronic Signature(s) Signed: 07/29/2022 5:07:09 PM By: Worthy Keeler PA-C Entered By: Worthy Keeler on 07/29/2022 17:07:09 -------------------------------------------------------------------------------- Physical Exam Details Patient Name: Date of Service: Billy Freeman, Billy Freeman 07/29/2022 8:15 A M Medical Record Number: [id number] Patient Account Number: [id number] Date of Birth/Sex: Treating RN: 17-Apr-1951 (72 y.o. Seward Meth Primary Care Provider: Tracie Harrier Other Clinician: Massie Kluver Referring Provider: Treating Provider/Extender: Solon Palm Weeks in Treatment: 6 Constitutional Well-nourished and well-hydrated in no acute distress. Respiratory normal breathing without difficulty. Psychiatric this patient is able to make decisions and demonstrates good insight into disease process. Alert and Oriented x 3. pleasant and cooperative. Notes Upon inspection patient's wound bed actually showed signs of excellent granulation epithelization there was no need for sharp debridement and overall the patient actually seems to be making really good progress here. I am very pleased with where we stand. Electronic Signature(s) Signed: 07/29/2022 5:07:22 PM By: Worthy Keeler PA-C Entered By: Worthy Keeler on 07/29/2022 17:07:21 -------------------------------------------------------------------------------- Physician Orders Details Patient Name: Date of Service: Billy Freeman, Billy Freeman 07/29/2022 8:15 A M Medical Record Number: [id number] Patient Account Number: [id number] Date of Birth/Sex: Treating RN: 08-04-1950 (72 y.o. Seward Meth Primary Care Provider: Tracie Harrier Other Clinician:  Massie Kluver Referring Provider: Treating Provider/Extender: Solon Palm [place], Billy Freeman ([id number]) [id number] Page 3 of 6 Weeks in Treatment: 6 Verbal / Phone Orders: No Diagnosis Coding ICD-10 Coding Code Description I87.311 Chronic venous hypertension (idiopathic) with ulcer of right lower extremity S81.811A Laceration without foreign body, right lower leg, initial encounter L97.812 Non-pressure chronic ulcer of other part of right lower leg with fat layer exposed I48.0 Paroxysmal atrial fibrillation Z79.01 Long term (current) use of anticoagulants I10 Essential (primary) hypertension J44.9 Chronic obstructive pulmonary disease, unspecified Bathing/ Shower/ Hygiene Wash wounds with antibacterial soap and water. - Wash with Dial soap. May shower; gently cleanse wound with antibacterial soap, rinse and pat dry prior to dressing wounds No tub bath. Anesthetic (Use 'Patient Medications' Section for Anesthetic Order Entry) Lidocaine applied to wound bed Wound Treatment Wound #1 - Lower Leg Wound Laterality: Right, Medial Cleanser: Byram Ancillary Kit - 15 Day Supply (Generic) 3 x Per Week/30 Days Discharge Instructions: Use supplies as instructed; Kit contains: (15) Saline Bullets; (15) 3x3 Gauze; 15 pr Gloves Prim Dressing: Prisma 4.34 (in) (Dispense As Written) 3 x Per Week/30 Days ary Discharge Instructions: Moisten w/normal saline or sterile water; Cover wound as directed. Do not remove from wound bed. Secondary Dressing: ABD Pad 5x9 (in/in) (Generic) 3 x Per Week/30 Days Discharge Instructions: Cover with ABD pad Secured With: Kerlix Roll Sterile or Non-Sterile 6-ply 4.5x4 (yd/yd) (Generic) 3 x Per Week/30 Days Discharge Instructions: Apply Kerlix as directed Electronic Signature(s) Signed: 07/29/2022 5:11:32 PM By: Worthy Keeler PA-C Signed: 07/30/2022 2:03:30 PM By: Massie Kluver Entered By: Massie Kluver on 07/29/2022  08:43:28 -------------------------------------------------------------------------------- Problem List Details Patient Name: Date of Service: Billy Freeman, Billy Freeman 07/29/2022 8:15 A M Medical Record Number: [id number] Patient Account Number: [id number] Date of Birth/Sex: Treating RN: 07-Apr-1951 (72 y.o. Seward Meth Primary Care Provider: Tracie Harrier Other Clinician: Massie Kluver Referring  Provider: Treating Provider/Extender: Solon Palm Weeks in Treatment: 6 Active Problems ICD-10 Encounter Code Description Active Date MDM Diagnosis I87.311 Chronic venous hypertension (idiopathic) with ulcer of right lower extremity 06/13/2022 No Yes Billy Freeman, Billy Freeman ([id number]) [id number] Page 4 of 6 [phone] Laceration without foreign body, right lower leg, initial encounter 06/13/2022 No Yes L97.812 Non-pressure chronic ulcer of other part of right lower leg with fat layer 06/13/2022 No Yes exposed I48.0 Paroxysmal atrial fibrillation 06/13/2022 No Yes Z79.01 Long term (current) use of anticoagulants 06/13/2022 No Yes I10 Essential (primary) hypertension 06/13/2022 No Yes J44.9 Chronic obstructive pulmonary disease, unspecified 06/13/2022 No Yes Inactive Problems Resolved Problems Electronic Signature(s) Signed: 07/29/2022 8:27:57 AM By: Worthy Keeler PA-C Entered By: Worthy Keeler on 07/29/2022 08:27:57 -------------------------------------------------------------------------------- Progress Note Details Patient Name: Date of Service: Billy Freeman, Billy Freeman 07/29/2022 8:15 A M Medical Record Number: [id number] Patient Account Number: [id number] Date of Birth/Sex: Treating RN: 06/05/1951 (72 y.o. Seward Meth Primary Care Provider: Tracie Harrier Other Clinician: Massie Kluver Referring Provider: Treating Provider/Extender: Solon Palm Weeks in Treatment: 6 Subjective Chief Complaint Information obtained from  Patient Right LE Ulcer History of Present Illness (HPI) 06-13-2022 upon evaluation today patient appears to be doing poorly currently in regard to the wound on his leg. He subsequently tells me that he hit this on a piece of wood when he was putting something together this was around December 7. Subsequently he states that since that time he has been having issues with getting it to heal at 1 point he believes it was likely infected it was hurting quite severely. He ended up actually switching to bacitracin ointment at that point he tells me a lot of the severe pain improved following. With that being said he tells me that with what we are seeing here currently that he was still having a lot of issues with getting this to close and therefore wanted to come into the wound care center as he has had a good experience with his mother before going to the wound care center in [place] where they were able to get the wound healed that otherwise had been recommended may require amputation. He has been in the hospital for his heart and tells me that during that time he had been previously that using Neosporin and a Band-Aid. In the hospital he was told to use Medihoney and a silver alginate to cover. With that being said he tells me that it really has not helped tremendously he eventually went to using the bacitracin which has been more recent. Patient does have a history of what appears to be venous insufficiency of the lower extremities he tells me has been Billy Freeman, Billy Freeman ([id number]) [id number] Page 5 of 6 trying to elevate his legs much as possible. He also has a history of atrial fibrillation, long-term use of anticoagulant therapy due to this he is on Eliquis, he does have hypertension, and COPD. He is also more recently been in the hospital for his heart he has a heart monitor on even now. 06-30-2022 upon evaluation today patient appears to be doing well currently in regard to  his wound he seems to be making good progress which is great news. I see no signs of infection. 07-07-2022 upon evaluation today patient appears to be doing excellent in regard to his wound. He has been tolerating the dressing changes without complication. Fortunately there does not appear to be any signs of active infection locally or systemically at this time which  is great news. No fevers, chills, nausea, vomiting, or diarrhea. 07-14-2022 upon evaluation today patient appears to be doing excellent in regard to his wound. He has been tolerating the dressing changes without complication. This actually seems to be doing very well at this point. 07-29-2022 upon evaluation today patient appears to be doing well currently in regard to his wound which is actually significantly smaller. I am very pleased with where we stand I do believe that he is making great progress currently. Fortunately I do not see any signs of active infection locally nor systemically at this time. No fevers, chills, nausea, vomiting, or diarrhea. Objective Constitutional Well-nourished and well-hydrated in no acute distress. Vitals Time Taken: 8:27 AM, Height: 76 in, Weight: 208 lbs, BMI: 25.3, Pulse: 63 bpm, Respiratory Rate: 18 breaths/min, Blood Pressure: 116/76 mmHg. Respiratory normal breathing without difficulty. Psychiatric this patient is able to make decisions and demonstrates good insight into disease process. Alert and Oriented x 3. pleasant and cooperative. General Notes: Upon inspection patient's wound bed actually showed signs of excellent granulation epithelization there was no need for sharp debridement and overall the patient actually seems to be making really good progress here. I am very pleased with where we stand. Integumentary (Hair, Skin) Wound #1 status is Open. Original cause of wound was Laceration. The date acquired was: 05/03/2022. The wound has been in treatment 6 weeks. The wound is located on the  Right,Medial Lower Leg. The wound measures 0.3cm length x 0.3cm width x 0.1cm depth; 0.071cm^2 area and 0.007cm^3 volume. There is Fat Layer (Subcutaneous Tissue) exposed. There is no tunneling or undermining noted. There is a medium amount of serosanguineous drainage noted. There is small (1-33%) red granulation within the wound bed. There is a small (1-33%) amount of necrotic tissue within the wound bed including Adherent Slough. Assessment Active Problems ICD-10 Chronic venous hypertension (idiopathic) with ulcer of right lower extremity Laceration without foreign body, right lower leg, initial encounter Non-pressure chronic ulcer of other part of right lower leg with fat layer exposed Paroxysmal atrial fibrillation Long term (current) use of anticoagulants Essential (primary) hypertension Chronic obstructive pulmonary disease, unspecified Plan Bathing/ Shower/ Hygiene: Wash wounds with antibacterial soap and water. - Wash with Dial soap. May shower; gently cleanse wound with antibacterial soap, rinse and pat dry prior to dressing wounds No tub bath. Anesthetic (Use 'Patient Medications' Section for Anesthetic Order Entry): Lidocaine applied to wound bed WOUND #1: - Lower Leg Wound Laterality: Right, Medial Cleanser: Byram Ancillary Kit - 15 Day Supply (Generic) 3 x Per Week/30 Days Discharge Instructions: Use supplies as instructed; Kit contains: (15) Saline Bullets; (15) 3x3 Gauze; 15 pr Gloves Prim Dressing: Prisma 4.34 (in) (Dispense As Written) 3 x Per Week/30 Days ary Discharge Instructions: Moisten w/normal saline or sterile water; Cover wound as directed. Do not remove from wound bed. Secondary Dressing: ABD Pad 5x9 (in/in) (Generic) 3 x Per Week/30 Days Discharge Instructions: Cover with ABD pad Secured With: Kerlix Roll Sterile or Non-Sterile 6-ply 4.5x4 (yd/yd) (Generic) 3 x Per Week/30 Days Discharge Instructions: Apply Mancel Parsons as directed Billy Freeman, Billy Freeman ([id number])  [id number] Page 6 of 6 1. I am good recommend that he continue for the time being with his wound care measures as before this includes the use of the silver collagen followed by the ABD pad and roll gauze to secure in place. 2. I am also can recommend that he continue with the Tubigrip which seems to be doing excellent for him. 3. I would also suggest  that he continue to monitor for any signs of infection or worsening. Obviously if anything changes he should contact the office and let me know. We will see patient back for reevaluation in 1 week here in the clinic. If anything worsens or changes patient will contact our office for additional recommendations. Electronic Signature(s) Signed: 07/29/2022 5:07:54 PM By: Worthy Keeler PA-C Entered By: Worthy Keeler on 07/29/2022 17:07:54 -------------------------------------------------------------------------------- SuperBill Details Patient Name: Date of Service: Billy Freeman, Billy Freeman 07/29/2022 Medical Record Number: [id number] Patient Account Number: [id number] Date of Birth/Sex: Treating RN: [birth year] (72 y.o. Seward Meth Primary Care Provider: Tracie Harrier Other Clinician: Massie Kluver Referring Provider: Treating Provider/Extender: Solon Palm Weeks in Treatment: 6 Diagnosis Coding ICD-10 Codes Code Description [phone] Chronic venous hypertension (idiopathic) with ulcer of right lower extremity S81.811A Laceration without foreign body, right lower leg, initial encounter L97.812 Non-pressure chronic ulcer of other part of right lower leg with fat layer exposed I48.0 Paroxysmal atrial fibrillation Z79.01 Long term (current) use of anticoagulants I10 Essential (primary) hypertension J44.9 Chronic obstructive pulmonary disease, unspecified Facility Procedures : CPT4 Code: [id number] Description: 99213 - WOUND CARE VISIT-LEV 3 EST PT Modifier: Quantity: 1 Physician Procedures : CPT4  Code Description Modifier [id number] - WC PHYS LEVEL 3 - EST PT ICD-10 Diagnosis Description I87.311 Chronic venous hypertension (idiopathic) with ulcer of right lower extremity S81.811A Laceration without foreign body, right lower leg, initial  encounter L97.812 Non-pressure chronic ulcer of other part of right lower leg with fat layer exposed I48.0 Paroxysmal atrial fibrillation Quantity: 1 Electronic Signature(s) Signed: 07/29/2022 5:08:17 PM By: Worthy Keeler PA-C Entered By: Worthy Keeler on 07/29/2022 17:08:16

## 2022-07-30 NOTE — Progress Notes (Signed)
ALPHEUS, STIFF ([id number]) [id number] Page 1 of 10 Visit Report for 07/29/2022 Arrival Information Details Patient Name: Date of Service: Billy Freeman, Billy Freeman 07/29/2022 8:15 A M Medical Record Number: [id number] Patient Account Number: [id number] Date of Birth/Sex: Treating RN: [birth year] (72 y.o. Seward Meth Primary Care Sammie Denner: Tracie Harrier Other Clinician: Massie Kluver Referring Annaleah Arata: Treating Francys Bolin/Extender: Solon Palm Weeks in Treatment: 6 Visit Information History Since Last Visit All ordered tests and consults were completed: No Patient Arrived: Ambulatory Added or deleted any medications: No Arrival Time: 08:25 Any new allergies or adverse reactions: No Transfer Assistance: None Had a fall or experienced change in No Patient Identification Verified: Yes activities of daily living that may affect Secondary Verification Process Completed: Yes risk of falls: Patient Requires Transmission-Based Precautions: No Signs or symptoms of abuse/neglect since last visito No Patient Has Alerts: Yes Hospitalized since last visit: No Patient Alerts: Patient on Blood Thinner Implantable device outside of the clinic excluding No NOT Diabetic cellular tissue based products placed in the center Eliquis since last visit: Has Dressing in Place as Prescribed: Yes Pain Present Now: No Electronic Signature(s) Signed: 07/30/2022 2:03:30 PM By: Massie Kluver Entered By: Massie Kluver on 07/29/2022 08:27:01 -------------------------------------------------------------------------------- Clinic Level of Care Assessment Details Patient Name: Date of Service: Billy Freeman, Billy Freeman 07/29/2022 8:15 A M Medical Record Number: [id number] Patient Account Number: [id number] Date of Birth/Sex: Treating RN: [birth year] (72 y.o. Seward Meth Primary Care York Valliant: Tracie Harrier Other Clinician: Massie Kluver Referring Tremain Rucinski: Treating  Altonio Schwertner/Extender: Solon Palm Weeks in Treatment: 6 Clinic Level of Care Assessment Items TOOL 4 Quantity Score _0  - 0 Use when only an EandM is performed on FOLLOW-UP visit ASSESSMENTS - Nursing Assessment / Reassessment X- 1 10 Reassessment of Co-morbidities (includes updates in patient status) X- 1 5 Reassessment of Adherence to Treatment Plan VEDANSH, KERSTETTER ([id number]) [id number] Page 2 of 10 ASSESSMENTS - Wound and Skin A ssessment / Reassessment X - Simple Wound Assessment / Reassessment - one wound 1 5 _1  - 0 Complex Wound Assessment / Reassessment - multiple wounds _2  - 0 Dermatologic / Skin Assessment (not related to wound area) ASSESSMENTS - Focused Assessment _3  - 0 Circumferential Edema Measurements - multi extremities _4  - 0 Nutritional Assessment / Counseling / Intervention _5  - 0 Lower Extremity Assessment (monofilament, tuning fork, pulses) _6  - 0 Peripheral Arterial Disease Assessment (using hand held doppler) ASSESSMENTS - Ostomy and/or Continence Assessment and Care _7  - 0 Incontinence Assessment and Management _8  - 0 Ostomy Care Assessment and Management (repouching, etc.) PROCESS - Coordination of Care X - Simple Patient / Family Education for ongoing care 1 15 _9  - 0 Complex (extensive) Patient / Family Education for ongoing care _10  - 0 Staff obtains Programmer, systems, Records, T Results / Process Orders est _11  - 0 Staff telephones HHA, Nursing Homes / Clarify orders / etc _12  - 0 Routine Transfer to another Facility (non-emergent condition) _13  - 0 Routine Hospital Admission (non-emergent condition) _14  - 0 New Admissions / Biomedical engineer / Ordering NPWT Apligraf, etc. , _15  - 0 Emergency Hospital Admission (emergent condition) X- 1 10 Simple Discharge Coordination _16  - 0 Complex (extensive) Discharge Coordination PROCESS - Special Needs _17  - 0 Pediatric / Minor Patient Management _18  -  0 Isolation Patient Management _19  - 0 Hearing / Language / Visual special needs _20  - 0 Assessment of Community assistance (transportation, D/C planning, etc.) _21  - 0 Additional assistance / Altered mentation _22  - 0 Support Surface(s) Assessment (bed,  cushion, seat, etc.) INTERVENTIONS - Wound Cleansing / Measurement X - Simple Wound Cleansing - one wound 1 5 _0  - 0 Complex Wound Cleansing - multiple wounds X- 1 5 Wound Imaging (photographs - any number of wounds) _1  - 0 Wound Tracing (instead of photographs) X- 1 5 Simple Wound Measurement - one wound _2  - 0 Complex Wound Measurement - multiple wounds INTERVENTIONS - Wound Dressings _3  - 0 Small Wound Dressing one or multiple wounds X- 1 15 Medium Wound Dressing one or multiple wounds _4  - 0 Large Wound Dressing one or multiple wounds X- 1 5 Application of Medications - topical <[id number]>_8<\/IONGEXBMWUXLKGMW>_1  - 0 Application of Medications - injection INTERVENTIONS - Miscellaneous _6  - 0 External ear exam Billy Freeman, Billy Freeman ([id number]) [id number] Page 3 of 10 _7  - 0 Specimen Collection (cultures, biopsies, blood, body fluids, etc.) _8  - 0 Specimen(s) / Culture(s) sent or taken to Lab for analysis _9  - 0 Patient Transfer (multiple staff / Harrel Lemon Lift / Similar devices) _10  - 0 Simple Staple / Suture removal (25 or less) _11  - 0 Complex Staple / Suture removal (26 or more) _12  - 0 Hypo / Hyperglycemic Management (close monitor of Blood Glucose) _13  - 0 Ankle / Brachial Index (ABI) - do not check if billed separately X- 1 5 Vital Signs Has the patient been seen at the hospital within the last three years: Yes Total Score: 85 Level Of Care: New/Established - Level 3 Electronic Signature(s) Signed: 07/30/2022 2:03:30 PM By: Massie Kluver Entered By: Massie Kluver on 07/29/2022 08:43:53 -------------------------------------------------------------------------------- Encounter Discharge Information Details Patient Name: Date  of Service: Billy Freeman, Billy Freeman 07/29/2022 8:15 A M Medical Record Number: [id number] Patient Account Number: [id number] Date of Birth/Sex: Treating RN: [birth year] (72 y.o. Seward Meth Primary Care Aspin Palomarez: Tracie Harrier Other Clinician: Massie Kluver Referring Donovon Micheletti: Treating Janitza Revuelta/Extender: Solon Palm Weeks in Treatment: 6 Encounter Discharge Information Items Discharge Condition: Stable Ambulatory Status: Ambulatory Discharge Destination: Home Transportation: Private Auto Accompanied By: self Schedule Follow-up Appointment: Yes Clinical Summary of Care: Electronic Signature(s) Signed: 07/30/2022 2:03:30 PM By: Massie Kluver Entered By: Massie Kluver on 07/29/2022 08:52:16 Lower Extremity Assessment Details -------------------------------------------------------------------------------- Rosita Fire ([id number]) [id number] Page 4 of 10 Patient Name: Date of Service: Billy Freeman, Billy Freeman 07/29/2022 8:15 A M Medical Record Number: [id number] Patient Account Number: [id number] Date of Birth/Sex: Treating RN: September 12, 1950 (72 y.o. Seward Meth Primary Care Oralia Criger: Tracie Harrier Other Clinician: Massie Kluver Referring Caterine Mcmeans: Treating Maris Abascal/Extender: Solon Palm Weeks in Treatment: 6 Edema Assessment Left: Right: Assessed: No Yes Edema: Yes Calf Left: Right: Point of Measurement: 34 cm From Medial Instep 36.4 cm Ankle Left: Right: Point of Measurement: 12 cm From Medial Instep 24 cm Vascular Assessment Left: Right: Pulses: Dorsalis Pedis Palpable: Yes Electronic Signature(s) Signed: 07/29/2022 3:52:31 PM By: Rosalio Loud MSN RN CNS WTA Signed: 07/30/2022 2:03:30 PM By: Massie Kluver Entered By: Massie Kluver on 07/29/2022 08:36:33 -------------------------------------------------------------------------------- Multi Wound Chart Details Patient Name: Date of Service: Billy Freeman, Billy Freeman 07/29/2022  8:15 A M Medical Record Number: [id number] Patient Account Number: [id number] Date of Birth/Sex: Treating RN: 13-Sep-1950 (72 y.o. Seward Meth Primary Care Shantale Holtmeyer: Tracie Harrier Other Clinician: Massie Kluver Referring Towanna Avery: Treating Aaron Bostwick/Extender: Solon Palm Weeks in Treatment: 6 Vital Signs Height(in): 76 Pulse(bpm): 63 Weight(lbs): 208 Blood Pressure(mmHg): 116/76 Body Mass Index(BMI): 25.3 Temperature(F): Respiratory Rate(breaths/min): 18 [1:Photos:] [N/A:N/A] Right, Medial Lower Leg N/A N/A Wound Location: [facility], [facility] ([id number]) [id number] Page 5 of 10 Laceration N/A N/A Wounding Event:  Venous Leg Ulcer N/A N/A Primary Etiology: Chronic Obstructive Pulmonary N/A N/A Comorbid History: Disease (COPD), Hypertension 05/03/2022 N/A N/A Date Acquired: 6 N/A N/A Weeks of Treatment: Open N/A N/A Wound Status: No N/A N/A Wound Recurrence: 0.3x0.3x0.1 N/A N/A Measurements L x W x D (cm) 0.071 N/A N/A A (cm) : rea 0.007 N/A N/A Volume (cm) : 96.70% N/A N/A % Reduction in Area: 98.90% N/A N/A % Reduction in Volume: Full Thickness Without Exposed N/A N/A Classification: Support Structures Medium N/A N/A Exudate Amount: Serosanguineous N/A N/A Exudate Type: red, brown N/A N/A Exudate Color: Small (1-33%) N/A N/A Granulation Amount: Red N/A N/A Granulation Quality: Small (1-33%) N/A N/A Necrotic Amount: Fat Layer (Subcutaneous Tissue): Yes N/A N/A Exposed Structures: Fascia: No Tendon: No Muscle: No Joint: No Bone: No Medium (34-66%) N/A N/A Epithelialization: Treatment Notes Electronic Signature(s) Signed: 07/30/2022 2:03:30 PM By: Massie Kluver Entered By: Massie Kluver on 07/29/2022 08:36:36 -------------------------------------------------------------------------------- Multi-Disciplinary Care Plan Details Patient Name: Date of Service: Billy Freeman, Billy Freeman 07/29/2022 8:15 A M Medical  Record Number: [id number] Patient Account Number: [id number] Date of Birth/Sex: Treating RN: Dec 04, 1950 (72 y.o. Seward Meth Primary Care Ivor Kishi: Tracie Harrier Other Clinician: Massie Kluver Referring Amias Hutchinson: Treating Gracen Southwell/Extender: Solon Palm Weeks in Treatment: 6 Active Inactive Necrotic Tissue Nursing Diagnoses: Impaired tissue integrity related to necrotic/devitalized tissue Knowledge deficit related to management of necrotic/devitalized tissue Goals: Necrotic/devitalized tissue will be minimized in the wound bed Date Initiated: 06/13/2022 Target Resolution Date: 07/13/2022 Goal Status: Active Patient/caregiver will verbalize understanding of reason and process for debridement of necrotic tissue Date Initiated: 06/13/2022 Target Resolution Date: 07/13/2022 Goal Status: Active Interventions: Assess patient pain level pre-, during and post procedure and prior to discharge Billy Freeman, Billy Freeman ([id number]) [id number] Page 6 of 10 Provide education on necrotic tissue and debridement process Treatment Activities: Apply topical anesthetic as ordered : 06/13/2022 Excisional debridement : 06/13/2022 Notes: Orientation to the Wound Care Program Nursing Diagnoses: Knowledge deficit related to the wound healing center program Goals: Patient/caregiver will verbalize understanding of the [facility] Date Initiated: 06/13/2022 Target Resolution Date: 06/27/2022 Goal Status: Active Interventions: Provide education on orientation to the wound center Notes: Venous Leg Ulcer Nursing Diagnoses: Potential for venous Insuffiency (use before diagnosis confirmed) Goals: Patient will maintain optimal edema control Date Initiated: 06/13/2022 Target Resolution Date: 07/13/2022 Goal Status: Active Patient/caregiver will verbalize understanding of disease process and disease management Date Initiated: 06/13/2022 Target  Resolution Date: 07/13/2022 Goal Status: Active Verify adequate tissue perfusion prior to therapeutic compression application Date Initiated: 06/13/2022 Target Resolution Date: 06/13/2022 Goal Status: Active Interventions: Assess peripheral edema status every visit. Compression as ordered Provide education on venous insufficiency Treatment Activities: Therapeutic compression applied : 06/13/2022 Notes: Wound/Skin Impairment Nursing Diagnoses: Impaired tissue integrity Knowledge deficit related to ulceration/compromised skin integrity Goals: Patient will demonstrate a reduced rate of smoking or cessation of smoking Date Initiated: 06/13/2022 Target Resolution Date: 07/13/2022 Goal Status: Active Patient will have a decrease in wound volume by X% from date: (specify in notes) Date Initiated: 06/13/2022 Target Resolution Date: 07/13/2022 Goal Status: Active Patient/caregiver will verbalize understanding of skin care regimen Date Initiated: 06/13/2022 Target Resolution Date: 07/13/2022 Goal Status: Active Ulcer/skin breakdown will have a volume reduction of 30% by week 4 Date Initiated: 06/13/2022 Target Resolution Date: 07/13/2022 Goal Status: Active Ulcer/skin breakdown will have a volume reduction of 50% by week 8 Date Initiated: 06/13/2022 Target Resolution Date: 08/13/2022 Goal Status: Active Ulcer/skin breakdown will have a volume reduction of 80% by  week 12 Date Initiated: 06/13/2022 Target Resolution Date: 09/13/2022 Goal Status: Active Ulcer/skin breakdown will heal within 14 weeks Date Initiated: 06/13/2022 Target Resolution Date: 09/27/2022 Goal Status: [address] [place], [place] ([id number]) [id number] Page 7 of 10 Interventions: Assess patient/caregiver ability to obtain necessary supplies Assess patient/caregiver ability to perform ulcer/skin care regimen upon admission and as needed Assess ulceration(s) every visit Provide education on  ulcer and skin care Notes: Electronic Signature(s) Signed: 07/29/2022 3:52:31 PM By: Rosalio Loud MSN RN CNS WTA Signed: 07/30/2022 2:03:30 PM By: Massie Kluver Entered By: Massie Kluver on 07/29/2022 08:44:26 -------------------------------------------------------------------------------- Pain Assessment Details Patient Name: Date of Service: Billy Freeman, Billy Freeman 07/29/2022 8:15 A M Medical Record Number: [id number] Patient Account Number: [id number] Date of Birth/Sex: Treating RN: 19-Aug-1950 (72 y.o. Seward Meth Primary Care Shamarie Call: Tracie Harrier Other Clinician: Massie Kluver Referring Janashia Parco: Treating Annaly Skop/Extender: Solon Palm Weeks in Treatment: 6 Active Problems Location of Pain Severity and Description of Pain Patient Has Paino No Site Locations Pain Management and Medication Current Pain Management: Electronic Signature(s) Signed: 07/29/2022 3:52:31 PM By: Rosalio Loud MSN RN CNS WTA Signed: 07/30/2022 2:03:30 PM By: Massie Kluver Entered By: Massie Kluver on 07/29/2022 08:30:48 Rosita Fire ([id number]) [id number] Page 8 of 10 -------------------------------------------------------------------------------- Patient/Caregiver Education Details Patient Name: Date of Service: Billy Freeman, Billy Freeman 1/2/2024andnbsp8:15 A M Medical Record Number: [id number] Patient Account Number: [id number] Date of Birth/Gender: Treating RN: Jun 24, 1951 (72 y.o. Seward Meth Primary Care Physician: Tracie Harrier Other Clinician: Massie Kluver Referring Physician: Treating Physician/Extender: Solon Palm Weeks in Treatment: 6 Education Assessment Education Provided To: Patient Education Topics Provided Wound/Skin Impairment: Handouts: Other: continue wound care as directed Methods: Explain/Verbal Responses: State content correctly Electronic Signature(s) Signed: 07/30/2022 2:03:30 PM By: Massie Kluver Entered By:  Massie Kluver on 07/29/2022 08:44:20 -------------------------------------------------------------------------------- Wound Assessment Details Patient Name: Date of Service: Billy Freeman, Billy Freeman 07/29/2022 8:15 A M Medical Record Number: [id number] Patient Account Number: [id number] Date of Birth/Sex: Treating RN: 1951-05-05 (72 y.o. Seward Meth Primary Care Zayonna Ayuso: Tracie Harrier Other Clinician: Massie Kluver Referring Sharalee Witman: Treating Daivon Rayos/Extender: Solon Palm Weeks in Treatment: 6 Wound Status Wound Number: 1 Primary Etiology: Venous Leg Ulcer Wound Location: Right, Medial Lower Leg Wound Status: Open Wounding Event: Laceration Comorbid Chronic Obstructive Pulmonary Disease (COPD), History: Hypertension Date Acquired: 05/03/2022 Weeks Of Treatment: 6 Clustered Wound: No [place], [place] ([id number]) [id number] Page 9 of 10 Wound Measurements Length: (cm) 0.3 Width: (cm) 0.3 Depth: (cm) 0.1 Area: (cm) 0.071 Volume: (cm) 0.007 % Reduction in Area: 96.7% % Reduction in Volume: 98.9% Epithelialization: Medium (34-66%) Tunneling: No Undermining: No Wound Description Classification: Full Thickness Without Exposed Support Structures Exudate Amount: Medium Exudate Type: Serosanguineous Exudate Color: red, brown Foul Odor After Cleansing: No Slough/Fibrino Yes Wound Bed Granulation Amount: Small (1-33%) Exposed Structure Granulation Quality: Red Fascia Exposed: No Necrotic Amount: Small (1-33%) Fat Layer (Subcutaneous Tissue) Exposed: Yes Necrotic Quality: Adherent Slough Tendon Exposed: No Muscle Exposed: No Joint Exposed: No Bone Exposed: No Treatment Notes Wound #1 (Lower Leg) Wound Laterality: Right, Medial Cleanser [place] Ancillary Kit - 15 Day Supply Discharge Instruction: Use supplies as instructed; Kit contains: (15) Saline Bullets; (15) 3x3 Gauze; 15 pr Gloves Peri-Wound Care Topical Primary  Dressing Prisma 4.34 (in) Discharge Instruction: Moisten w/normal saline or sterile water; Cover wound as directed. Do not remove from wound bed. Secondary Dressing ABD Pad 5x9 (in/in) Discharge Instruction: Cover with ABD pad Secured With Kerlix Roll Sterile or Non-Sterile 6-ply 4.5x4 (yd/yd) Discharge Instruction: Apply  Kerlix as directed Compression Wrap Compression Stockings Add-Ons Electronic Signature(s) Signed: 07/29/2022 3:52:31 PM By: Rosalio Loud MSN RN CNS WTA Signed: 07/30/2022 2:03:30 PM By: Massie Kluver Entered By: Massie Kluver on 07/29/2022 08:35:09 Rosita Fire ([id number]) [id number] Page 10 of 10 -------------------------------------------------------------------------------- Vitals Details Patient Name: Date of Service: Billy Freeman, Billy Freeman 07/29/2022 8:15 A M Medical Record Number: [id number] Patient Account Number: [id number] Date of Birth/Sex: Treating RN: Aug 02, 1950 (72 y.o. Seward Meth Primary Care Yoshika Vensel: Tracie Harrier Other Clinician: Massie Kluver Referring Vannak Montenegro: Treating Lempi Edwin/Extender: Solon Palm Weeks in Treatment: 6 Vital Signs Time Taken: 08:27 Pulse (bpm): 63 Height (in): 76 Respiratory Rate (breaths/min): 18 Weight (lbs): 208 Blood Pressure (mmHg): 116/76 Body Mass Index (BMI): 25.3 Reference Range: 80 - 120 mg / dl Electronic Signature(s) Signed: 07/30/2022 2:03:30 PM By: Massie Kluver Entered By: Massie Kluver on 07/29/2022 08:30:44

## 2022-08-05 ENCOUNTER — Encounter: Payer: Medicare Other | Admitting: Physician Assistant

## 2022-08-05 DIAGNOSIS — I87311 Chronic venous hypertension (idiopathic) with ulcer of right lower extremity: Secondary | ICD-10-CM | POA: Diagnosis not present

## 2022-08-05 NOTE — Progress Notes (Addendum)
Billy Freeman ([id number]) [id number] Page 1 of 6 Visit Report for 08/05/2022 Chief Complaint Document Details Patient Name: Date of Service: Billy Freeman, Billy Freeman 08/05/2022 9:15 A M Medical Record Number: [id number] Patient Account Number: [id number] Date of Birth/Sex: Treating RN: 1951/02/11 (71 y.o. Jerilynn Mages) Carlene Coria Primary Care Provider: Tracie Harrier Other Clinician: Massie Kluver Referring Provider: Treating Provider/Extender: Solon Palm Weeks in Treatment: 7 Information Obtained from: Patient Chief Complaint Right LE Ulcer Electronic Signature(s) Signed: 08/05/2022 9:34:38 AM By: Worthy Keeler PA-C Entered By: Worthy Keeler on 08/05/2022 09:34:38 -------------------------------------------------------------------------------- HPI Details Patient Name: Date of Service: Billy Freeman 08/05/2022 9:15 A M Medical Record Number: [id number] Patient Account Number: [id number] Date of Birth/Sex: Treating RN: 1951-04-17 (71 y.o. Jerilynn Mages) Carlene Coria Primary Care Provider: Tracie Harrier Other Clinician: Massie Kluver Referring Provider: Treating Provider/Extender: Solon Palm Weeks in Treatment: 7 History of Present Illness HPI Description: 06-13-2022 upon evaluation today patient appears to be doing poorly currently in regard to the wound on his leg. He subsequently tells me that he hit this on a piece of wood when he was putting something together this was around December 7. Subsequently he states that since that time he has been having issues with getting it to heal at 1 point he believes it was likely infected it was hurting quite severely. He ended up actually switching to bacitracin ointment at that point he tells me a lot of the severe pain improved following. With that being said he tells me that with what we are seeing here currently that he was still having a lot of issues with getting this to close and therefore  wanted to come into the wound care center as he has had a good experience with his mother before going to the wound care center in [place] where they were able to get the wound healed that otherwise had been recommended may require amputation. He has been in the hospital for his heart and tells me that during that time he had been previously that using Neosporin and a Band-Aid. In the hospital he was told to use Medihoney and a silver alginate to cover. With that being said he tells me that it really has not helped tremendously he eventually went to using the bacitracin which has been more recent. Patient does have a history of what appears to be venous insufficiency of the lower extremities he tells me has been trying to elevate his legs much as possible. He also has a history of atrial fibrillation, long-term use of anticoagulant therapy due to this he is on Eliquis, he does have hypertension, and COPD. He is also more recently been in the hospital for his heart he has a heart monitor on even now. 06-30-2022 upon evaluation today patient appears to be doing well currently in regard to his wound he seems to be making good progress which is great news. I see no signs of infection. Billy Freeman, Billy Freeman ([id number]) [id number] Page 2 of 6 07-07-2022 upon evaluation today patient appears to be doing excellent in regard to his wound. He has been tolerating the dressing changes without complication. Fortunately there does not appear to be any signs of active infection locally or systemically at this time which is great news. No fevers, chills, nausea, vomiting, or diarrhea. 07-14-2022 upon evaluation today patient appears to be doing excellent in regard to his wound. He has been tolerating the dressing changes without complication. This actually seems to be doing very well at this point.  07-29-2022 upon evaluation today patient appears to be doing well currently in regard to his wound  which is actually significantly smaller. I am very pleased with where we stand I do believe that he is making great progress currently. Fortunately I do not see any signs of active infection locally nor systemically at this time. No fevers, chills, nausea, vomiting, or diarrhea. 08-05-2022 upon evaluation today patient appears to be doing well currently in regard to his wound on the leg. He is actually showing signs of excellent improvement in fact I am not so certain this is not healed. Electronic Signature(s) Signed: 08/05/2022 10:17:05 AM By: Worthy Keeler PA-C Entered By: Worthy Keeler on 08/05/2022 10:17:05 -------------------------------------------------------------------------------- Physical Exam Details Patient Name: Date of Service: Billy Freeman, Billy Freeman 08/05/2022 9:15 A M Medical Record Number: [id number] Patient Account Number: [id number] Date of Birth/Sex: Treating RN: January 02, 1951 (71 y.o. Jerilynn Mages) Carlene Coria Primary Care Provider: Tracie Harrier Other Clinician: Massie Kluver Referring Provider: Treating Provider/Extender: Nadara Mustard, Vishwanath Weeks in Treatment: 7 Constitutional Well-nourished and well-hydrated in no acute distress. Respiratory normal breathing without difficulty. Psychiatric this patient is able to make decisions and demonstrates good insight into disease process. Alert and Oriented x 3. pleasant and cooperative. Notes Upon inspection patient's wound bed actually showed signs of good granulation epithelization at this point. Fortunately I do not see any evidence of infection locally or systemically which is great news and overall I am extremely pleased with where we stand. No fevers, chills, nausea, vomiting, or diarrhea. Electronic Signature(s) Signed: 08/05/2022 10:17:18 AM By: Worthy Keeler PA-C Entered By: Worthy Keeler on 08/05/2022 10:17:18 -------------------------------------------------------------------------------- Physician Orders  Details Patient Name: Date of Service: Billy Freeman, Billy Freeman 08/05/2022 9:15 A M Medical Record Number: [id number] Patient Account Number: [id number] Date of Birth/Sex: Treating RN: [birth year] ([address] y.o. 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 your wound has healed Wear compression garments daily. Put garments on first thing when you wake up and remove them before bed. Edema Control - Lymphedema / Segmental Compressive Device / Other Tubigrip single layer applied. - daily for the next month Additional Orders / Instructions Other: - cover area with a gauze or abd pad x 2 weeks for protection Electronic Signature(s) Signed: 08/05/2022 10:26:56 AM By: Massie Kluver Signed: 08/05/2022 2:11:00 PM By: Worthy Keeler PA-C Entered By: Massie Kluver on 08/05/2022 10:06:32 -------------------------------------------------------------------------------- Problem List Details Patient Name: Date of Service: Billy Freeman, Billy Freeman 08/05/2022 9:15 A M Medical Record Number: [id number] Patient Account Number: [id number] Date of Birth/Sex: Treating RN: [birth year] (71 y.o. Oval Linsey Primary Care  Provider: Tracie Harrier Other Clinician: Massie Kluver Referring Provider: Treating Provider/Extender: Solon Palm Weeks in Treatment: 7 Active Problems ICD-10 Encounter Code Description Active Date MDM Diagnosis I87.311 Chronic venous hypertension (idiopathic) with ulcer of right lower extremity 06/13/2022 No Yes S81.811A Laceration without foreign body, right lower  leg, initial encounter 06/13/2022 No Yes L97.812 Non-pressure chronic ulcer of other part of right lower leg with fat layer 06/13/2022 No Yes exposed ZACKERIAH, KISSLER ([id number]) [id number] Page 4 of 6 I48.0 Paroxysmal atrial fibrillation 06/13/2022 No Yes Z79.01 Long term (current) use of anticoagulants 06/13/2022 No Yes I10 Essential (primary) hypertension 06/13/2022 No Yes J44.9 Chronic obstructive pulmonary disease, unspecified 06/13/2022 No Yes Inactive Problems Resolved Problems Electronic Signature(s) Signed: 08/05/2022 9:34:34 AM By: Worthy Keeler PA-C Entered By: Worthy Keeler on 08/05/2022 09:34:34 -------------------------------------------------------------------------------- Progress Note Details Patient Name: Date of Service: Billy Freeman, Billy Freeman 08/05/2022 9:15 A M Medical Record Number: [id number] Patient Account Number: [id number] Date of Birth/Sex: Treating RN: 10/20/1950 (71 y.o. Jerilynn Mages) Carlene Coria Primary Care Provider: Tracie Harrier Other Clinician: Massie Kluver Referring Provider: Treating Provider/Extender: Solon Palm Weeks in Treatment: 7 Subjective Chief Complaint Information obtained from Patient Right LE Ulcer History of Present Illness (HPI) 06-13-2022 upon evaluation today patient appears to be doing poorly currently in regard to the wound on his leg. He subsequently tells me that he hit this on a piece of wood when he was putting something together this was around December 7. Subsequently he states that since that time  he has been having issues with getting it to heal at 1 point he believes it was likely infected it was hurting quite severely. He ended up actually switching to bacitracin ointment at that point he tells me a lot of the severe pain improved following. With that being said he tells me that with what we are seeing here currently that he was still having a lot of issues with getting this to close and therefore wanted to come into the wound care center as he has had a good experience with his mother before going to the wound care center in [place] where they were able to get the wound healed that otherwise had been recommended may require amputation. He has been in the hospital for his heart and tells me that during that time he had been previously that using Neosporin and a Band-Aid. In the hospital he was told to use Medihoney and a silver alginate to cover. With that being said he tells me that it really has not helped tremendously he eventually went to using the bacitracin which has been more recent. Patient does have a history of what appears to be venous insufficiency of the lower extremities he tells me has been trying to elevate his legs much as possible. He also has a history of atrial fibrillation, long-term use of anticoagulant therapy due to this he is on Eliquis, he does have hypertension, and COPD. He is also more recently been in the hospital for his heart he has a heart monitor on even now. 06-30-2022 upon evaluation today patient appears to be doing well currently in regard to his wound he seems to be making good progress which is great news. I see no signs of infection. 07-07-2022 upon evaluation today patient appears to be doing excellent in regard to his wound. He has been tolerating the dressing changes without complication. Fortunately there does not appear to be any signs of active infection locally or systemically at this time which is great news. No fevers, chills, nausea, vomiting,  or diarrhea. 07-14-2022 upon evaluation today patient appears to be doing excellent in regard to his wound. He has been tolerating the dressing changes without complication. This actually seems to be doing very well at this point. 07-29-2022 upon evaluation today patient  appears to be doing well currently in regard to his wound which is actually significantly smaller. I am very pleased with Billy Freeman, Billy Freeman ([id number]) [id number] Page 5 of 6 where we stand I do believe that he is making great progress currently. Fortunately I do not see any signs of active infection locally nor systemically at this time. No fevers, chills, nausea, vomiting, or diarrhea. 08-05-2022 upon evaluation today patient appears to be doing well currently in regard to his wound on the leg. He is actually showing signs of excellent improvement in fact I am not so certain this is not healed. Objective Constitutional Well-nourished and well-hydrated in no acute distress. Vitals Time Taken: 9:30 AM, Height: 76 in, Weight: 208 lbs, BMI: 25.3, Temperature: 98.1 F, Pulse: 62 bpm, Respiratory Rate: 16 breaths/min, Blood Pressure: 95/63 mmHg. Respiratory normal breathing without difficulty. Psychiatric this patient is able to make decisions and demonstrates good insight into disease process. Alert and Oriented x 3. pleasant and cooperative. General Notes: Upon inspection patient's wound bed actually showed signs of good granulation epithelization at this point. Fortunately I do not see any evidence of infection locally or systemically which is great news and overall I am extremely pleased with where we stand. No fevers, chills, nausea, vomiting, or diarrhea. Integumentary (Hair, Skin) Wound #1 status is Healed - Epithelialized. Original cause of wound was Laceration. The date acquired was: 05/03/2022. The wound has been in treatment 7 weeks. The wound is located on the Right,Medial Lower Leg. The wound  measures 0cm length x 0cm width x 0cm depth; 0cm^2 area and 0cm^3 volume. There is Fat Layer (Subcutaneous Tissue) exposed. There is a none present amount of drainage noted. There is no granulation within the wound bed. There is no necrotic tissue within the wound bed. Assessment Active Problems ICD-10 Chronic venous hypertension (idiopathic) with ulcer of right lower extremity Laceration without foreign body, right lower leg, initial encounter Non-pressure chronic ulcer of other part of right lower leg with fat layer exposed Paroxysmal atrial fibrillation Long term (current) use of anticoagulants Essential (primary) hypertension Chronic obstructive pulmonary disease, unspecified Plan Discharge From [facility]: Discharge from [facility]! your wound has healed Wear compression garments daily. Put garments on first thing when you wake up and remove them before bed. Edema Control - Lymphedema / Segmental Compressive Device / Other: Tubigrip single layer applied. - daily for the next month Additional Orders / Instructions: Other: - cover area with a gauze or abd pad x 2 weeks for protection 1. I am going to suggest based on what we are seeing that we go ahead and have the patient discontinue wound care services he does appear to be completely healed. 2. He should continue to monitor for any signs of infection or worsening. With that being said I do think an ABD pad just for protection sake and then subsequently using the Tubigrip to keep swelling under control for the next month would be a good idea he will just use the ABD pad for 2 weeks and then the additional 2 weeks will be just with the Tubigrip then he should not have to cover this with anything. We will see patient back for reevaluation in 1 week here in the clinic. If anything worsens or changes patient will contact our office for additional recommendations. Electronic  Signature(s) Signed: 08/05/2022 10:18:01 AM By: Jaynee Eagles, Gwenlyn Perking ([id number]) [id number] Page 6 of 6 Entered By: Worthy Keeler on 08/05/2022 10:18:01 --------------------------------------------------------------------------------  SuperBill Details Patient Name: Date of Service: Billy Freeman, Billy Freeman 08/05/2022 Medical Record Number: [id number] Patient Account Number: [id number] Date of Birth/Sex: Treating RN: 1951/07/26 (71 y.o. Jerilynn Mages) Carlene Coria Primary Care Provider: Tracie Harrier Other Clinician: Massie Kluver Referring Provider: Treating Provider/Extender: Solon Palm Weeks in Treatment: 7 Diagnosis Coding ICD-10 Codes Code Description I87.311 Chronic venous hypertension (idiopathic) with ulcer of right lower extremity S81.811A Laceration without foreign body, right lower leg, initial encounter L97.812 Non-pressure chronic ulcer of other part of right lower leg with fat layer exposed I48.0 Paroxysmal atrial fibrillation Z79.01 Long term (current) use of anticoagulants I10 Essential (primary) hypertension J44.9 Chronic obstructive pulmonary disease, unspecified Facility Procedures : CPT4 Code: [id number] Description: [phone] - WOUND CARE VISIT-LEV 2 EST PT Modifier: Quantity: 1 Physician Procedures : CPT4 Code Description Modifier [id number] - WC PHYS LEVEL 3 - EST PT ICD-10 Diagnosis Description I87.311 Chronic venous hypertension (idiopathic) with ulcer of right lower extremity S81.811A Laceration without foreign body, right lower leg, initial  encounter L97.812 Non-pressure chronic ulcer of other part of right lower leg with fat layer exposed I48.0 Paroxysmal atrial fibrillation Quantity: 1 Electronic Signature(s) Signed: 08/05/2022 10:18:33 AM By: Worthy Keeler PA-C Entered By: Worthy Keeler on 08/05/2022 10:18:33

## 2022-08-07 DIAGNOSIS — Z01818 Encounter for other preprocedural examination: Secondary | ICD-10-CM | POA: Insufficient documentation

## 2022-08-07 NOTE — Progress Notes (Signed)
PHARELL, ROLFSON ([id number]) [id number] Page 1 of 8 Visit Report for 08/05/2022 Arrival Information Details Patient Name: Date of Service: Billy Freeman, Billy Freeman 08/05/2022 9:15 A M Medical Record Number: [id number] Patient Account Number: [id number] Date of Birth/Sex: Treating RN: 03/11/1951 (71 y.o. Jerilynn Mages) Carlene Coria Primary Care Sascha Baugher: Tracie Harrier Other Clinician: Massie Kluver Referring Dajon Rowe: Treating Avrie Kedzierski/Extender: Solon Palm Weeks in Treatment: 7 Visit Information History Since Last Visit All ordered tests and consults were completed: No Patient Arrived: Ambulatory Added or deleted any medications: No Arrival Time: 09:24 Any new allergies or adverse reactions: No Transfer Assistance: None Had a fall or experienced change in No Patient Identification Verified: Yes activities of daily living that may affect Secondary Verification Process Completed: Yes risk of falls: Patient Requires Transmission-Based Precautions: No Signs or symptoms of abuse/neglect since last visito No Patient Has Alerts: Yes Hospitalized since last visit: No Patient Alerts: Patient on Blood Thinner Implantable device outside of the clinic excluding No NOT Diabetic cellular tissue based products placed in the center Eliquis since last visit: Has Dressing in Place as Prescribed: Yes Has Compression in Place as Prescribed: Yes Pain Present Now: No Electronic Signature(s) Signed: 08/05/2022 10:26:56 AM By: Massie Kluver Entered By: Massie Kluver on 08/05/2022 09:29:41 -------------------------------------------------------------------------------- Clinic Level of Care Assessment Details Patient Name: Date of Service: Billy Freeman, Billy Freeman 08/05/2022 9:15 A M Medical Record Number: [id number] Patient Account Number: [id number] Date of Birth/Sex: Treating RN: Aug 24, 1950 (71 y.o. Jerilynn Mages) Carlene Coria Primary Care Janesha Brissette: Tracie Harrier Other Clinician: Massie Kluver Referring Able Malloy: Treating Kwinton Maahs/Extender: Nadara Mustard, Vishwanath Weeks in Treatment: 7 Clinic Level of Care Assessment Items TOOL 4 Quantity Score '[]'$  - 0 Use when only an EandM is performed on FOLLOW-UP visit ASSESSMENTS - Nursing Assessment / Reassessment X- 1 10 Reassessment of Co-morbidities (includes updates in patient status) JIDENNA, FIGGS ([id number]) [id number] Page 2 of 8 X- 1 5 Reassessment of Adherence to Treatment Plan ASSESSMENTS - Wound and Skin A ssessment / Reassessment X - Simple Wound Assessment / Reassessment - one wound 1 5 '[]'$  - 0 Complex Wound Assessment / Reassessment - multiple wounds '[]'$  - 0 Dermatologic / Skin Assessment (not related to wound area) ASSESSMENTS - Focused Assessment '[]'$  - 0 Circumferential Edema Measurements - multi extremities '[]'$  - 0 Nutritional Assessment / Counseling / Intervention '[]'$  - 0 Lower Extremity Assessment (monofilament, tuning fork, pulses) '[]'$  - 0 Peripheral Arterial Disease Assessment (using hand held doppler) ASSESSMENTS - Ostomy and/or Continence Assessment and Care '[]'$  - 0 Incontinence Assessment and Management '[]'$  - 0 Ostomy Care Assessment and Management (repouching, etc.) PROCESS - Coordination of Care X - Simple Patient / Family Education for ongoing care 1 15 '[]'$  - 0 Complex (extensive) Patient / Family Education for ongoing care '[]'$  - 0 Staff obtains Programmer, systems, Records, T Results / Process Orders est '[]'$  - 0 Staff telephones HHA, Nursing Homes / Clarify orders / etc '[]'$  - 0 Routine Transfer to another Facility (non-emergent condition) '[]'$  - 0 Routine Hospital Admission (non-emergent condition) '[]'$  - 0 New Admissions / Biomedical engineer / Ordering NPWT Apligraf, etc. , '[]'$  - 0 Emergency Hospital Admission (emergent condition) X- 1 10 Simple Discharge Coordination '[]'$  - 0 Complex (extensive) Discharge Coordination PROCESS - Special Needs '[]'$  - 0 Pediatric /  Minor Patient Management '[]'$  - 0 Isolation Patient Management '[]'$  - 0 Hearing / Language / Visual special needs '[]'$  - 0 Assessment of Community assistance (transportation, D/C planning, etc.) '[]'$  - 0 Additional assistance / Altered mentation '[]'$  -  0 Support Surface(s) Assessment (bed, cushion, seat, etc.) INTERVENTIONS - Wound Cleansing / Measurement X - Simple Wound Cleansing - one wound 1 5 '[]'$  - 0 Complex Wound Cleansing - multiple wounds X- 1 5 Wound Imaging (photographs - any number of wounds) '[]'$  - 0 Wound Tracing (instead of photographs) X- 1 5 Simple Wound Measurement - one wound '[]'$  - 0 Complex Wound Measurement - multiple wounds INTERVENTIONS - Wound Dressings X - Small Wound Dressing one or multiple wounds 1 10 '[]'$  - 0 Medium Wound Dressing one or multiple wounds '[]'$  - 0 Large Wound Dressing one or multiple wounds '[]'$  - 0 Application of Medications - topical '[]'$  - 0 Application of Medications - injection INTERVENTIONS - [place], [place] ([id number]) [id number] Page 3 of 8 '[]'$  - 0 External ear exam '[]'$  - 0 Specimen Collection (cultures, biopsies, blood, body fluids, etc.) '[]'$  - 0 Specimen(s) / Culture(s) sent or taken to Lab for analysis '[]'$  - 0 Patient Transfer (multiple staff / Harrel Lemon Lift / Similar devices) '[]'$  - 0 Simple Staple / Suture removal (25 or less) '[]'$  - 0 Complex Staple / Suture removal (26 or more) '[]'$  - 0 Hypo / Hyperglycemic Management (close monitor of Blood Glucose) '[]'$  - 0 Ankle / Brachial Index (ABI) - do not check if billed separately X- 1 5 Vital Signs Has the patient been seen at the hospital within the last three years: Yes Total Score: 75 Level Of Care: New/Established - Level 2 Electronic Signature(s) Signed: 08/05/2022 10:26:56 AM By: Massie Kluver Entered By: Massie Kluver on 08/05/2022 10:07:09 -------------------------------------------------------------------------------- Encounter Discharge  Information Details Patient Name: Date of Service: Billy Freeman, Billy Freeman 08/05/2022 9:15 A M Medical Record Number: [id number] Patient Account Number: [id number] Date of Birth/Sex: Treating RN: 31-Oct-1950 (71 y.o. Jerilynn Mages) Carlene Coria Primary Care Albina Gosney: Tracie Harrier Other Clinician: Massie Kluver Referring Londa Mackowski: Treating Emira Eubanks/Extender: Solon Palm Weeks in Treatment: 7 Encounter Discharge Information Items Discharge Condition: Stable Ambulatory Status: Ambulatory Discharge Destination: Home Transportation: Private Auto Accompanied By: self Schedule Follow-up Appointment: No Clinical Summary of Care: Electronic Signature(s) Signed: 08/05/2022 10:26:56 AM By: Massie Kluver Entered By: Massie Kluver on 08/05/2022 10:16:06 Rosita Fire ([id number]) [id number] Page 4 of 8 -------------------------------------------------------------------------------- Lower Extremity Assessment Details Patient Name: Date of Service: Billy Freeman, Billy Freeman 08/05/2022 9:15 A M Medical Record Number: [id number] Patient Account Number: [id number] Date of Birth/Sex: Treating RN: 05-31-1951 (71 y.o. Jerilynn Mages) Carlene Coria Primary Care Yong Grieser: Tracie Harrier Other Clinician: Massie Kluver Referring Tonie Elsey: Treating Rafik Koppel/Extender: Nadara Mustard, Vishwanath Weeks in Treatment: 7 Edema Assessment Assessed: [Left: No] [Right: Yes] Edema: [Left: Ye] [Right: s] Calf Left: Right: Point of Measurement: 34 cm From Medial Instep 37.4 cm Ankle Left: Right: Point of Measurement: 12 cm From Medial Instep 24.7 cm Vascular Assessment Pulses: Dorsalis Pedis Palpable: [Right:Yes] Electronic Signature(s) Signed: 08/05/2022 10:26:56 AM By: Massie Kluver Signed: 08/07/2022 3:13:07 PM By: Carlene Coria RN Entered By: Massie Kluver on 08/05/2022 09:38:54 -------------------------------------------------------------------------------- Multi Wound Chart Details Patient  Name: Date of Service: Billy Freeman, Billy Freeman 08/05/2022 9:15 A M Medical Record Number: [id number] Patient Account Number: [id number] Date of Birth/Sex: Treating RN: 1951/06/02 (71 y.o. Jerilynn Mages) Carlene Coria Primary Care Chia Rock: Tracie Harrier Other Clinician: Massie Kluver Referring Ovie Eastep: Treating Rhodes Calvert/Extender: Nadara Mustard, Vishwanath Weeks in Treatment: 7 Vital Signs Height(in): 76 Pulse(bpm): 62 Weight(lbs): 208 Blood Pressure(mmHg): 95/63 Body Mass Index(BMI): 25.3 Temperature(F): 98.1 Respiratory Rate(breaths/min): 16 [1:Photos:] [N/A:N/A] Right, Medial Lower Leg N/A N/A Wound Location: Laceration N/A N/A Wounding Event: Venous Leg Ulcer  N/A N/A Primary Etiology: Chronic Obstructive Pulmonary N/A N/A Comorbid History: Disease (COPD), Hypertension 05/03/2022 N/A N/A Date Acquired: 7 N/A N/A Weeks of Treatment: Open N/A N/A Wound Status: No N/A N/A Wound Recurrence: 0.1x0.2x0.1 N/A N/A Measurements L x W x D (cm) 0.016 N/A N/A A (cm) : rea 0.002 N/A N/A Volume (cm) : 99.30% N/A N/A % Reduction in Area: 99.70% N/A N/A % Reduction in Volume: Full Thickness Without Exposed N/A N/A Classification: Support Structures Medium N/A N/A Exudate Amount: Serosanguineous N/A N/A Exudate Type: red, brown N/A N/A Exudate Color: Small (1-33%) N/A N/A Granulation Amount: Red N/A N/A Granulation Quality: Small (1-33%) N/A N/A Necrotic Amount: Fat Layer (Subcutaneous Tissue): Yes N/A N/A Exposed Structures: Fascia: No Tendon: No Muscle: No Joint: No Bone: No Medium (34-66%) N/A N/A Epithelialization: Treatment Notes Electronic Signature(s) Signed: 08/05/2022 10:26:56 AM By: Massie Kluver Entered By: Massie Kluver on 08/05/2022 09:39:03 -------------------------------------------------------------------------------- Multi-Disciplinary Care Plan Details Patient Name: Date of Service: Billy Freeman, Billy Freeman 08/05/2022 9:15 A M Medical Record Number:  [id number] Patient Account Number: [id number] Date of Birth/Sex: Treating RN: [birth year] (71 y.o. Jerilynn Mages) Carlene Coria Primary Care Dmetrius Ambs: Tracie Harrier Other Clinician: Massie Kluver Referring Magdalen Cabana: Treating Tulsi Crossett/Extender: Nadara Mustard, Vishwanath Weeks in Treatment: 7 Active Inactive Electronic Signature(s) Signed: 08/05/2022 10:26:56 AM By: Massie Kluver Signed: 08/07/2022 3:13:07 PM By: Carlene Coria RN Entered By: Massie Kluver on 08/05/2022 10:08:06 Rosita Fire ([id number]) [id number] Page 6 of 8 -------------------------------------------------------------------------------- Pain Assessment Details Patient Name: Date of Service: Billy Freeman, Billy Freeman 08/05/2022 9:15 A M Medical Record Number: [id number] Patient Account Number: [id number] Date of Birth/Sex: Treating RN: [birth year] (71 y.o. Jerilynn Mages) Carlene Coria Primary Care Lucile Didonato: Tracie Harrier Other Clinician: Massie Kluver Referring Marra Fraga: Treating Gia Lusher/Extender: Solon Palm Weeks in Treatment: 7 Active Problems Location of Pain Severity and Description of Pain Patient Has Paino No Site Locations Pain Management and Medication Current Pain Management: Electronic Signature(s) Signed: 08/05/2022 10:26:56 AM By: Massie Kluver Signed: 08/07/2022 3:13:07 PM By: Carlene Coria RN Entered By: Massie Kluver on 08/05/2022 09:32:46 -------------------------------------------------------------------------------- Patient/Caregiver Education Details Patient Name: Date of Service: Billy Freeman 1/9/2024andnbsp9:15 [place] Record Number: [id number] Patient Account Number: [id number] Date of Birth/Gender: Treating RN: 02-24-1951 (71 y.o. Oval Linsey Primary Care Physician: Tracie Harrier Other Clinician: Massie Kluver Referring Physician: Treating Physician/Extender: Solon Palm Weeks in Treatment: [place], Gwenlyn Perking ([id number])  [id number] Page 7 of 8 Education Assessment Education Provided To: Patient Education Topics Provided Wound/Skin Impairment: Handouts: Other: your wound has healed. please call if any further issues arise Methods: Explain/Verbal Responses: State content correctly Electronic Signature(s) Signed: 08/05/2022 10:26:56 AM By: Massie Kluver Entered By: Massie Kluver on 08/05/2022 10:07:42 -------------------------------------------------------------------------------- Wound Assessment Details Patient Name: Date of Service: Billy Freeman, Billy Freeman 08/05/2022 9:15 A M Medical Record Number: [id number] Patient Account Number: [id number] Date of Birth/Sex: Treating RN: 18-Apr-1951 (71 y.o. Jerilynn Mages) Carlene Coria Primary Care Zebulen Simonis: Tracie Harrier Other Clinician: Massie Kluver Referring Nima Kemppainen: Treating Kyndall Chaplin/Extender: Nadara Mustard, Vishwanath Weeks in Treatment: 7 Wound Status Wound Number: 1 Primary Etiology: Venous Leg Ulcer Wound Location: Right, Medial Lower Leg Wound Status: Healed - Epithelialized Wounding Event: Laceration Comorbid Chronic Obstructive Pulmonary Disease (COPD), History: Hypertension Date Acquired: 05/03/2022 Weeks Of Treatment: 7 Clustered Wound: No Photos Wound Measurements Length: (cm) Width: (cm) Depth: (cm) Area: (cm) Volume: (cm) 0 % Reduction in Area: 100% 0 % Reduction in Volume: 100% 0 Epithelialization: Large (67-100%) 0 0 Wound Description Classification: Full Thickness Without Exposed Support Structures Exudate  Amount: None Present Billy Freeman, Billy Freeman ([id number]) Foul Odor After Cleansing: No Slough/Fibrino No [id number] Page 8 of 8 Wound Bed Granulation Amount: None Present (0%) Exposed Structure Necrotic Amount: None Present (0%) Fascia Exposed: No Fat Layer (Subcutaneous Tissue) Exposed: Yes Tendon Exposed: No Muscle Exposed: No Joint Exposed: No Bone Exposed: No Treatment  Notes Wound #1 (Lower Leg) Wound Laterality: Right, Medial Cleanser Peri-Wound Care Topical Primary Dressing Secondary Dressing Secured With Compression Wrap Compression Stockings Add-Ons Electronic Signature(s) Signed: 08/05/2022 10:26:56 AM By: Massie Kluver Signed: 08/07/2022 3:13:07 PM By: Carlene Coria RN Entered By: Massie Kluver on 08/05/2022 10:04:37 -------------------------------------------------------------------------------- Vitals Details Patient Name: Date of Service: Billy Freeman, Billy Freeman 08/05/2022 9:15 A M Medical Record Number: [id number] Patient Account Number: [id number] Date of Birth/Sex: Treating RN: 1951-05-11 (71 y.o. Jerilynn Mages) Carlene Coria Primary Care Loredana Medellin: Tracie Harrier Other Clinician: Massie Kluver Referring Tip Atienza: Treating Suki Crockett/Extender: Nadara Mustard, Vishwanath Weeks in Treatment: 7 Vital Signs Time Taken: 09:30 Temperature (F): 98.1 Height (in): 76 Pulse (bpm): 62 Weight (lbs): 208 Respiratory Rate (breaths/min): 16 Body Mass Index (BMI): 25.3 Blood Pressure (mmHg): 95/63 Reference Range: 80 - 120 mg / dl Electronic Signature(s) Signed: 08/05/2022 10:26:56 AM By: Massie Kluver Entered By: Massie Kluver on 08/05/2022 09:32:32

## 2022-08-15 ENCOUNTER — Ambulatory Visit: Payer: Medicare Other

## 2022-08-19 ENCOUNTER — Ambulatory Visit (LOCAL_COMMUNITY_HEALTH_CENTER): Payer: Medicare Other

## 2022-08-19 ENCOUNTER — Ambulatory Visit: Payer: Medicare Other

## 2022-08-19 DIAGNOSIS — Z719 Counseling, unspecified: Secondary | ICD-10-CM

## 2022-08-19 DIAGNOSIS — Z23 Encounter for immunization: Secondary | ICD-10-CM

## 2022-08-19 NOTE — Progress Notes (Signed)
Patient seen in nurse clinic for second Hep A/Hep B vaccine. Patient declined VIS - already has them from 06/2022 vaccinations. Twinrix given IM in left deltoid. Tolerated well. [facility] updated and copies provided. Discussed return visit for next vaccine dose.

## 2022-09-01 DIAGNOSIS — Z8679 Personal history of other diseases of the circulatory system: Secondary | ICD-10-CM | POA: Insufficient documentation

## 2022-09-08 ENCOUNTER — Inpatient Hospital Stay: Admission: RE | Admit: 2022-09-08 | Payer: Medicare Other | Source: Ambulatory Visit

## 2022-09-09 ENCOUNTER — Telehealth (HOSPITAL_COMMUNITY): Payer: Self-pay | Admitting: Pharmacy Technician

## 2022-09-09 NOTE — Telephone Encounter (Signed)
Auth Submission: NO AUTH NEEDED Payer: Medicare B  [employer] FEP plan Medication & CPT/J Code(s) submitted: GLASSIA C508661 Route of submission (phone, fax, portal): Phone Phone # [phone], option 2 Auth type: Buy/Bill Units/visits requested: ~60 mg/kg Q week, 11 months (46 visits) Reference number for call: [id number] Rep: Bethanne Ginger   Medicare B will cover 80%, [employer] FEP will pick up remaining 20% (pending any copay/coinsurance and any OOP balances)

## 2022-09-09 NOTE — Telephone Encounter (Signed)
Auth Submission: NO AUTH NEEDED Payer: Medicare B  [employer]BS FEP plan Medication & CPT/J Code(s) submitted: GLASSIA [id number] or Prolastin-C J0256 Route of submission (phone, fax, portal): Phone Phone # [phone], option 2 Auth type: Buy/Bill Units/visits requested: ~60 mg/kg Q week, 11 months (46 visits) Reference number for call: [id number] Rep: Bethanne Ginger     Medicare B will cover 80%, BCBS FEP will pick up remaining 20% (pending any copay/coinsurance and any OOP balances)

## 2022-09-29 ENCOUNTER — Ambulatory Visit
Admission: RE | Admit: 2022-09-29 | Discharge: 2022-09-29 | Disposition: A | Discharge status: Home / Self Care | Payer: Medicare Other | Source: Ambulatory Visit | Attending: Student in an Organized Health Care Education/Training Program | Admitting: Student in an Organized Health Care Education/Training Program

## 2022-09-29 DIAGNOSIS — E8801 Alpha-1-antitrypsin deficiency: Secondary | ICD-10-CM | POA: Diagnosis present

## 2022-09-29 MED ORDER — FAMOTIDINE IN NACL 20-0.9 MG/50ML-% IV SOLN: 20.0000 mg | Freq: Once | INTRAVENOUS | Status: DC | PRN

## 2022-09-29 MED ORDER — ALPHA1-PROTEINASE INHIBITOR 1000 MG/20ML IV SOLN: 5120.0000 mg | INTRAVENOUS | Status: DC

## 2022-09-29 MED ORDER — EPINEPHRINE 0.3 MG/0.3ML IJ SOAJ: 0.3000 mg | Freq: Once | INTRAMUSCULAR | Status: DC | PRN

## 2022-09-29 MED ORDER — DIPHENHYDRAMINE HCL 50 MG/ML IJ SOLN: 50.0000 mg | Freq: Once | INTRAMUSCULAR | Status: DC | PRN

## 2022-09-29 MED ORDER — ALPHA1-PROTEINASE INHIBITOR 1000 MG/20ML IV SOLN
5120.0000 mg | INTRAVENOUS | Status: DC
  Administered 2022-09-29: 5120 mg via INTRAVENOUS
  Filled 2022-09-29: qty 6000
  Filled 2022-09-29: qty 5120

## 2022-09-29 MED ORDER — METHYLPREDNISOLONE SODIUM SUCC 125 MG IJ SOLR: 125.0000 mg | Freq: Once | INTRAMUSCULAR | Status: DC | PRN

## 2022-09-29 MED ORDER — ALBUTEROL SULFATE (2.5 MG/3ML) 0.083% IN NEBU: 2.5000 mg | INHALATION_SOLUTION | Freq: Once | RESPIRATORY_TRACT | Status: DC | PRN

## 2022-10-06 ENCOUNTER — Ambulatory Visit
Admission: RE | Admit: 2022-10-06 | Discharge: 2022-10-06 | Disposition: A | Discharge status: Home / Self Care | Payer: Medicare Other | Source: Ambulatory Visit | Attending: Internal Medicine | Admitting: Internal Medicine

## 2022-10-06 DIAGNOSIS — E8801 Alpha-1-antitrypsin deficiency: Secondary | ICD-10-CM | POA: Insufficient documentation

## 2022-10-06 MED ORDER — ALPHA1-PROTEINASE INHIBITOR 1000 MG/20ML IV SOLN
5120.0000 mg | INTRAVENOUS | Status: DC
  Administered 2022-10-06: 5120 mg via INTRAVENOUS
  Filled 2022-10-06: qty 6000
  Filled 2022-10-06: qty 5120
  Filled 2022-10-06: qty 6000

## 2022-10-13 ENCOUNTER — Ambulatory Visit
Admission: RE | Admit: 2022-10-13 | Discharge: 2022-10-13 | Disposition: A | Discharge status: Home / Self Care | Payer: Medicare Other | Source: Ambulatory Visit | Attending: Student in an Organized Health Care Education/Training Program | Admitting: Student in an Organized Health Care Education/Training Program

## 2022-10-13 MED ORDER — ALPHA1-PROTEINASE INHIBITOR 1000 MG/20ML IV SOLN: 5000.0000 mg | INTRAVENOUS | Status: DC

## 2022-10-14 ENCOUNTER — Ambulatory Visit: Payer: Medicare Other

## 2022-10-15 ENCOUNTER — Ambulatory Visit
Admission: RE | Admit: 2022-10-15 | Discharge: 2022-10-15 | Disposition: A | Discharge status: Home / Self Care | Payer: Medicare Other | Source: Ambulatory Visit | Attending: Student in an Organized Health Care Education/Training Program | Admitting: Student in an Organized Health Care Education/Training Program

## 2022-10-15 DIAGNOSIS — E8801 Alpha-1-antitrypsin deficiency: Secondary | ICD-10-CM | POA: Insufficient documentation

## 2022-10-15 DIAGNOSIS — J439 Emphysema, unspecified: Secondary | ICD-10-CM | POA: Diagnosis not present

## 2022-10-15 MED ORDER — ALPHA1-PROTEINASE INHIBITOR 1000 MG/20ML IV SOLN
5055.0000 mg | INTRAVENOUS | Status: DC
  Administered 2022-10-15: 5055 mg via INTRAVENOUS
  Filled 2022-10-15: qty 5055
  Filled 2022-10-15: qty 5000

## 2022-10-20 ENCOUNTER — Ambulatory Visit
Admission: RE | Admit: 2022-10-20 | Discharge: 2022-10-20 | Disposition: A | Discharge status: Home / Self Care | Payer: Medicare Other | Source: Ambulatory Visit | Attending: Student in an Organized Health Care Education/Training Program | Admitting: Student in an Organized Health Care Education/Training Program

## 2022-10-20 DIAGNOSIS — E8801 Alpha-1-antitrypsin deficiency: Secondary | ICD-10-CM | POA: Diagnosis present

## 2022-10-20 MED ORDER — EMPTY CONTAINERS FLEXIBLE MISC
5055.0000 mg | Freq: Once | Status: AC
  Administered 2022-10-20: 5055 mg via INTRAVENOUS
  Filled 2022-10-20: qty 5055

## 2022-10-20 MED ORDER — ALPHA1-PROTEINASE INHIBITOR 1000 MG/20ML IV SOLN
5000.0000 mg | Freq: Once | INTRAVENOUS | Status: DC
  Filled 2022-10-20 (×2): qty 5000

## 2022-10-27 ENCOUNTER — Ambulatory Visit
Admission: RE | Admit: 2022-10-27 | Discharge: 2022-10-27 | Disposition: A | Discharge status: Home / Self Care | Payer: Medicare Other | Source: Ambulatory Visit | Attending: Student in an Organized Health Care Education/Training Program | Admitting: Student in an Organized Health Care Education/Training Program

## 2022-10-27 DIAGNOSIS — E8801 Alpha-1-antitrypsin deficiency: Secondary | ICD-10-CM | POA: Insufficient documentation

## 2022-10-27 DIAGNOSIS — J439 Emphysema, unspecified: Secondary | ICD-10-CM | POA: Diagnosis not present

## 2022-10-27 MED ORDER — ALPHA1-PROTEINASE INHIBITOR 1000 MG/20ML IV SOLN
5055.0000 mg | INTRAVENOUS | Status: DC
  Administered 2022-10-27: 5055 mg via INTRAVENOUS
  Filled 2022-10-27: qty 5000
  Filled 2022-10-27: qty 5055

## 2022-11-03 ENCOUNTER — Ambulatory Visit
Admission: RE | Admit: 2022-11-03 | Discharge: 2022-11-03 | Disposition: A | Discharge status: Home / Self Care | Payer: Medicare Other | Source: Ambulatory Visit | Attending: Student in an Organized Health Care Education/Training Program | Admitting: Student in an Organized Health Care Education/Training Program

## 2022-11-03 DIAGNOSIS — E8801 Alpha-1-antitrypsin deficiency: Secondary | ICD-10-CM | POA: Diagnosis present

## 2022-11-03 MED ORDER — ALPHA1-PROTEINASE INHIBITOR 1000 MG/20ML IV SOLN
5075.0000 mg | Freq: Once | INTRAVENOUS | Status: AC
  Administered 2022-11-03: 5075 mg via INTRAVENOUS
  Filled 2022-11-03: qty 5075

## 2022-11-10 ENCOUNTER — Ambulatory Visit
Admission: RE | Admit: 2022-11-10 | Discharge: 2022-11-10 | Disposition: A | Discharge status: Home / Self Care | Payer: Medicare Other | Source: Ambulatory Visit | Attending: Student in an Organized Health Care Education/Training Program | Admitting: Student in an Organized Health Care Education/Training Program

## 2022-11-10 DIAGNOSIS — E8801 Alpha-1-antitrypsin deficiency: Secondary | ICD-10-CM | POA: Insufficient documentation

## 2022-11-10 MED ORDER — ALPHA1-PROTEINASE INHIBITOR 1000 MG/20ML IV SOLN
5075.0000 mg | Freq: Once | INTRAVENOUS | Status: AC
  Administered 2022-11-10: 5075 mg via INTRAVENOUS
  Filled 2022-11-10 (×2): qty 6000
  Filled 2022-11-10 (×3): qty 5075

## 2022-11-17 ENCOUNTER — Ambulatory Visit
Admission: RE | Admit: 2022-11-17 | Discharge: 2022-11-17 | Disposition: A | Discharge status: Home / Self Care | Payer: Medicare Other | Source: Ambulatory Visit | Attending: Student in an Organized Health Care Education/Training Program | Admitting: Student in an Organized Health Care Education/Training Program

## 2022-11-17 DIAGNOSIS — E8801 Alpha-1-antitrypsin deficiency: Secondary | ICD-10-CM | POA: Insufficient documentation

## 2022-11-17 DIAGNOSIS — J439 Emphysema, unspecified: Secondary | ICD-10-CM | POA: Diagnosis not present

## 2022-11-17 MED ORDER — EMPTY CONTAINERS FLEXIBLE MISC
5045.0000 mg | Freq: Once | Status: AC
  Administered 2022-11-17: 5045 mg via INTRAVENOUS
  Filled 2022-11-17: qty 5045

## 2022-11-17 MED ORDER — ALPHA1-PROTEINASE INHIBITOR 1000 MG/20ML IV SOLN: 5000.0000 mg | Freq: Once | INTRAVENOUS | Status: DC

## 2022-11-24 ENCOUNTER — Ambulatory Visit
Admission: RE | Admit: 2022-11-24 | Discharge: 2022-11-24 | Disposition: A | Discharge status: Home / Self Care | Payer: Medicare Other | Source: Ambulatory Visit | Attending: Student in an Organized Health Care Education/Training Program | Admitting: Student in an Organized Health Care Education/Training Program

## 2022-11-24 DIAGNOSIS — E8801 Alpha-1-antitrypsin deficiency: Secondary | ICD-10-CM | POA: Diagnosis present

## 2022-11-24 DIAGNOSIS — J439 Emphysema, unspecified: Secondary | ICD-10-CM | POA: Insufficient documentation

## 2022-11-24 MED ORDER — EMPTY CONTAINERS FLEXIBLE MISC
5045.0000 mg | Freq: Once | Status: AC
  Administered 2022-11-24: 5045 mg via INTRAVENOUS
  Filled 2022-11-24: qty 5045

## 2022-11-24 MED ORDER — EMPTY CONTAINERS FLEXIBLE MISC
5045.0000 mg | Freq: Once | Status: DC
  Filled 2022-11-24: qty 5045

## 2022-12-01 ENCOUNTER — Ambulatory Visit
Admission: RE | Admit: 2022-12-01 | Discharge: 2022-12-01 | Disposition: A | Discharge status: Home / Self Care | Payer: Medicare Other | Source: Ambulatory Visit | Attending: Student in an Organized Health Care Education/Training Program | Admitting: Student in an Organized Health Care Education/Training Program

## 2022-12-01 DIAGNOSIS — E8801 Alpha-1-antitrypsin deficiency: Secondary | ICD-10-CM | POA: Insufficient documentation

## 2022-12-01 DIAGNOSIS — J439 Emphysema, unspecified: Secondary | ICD-10-CM | POA: Diagnosis present

## 2022-12-01 MED ORDER — EMPTY CONTAINERS FLEXIBLE MISC
5045.0000 mg | Freq: Once | Status: AC
  Administered 2022-12-01: 5045 mg via INTRAVENOUS
  Filled 2022-12-01: qty 5045

## 2022-12-01 MED ORDER — ALPHA1-PROTEINASE INHIBITOR 1000 MG/20ML IV SOLN: 5000.0000 mg | Freq: Once | INTRAVENOUS | Status: DC

## 2022-12-08 ENCOUNTER — Ambulatory Visit: Payer: Medicare Other

## 2022-12-08 ENCOUNTER — Ambulatory Visit
Admission: RE | Admit: 2022-12-08 | Discharge: 2022-12-08 | Disposition: A | Discharge status: Home / Self Care | Payer: Medicare Other | Source: Ambulatory Visit | Attending: Student in an Organized Health Care Education/Training Program | Admitting: Student in an Organized Health Care Education/Training Program

## 2022-12-08 DIAGNOSIS — J439 Emphysema, unspecified: Secondary | ICD-10-CM | POA: Insufficient documentation

## 2022-12-08 DIAGNOSIS — E8801 Alpha-1-antitrypsin deficiency: Secondary | ICD-10-CM | POA: Diagnosis present

## 2022-12-08 MED ORDER — ALPHA1-PROTEINASE INHIBITOR 1000 MG/20ML IV SOLN
5045.0000 mg | Freq: Once | INTRAVENOUS | Status: AC
  Administered 2022-12-08: 5045 mg via INTRAVENOUS
  Filled 2022-12-08 (×2): qty 5045

## 2022-12-15 ENCOUNTER — Ambulatory Visit
Admission: RE | Admit: 2022-12-15 | Discharge: 2022-12-15 | Disposition: A | Discharge status: Home / Self Care | Payer: Medicare Other | Source: Ambulatory Visit | Attending: Student in an Organized Health Care Education/Training Program | Admitting: Student in an Organized Health Care Education/Training Program

## 2022-12-15 DIAGNOSIS — E8801 Alpha-1-antitrypsin deficiency: Secondary | ICD-10-CM | POA: Diagnosis present

## 2022-12-15 MED ORDER — ALPHA1-PROTEINASE INHIBITOR 1000 MG/20ML IV SOLN
5050.0000 mg | INTRAVENOUS | Status: DC
  Administered 2022-12-15: 5050 mg via INTRAVENOUS
  Filled 2022-12-15 (×2): qty 5050

## 2022-12-15 NOTE — Progress Notes (Signed)
Infusion completed. VSS. Patient ambulated independently for discharge. Return appointment scheduled for 5/28 per patient request.

## 2022-12-23 ENCOUNTER — Ambulatory Visit
Admission: RE | Admit: 2022-12-23 | Discharge: 2022-12-23 | Disposition: A | Discharge status: Home / Self Care | Payer: Medicare Other | Source: Ambulatory Visit | Attending: Student in an Organized Health Care Education/Training Program | Admitting: Student in an Organized Health Care Education/Training Program

## 2022-12-23 DIAGNOSIS — E8801 Alpha-1-antitrypsin deficiency: Secondary | ICD-10-CM | POA: Insufficient documentation

## 2022-12-23 MED ORDER — ALPHA1-PROTEINASE INHIBITOR 1000 MG/20ML IV SOLN
5050.0000 mg | INTRAVENOUS | Status: DC
  Filled 2022-12-23: qty 6000

## 2022-12-23 MED ORDER — EMPTY CONTAINERS FLEXIBLE MISC
5050.0000 mg | Status: DC
  Administered 2022-12-23: 5050 mg via INTRAVENOUS
  Filled 2022-12-23: qty 5050

## 2022-12-29 ENCOUNTER — Ambulatory Visit
Admission: RE | Admit: 2022-12-29 | Discharge: 2022-12-29 | Disposition: A | Discharge status: Home / Self Care | Payer: Medicare Other | Source: Ambulatory Visit | Attending: Student in an Organized Health Care Education/Training Program | Admitting: Student in an Organized Health Care Education/Training Program

## 2022-12-29 DIAGNOSIS — J439 Emphysema, unspecified: Secondary | ICD-10-CM | POA: Insufficient documentation

## 2022-12-29 DIAGNOSIS — E8801 Alpha-1-antitrypsin deficiency: Secondary | ICD-10-CM | POA: Diagnosis present

## 2022-12-29 MED ORDER — EMPTY CONTAINERS FLEXIBLE MISC
5050.0000 mg | Freq: Once | Status: AC
  Administered 2022-12-29: 5050 mg via INTRAVENOUS
  Filled 2022-12-29: qty 5050

## 2023-01-05 ENCOUNTER — Ambulatory Visit
Admission: RE | Admit: 2023-01-05 | Discharge: 2023-01-05 | Disposition: A | Discharge status: Home / Self Care | Payer: Medicare Other | Source: Ambulatory Visit | Attending: Student in an Organized Health Care Education/Training Program | Admitting: Student in an Organized Health Care Education/Training Program

## 2023-01-05 DIAGNOSIS — E8801 Alpha-1-antitrypsin deficiency: Secondary | ICD-10-CM | POA: Insufficient documentation

## 2023-01-05 MED ORDER — ALPHA1-PROTEINASE INHIBITOR 1000 MG/20ML IV SOLN
5050.0000 mg | INTRAVENOUS | Status: DC
  Administered 2023-01-05: 5050 mg via INTRAVENOUS
  Filled 2023-01-05 (×2): qty 5050

## 2023-01-12 ENCOUNTER — Ambulatory Visit
Admission: RE | Admit: 2023-01-12 | Discharge: 2023-01-12 | Disposition: A | Discharge status: Home / Self Care | Payer: Medicare Other | Source: Ambulatory Visit | Attending: Internal Medicine | Admitting: Internal Medicine

## 2023-01-12 DIAGNOSIS — E8801 Alpha-1-antitrypsin deficiency: Secondary | ICD-10-CM | POA: Insufficient documentation

## 2023-01-12 MED ORDER — ALPHA1-PROTEINASE INHIBITOR 1000 MG/20ML IV SOLN
5400.0000 mg | INTRAVENOUS | Status: DC
  Administered 2023-01-12: 5400 mg via INTRAVENOUS
  Filled 2023-01-12 (×2): qty 5400

## 2023-01-19 ENCOUNTER — Ambulatory Visit
Admission: RE | Admit: 2023-01-19 | Discharge: 2023-01-19 | Disposition: A | Discharge status: Home / Self Care | Payer: Medicare Other | Source: Ambulatory Visit | Attending: Student in an Organized Health Care Education/Training Program | Admitting: Student in an Organized Health Care Education/Training Program

## 2023-01-19 ENCOUNTER — Ambulatory Visit (LOCAL_COMMUNITY_HEALTH_CENTER): Payer: Medicare Other

## 2023-01-19 ENCOUNTER — Ambulatory Visit: Payer: Medicare Other

## 2023-01-19 DIAGNOSIS — E8801 Alpha-1-antitrypsin deficiency: Secondary | ICD-10-CM | POA: Insufficient documentation

## 2023-01-19 DIAGNOSIS — Z23 Encounter for immunization: Secondary | ICD-10-CM

## 2023-01-19 DIAGNOSIS — Z719 Counseling, unspecified: Secondary | ICD-10-CM

## 2023-01-19 MED ORDER — ALPHA1-PROTEINASE INHIBITOR 1000 MG/20ML IV SOLN
5400.0000 mg | INTRAVENOUS | Status: DC
  Administered 2023-01-19: 5400 mg via INTRAVENOUS
  Filled 2023-01-19 (×2): qty 5400

## 2023-01-19 NOTE — Progress Notes (Signed)
Client seen in nurse clinic for final twinrix vaccine.  Vaccine administered in left arm tolerated well.  Client with no questions.  Jaelan Rasheed Sherrilyn Rist, RN

## 2023-01-26 ENCOUNTER — Ambulatory Visit
Admission: RE | Admit: 2023-01-26 | Discharge: 2023-01-26 | Disposition: A | Discharge status: Home / Self Care | Payer: Medicare Other | Source: Ambulatory Visit | Attending: Student in an Organized Health Care Education/Training Program | Admitting: Student in an Organized Health Care Education/Training Program

## 2023-01-26 DIAGNOSIS — E8801 Alpha-1-antitrypsin deficiency: Secondary | ICD-10-CM | POA: Diagnosis present

## 2023-01-26 MED ORDER — ALPHA1-PROTEINASE INHIBITOR 1000 MG/20ML IV SOLN
5400.0000 mg | INTRAVENOUS | Status: DC
  Administered 2023-01-26: 5400 mg via INTRAVENOUS
  Filled 2023-01-26 (×2): qty 5400

## 2023-01-28 ENCOUNTER — Other Ambulatory Visit: Payer: Self-pay | Admitting: Gastroenterology

## 2023-01-28 DIAGNOSIS — K7460 Unspecified cirrhosis of liver: Secondary | ICD-10-CM

## 2023-02-02 ENCOUNTER — Ambulatory Visit
Admission: RE | Admit: 2023-02-02 | Discharge: 2023-02-02 | Disposition: A | Discharge status: Home / Self Care | Payer: Medicare Other | Source: Ambulatory Visit | Attending: Internal Medicine | Admitting: Internal Medicine

## 2023-02-02 ENCOUNTER — Ambulatory Visit (INDEPENDENT_AMBULATORY_CARE_PROVIDER_SITE_OTHER): Payer: Medicare Other | Admitting: Internal Medicine

## 2023-02-02 VITALS — BP 116/70 | HR 71 | Resp 16 | Ht 76.0 in | Wt 209.0 lb

## 2023-02-02 DIAGNOSIS — E8801 Alpha-1-antitrypsin deficiency: Secondary | ICD-10-CM | POA: Insufficient documentation

## 2023-02-02 DIAGNOSIS — I482 Chronic atrial fibrillation, unspecified: Secondary | ICD-10-CM | POA: Diagnosis not present

## 2023-02-02 DIAGNOSIS — G4731 Primary central sleep apnea: Secondary | ICD-10-CM | POA: Diagnosis not present

## 2023-02-02 DIAGNOSIS — I10 Essential (primary) hypertension: Secondary | ICD-10-CM

## 2023-02-02 DIAGNOSIS — H903 Sensorineural hearing loss, bilateral: Secondary | ICD-10-CM | POA: Insufficient documentation

## 2023-02-02 MED ORDER — ALPHA1-PROTEINASE INHIBITOR 1000 MG/20ML IV SOLN
5030.0000 mg | INTRAVENOUS | Status: DC
  Administered 2023-02-02: 5030 mg via INTRAVENOUS
  Filled 2023-02-02 (×2): qty 5030

## 2023-02-02 NOTE — Patient Instructions (Signed)

## 2023-02-02 NOTE — Progress Notes (Signed)
[facility] [address] [place], [place] [address]  Pulmonary Sleep Medicine   Office Visit Note  Patient Name: Billy Freeman. DOB: 03/23/1951 MRN [id number]    Chief Complaint: Obstructive Sleep Apnea visit  Brief History:  Ransom is seen today for an annual follow up on ASV.  The patient has a 18 year history of sleep apnea. Patient is using PAP nightly.  The patient feels rested after sleeping with PAP.  The patient reports benefiting from PAP use. Reported sleepiness is  improved and the Epworth Sleepiness Score is 6 out of 24. The patient will occasionally take naps. The patient complains of the following: none.  The compliance download shows 99% compliance with an average use time of 8:37 hours. The AHI is 1.5.  The patient does not complain  of limb movements disrupting sleep.  ROS  General: (-) fever, (-) chills, (-) night sweat Nose and Sinuses: (-) nasal stuffiness or itchiness, (-) postnasal drip, (-) nosebleeds, (-) sinus trouble. Mouth and Throat: (-) sore throat, (-) hoarseness. Neck: (-) swollen glands, (-) enlarged thyroid, (-) neck pain. Respiratory: - cough, - shortness of breath, - wheezing. Neurologic: - numbness, - tingling. Psychiatric: - anxiety, - depression   Current Medication: Outpatient Encounter Medications as of 02/02/2023  Medication Sig Note   albuterol (PROVENTIL) (2.5 MG/3ML) 0.083% nebulizer solution Inhale into the lungs.    pantoprazole (PROTONIX) 20 MG tablet SMARTSIG:1 Tablet(s) By Mouth Every 12 Hours    Acetaminophen Extra Strength 500 MG TABS Take 2 tablets by mouth every 8 (eight) hours as needed.    albuterol (VENTOLIN HFA) 108 (90 Base) MCG/ACT inhaler Inhale into the lungs.    allopurinol (ZYLOPRIM) 100 MG tablet Take 1 tablet by mouth daily.    apixaban (ELIQUIS) 5 MG TABS tablet Take 1 tablet (5 mg total) by mouth 2 (two) times daily. Take 10 mg (two pills) twice daily for seven days, then take 5mg  (one pill) twice  daily. (Patient taking differently: Take 5 mg by mouth 2 (two) times daily.) 04/21/2022: Will be temp. Stopping for an weds. Oct. 6 for cath    azithromycin (ZITHROMAX) 250 MG tablet Take 250 mg by mouth daily.    Cholecalciferol (VITAMIN D) 50 MCG (2000 UT) tablet Take 4,000 Units by mouth daily.    citalopram (CELEXA) 20 MG tablet Take 20 mg by mouth every morning.     clindamycin (CLEOCIN T) 1 % external solution Apply 1 application topically 2 (two) times daily as needed (for breakout).     dofetilide (TIKOSYN) 500 MCG capsule Take 500 mcg by mouth 2 (two) times daily.    Omega-3 Fatty Acids (FISH OIL) 1200 MG CAPS Take 1,200 mg by mouth 2 (two) times daily.     propranolol (INDERAL) 10 MG tablet Take 1 tablet by mouth 2 (two) times daily.    temazepam (RESTORIL) 15 MG capsule Take 15 mg by mouth at bedtime.     Tiotropium Bromide-Olodaterol (STIOLTO RESPIMAT) 2.5-2.5 MCG/ACT AERS Inhale 2 each into the lungs every morning.    vitamin B-12 (CYANOCOBALAMIN) 500 MCG tablet Take 500 mcg by mouth daily.    [DISCONTINUED] budesonide-formoterol (SYMBICORT) 160-4.5 MCG/ACT inhaler Inhale 2 puffs into the lungs 2 (two) times daily. (Patient not taking: Reported on 09/29/2022)    [DISCONTINUED] polyethylene glycol (MIRALAX / GLYCOLAX) 17 g packet Take 17 g by mouth daily.    Facility-Administered Encounter Medications as of 02/02/2023  Medication   alpha-1-proteinase inhibitor (human) (PROLASTIN) injection 5,030 mg  Surgical History: Past Surgical History:  Procedure Laterality Date   ANTERIOR CRUCIATE LIGAMENT (ACL) REVISION     broken nose surgery     BRONCHOSCOPY     BUNIONECTOMY Bilateral    CATARACT EXTRACTION W/PHACO Right 02/09/2018   Procedure: CATARACT EXTRACTION PHACO AND INTRAOCULAR LENS PLACEMENT (IOC);  Surgeon: Galen Manila, MD;  Location: [facility] ORS;  Service: Ophthalmology;  Laterality: Right;  [place] 00:45 AP% 14.4 CDE 6.58 Fluid pack lot # [id number]   CATARACT EXTRACTION  W/PHACO Left 12/14/2018   Procedure: CATARACT EXTRACTION PHACO AND INTRAOCULAR LENS PLACEMENT (IOC LEFT;  Surgeon: Galen Manila, MD;  Location: [facility];  Service: Ophthalmology;  Laterality: Left;   CHOLECYSTECTOMY     COLONOSCOPY N/A 09/16/2021   Procedure: COLONOSCOPY;  Surgeon: Jaynie Collins, DO;  Location: [facility] ENDOSCOPY;  Service: Gastroenterology;  Laterality: N/A;   ESOPHAGOGASTRODUODENOSCOPY N/A 09/16/2021   Procedure: ESOPHAGOGASTRODUODENOSCOPY (EGD);  Surgeon: Jaynie Collins, DO;  Location: [facility] ENDOSCOPY;  Service: Gastroenterology;  Laterality: N/A;   EYE SURGERY     FLEXIBLE BRONCHOSCOPY N/A 04/30/2017   Procedure: FLEXIBLE BRONCHOSCOPY;  Surgeon: Shane Crutch, MD;  Location: [facility] ORS;  Service: Pulmonary;  Laterality: N/A;   KIDNEY STONE SURGERY     LIVER BIOPSY     MENISCUS REPAIR Bilateral    right knee x 3, left knee x 2   RIGHT HEART CATH N/A 12/31/2021   Procedure: RIGHT HEART CATH;  Surgeon: Marcina Millard, MD;  Location: [facility] INVASIVE CV LAB;  Service: Cardiovascular;  Laterality: N/A;   SHOULDER ARTHROSCOPY WITH ROTATOR CUFF REPAIR AND SUBACROMIAL DECOMPRESSION Right 02/01/2019   Procedure: Extensive arthroscopic debridement, arthroscopic subacromial decompression, mini-open rotator cuff repair supplemented by a [employer] Regeneten patch, and mini-open biceps tenodesis, right shoulder.;  Surgeon: Christena Flake, MD;  Location: [facility] ORS;  Service: Orthopedics;  Laterality: Right;   TOE AMPUTATION Bilateral    index toes    Medical History: Past Medical History:  Diagnosis Date   Atrial fibrillation (HCC)    Basal cell carcinoma    Cancer (HCC)    SKIN   Cirrhosis (HCC)    POSSIBLE   COPD (chronic obstructive pulmonary disease) (HCC)    Depression    Diverticulosis    Dysrhythmia    a-fib   Edema    right FEET/LEG   Fatty liver    GERD (gastroesophageal reflux disease)    h/o   Glaucoma (increased eye  pressure)    Gout    Heart murmur    History of kidney stones    HOH (hard of hearing)    Hypertension    Pneumonia    January/February 2020   Pulmonary embolism (HCC)    Pulmonary nodules    Reflux    Rosacea    Sleep apnea    cpap   Tibial fracture 12/21/2021   Tremors of nervous system    Wears hearing aid     Family History: Non contributory to the present illness  Social History: Social History   Socioeconomic History   Marital status: Married    Spouse name: Not on file   Number of children: Not on file   Years of education: Not on file   Highest education level: Not on file  Occupational History   Not on file  Tobacco Use   Smoking status: Never   Smokeless tobacco: Never  Vaping Use   Vaping Use: Never used  Substance and Sexual Activity   Alcohol use: Not  Currently    Alcohol/week: 5.0 standard drinks of alcohol    Types: 5 Glasses of wine per week   Drug use: Not Currently    Types: Marijuana    Comment: Friday 09/13/20 Edibles   Sexual activity: Yes  Other Topics Concern   Not on file  Social History Narrative   Not on file   Social Determinants of Health   Financial Resource Strain: Not on file  Food Insecurity: No Food Insecurity (04/22/2022)   Hunger Vital Sign    Worried About Running Out of Food in the Last Year: Never true    Ran Out of Food in the Last Year: Never true  Transportation Needs: No Transportation Needs (04/22/2022)   PRAPARE - Administrator, Civil Service (Medical): No    Lack of Transportation (Non-Medical): No  Physical Activity: Not on file  Stress: Not on file  Social Connections: Not on file  Intimate Partner Violence: Not At Risk (04/22/2022)   Humiliation, Afraid, Rape, and Kick questionnaire    Fear of Current or Ex-Partner: No    Emotionally Abused: No    Physically Abused: No    Sexually Abused: No    Vital Signs: Blood pressure 116/70, pulse 71, resp. rate 16, height 6\' 4"  (1.93 m), weight 209  lb (94.8 kg), SpO2 97 %. Body mass index is 25.44 kg/m.    Examination: General Appearance: The patient is well-developed, well-nourished, and in no distress. Neck Circumference: 42 cm Skin: Gross inspection of skin unremarkable. Head: normocephalic, no gross deformities. Eyes: no gross deformities noted. ENT: ears appear grossly normal Neurologic: Alert and oriented. No involuntary movements.  STOP BANG RISK ASSESSMENT S (snore) Have you been told that you snore?     NO   T (tired) Are you often tired, fatigued, or sleepy during the day?   NO  O (obstruction) Do you stop breathing, choke, or gasp during sleep? NO   P (pressure) Do you have or are you being treated for high blood pressure? NO   B (BMI) Is your body index greater than 35 kg/m? NO   A (age) Are you 72 years old or older? YES   N (neck) Do you have a neck circumference greater than 16 inches?   YES   G (gender) Are you a male? YES   TOTAL STOP/BANG "YES" ANSWERS 3       A STOP-Bang score of 2 or less is considered low risk, and a score of 5 or more is high risk for having either moderate or severe OSA. For people who score 3 or 4, doctors may need to perform further assessment to determine how likely they are to have OSA.         EPWORTH SLEEPINESS SCALE:  Scale:  (0)= no chance of dozing; (1)= slight chance of dozing; (2)= moderate chance of dozing; (3)= high chance of dozing  Chance  Situtation    Sitting and reading: 3    Watching TV: 1    Sitting Inactive in public: 0    As a passenger in car: 0      Lying down to rest: 2    Sitting and talking: 0    Sitting quielty after lunch: 0    In a car, stopped in traffic: 0   TOTAL SCORE:   6 out of 24    SLEEP STUDIES:  PSG (01/22/05)  AHI 17, supine AHI 22, min SPO2 84% TITRATION (06/16/18)  needs ASV titration  TITRATION (07/08/18)  recommended BIPAP ASV at min EPAP 5, max EPAP 15, min PS 3   CPAP COMPLIANCE DATA:  Date Range:  01/28/22 - 01/27/23  Average Daily Use: 8:37 hours  Median Use: 8:48 hours  Compliance for > 4 Hours: 360 days  AHI: 1.5 respiratory events per hour  Days Used: 362/365  Mask Leak: 26.2  95th Percentile Pressure: ASV         LABS: No results found for this or any previous visit (from the past 2160 hour(s)).  Radiology: No results found.  No results found.  No results found.    Assessment and Plan: Patient Active Problem List   Diagnosis Date Noted   Sensorineural hearing loss, bilateral 02/02/2023   S/P ablation of atrial fibrillation 09/01/2022   NSVT (nonsustained ventricular tachycardia) (HCC) 05/26/2022   Alpha-1-antitrypsin deficiency (HCC) 05/19/2022   HFrEF (heart failure with reduced ejection fraction) (HCC) 05/19/2022   Lower GI bleed 04/22/2022   Hematochezia 04/21/2022   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   Pulmonary hypertension, mild (HCC) 01/09/2022   OSA treated with BiPAP 12/31/2020   Hypertension 12/31/2020   Overweight (BMI 25.0-29.9) 12/31/2020   Aortic atherosclerosis (HCC) 05/03/2020   Senile osteoporosis 03/25/2019   Actinic keratosis 01/03/2019   Decreased left ventricular function 01/03/2019   Senile hyperkeratosis 01/03/2019   Atrial fibrillation (HCC) 10/05/2018   Longstanding persistent atrial fibrillation (HCC) 10/05/2018   Bilateral lower extremity edema 01/19/2018   Blue toes 01/19/2018   History of pulmonary embolism 07/14/2017   Personal history of gout 07/14/2017   Closed compression fracture of L1 vertebra (HCC) 07/15/2016   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   History of urinary stone 02/26/2015   Erectile dysfunction 06/07/2014   Hyperlipidemia 06/06/2014   COPD (chronic obstructive pulmonary disease) (HCC) 01/02/2014   Clinical depression 01/02/2014   Acid reflux 01/02/2014   Gout 01/02/2014   BP (high blood pressure) 01/02/2014   Intermittent tremor 01/02/2014   Obstructive apnea 01/02/2014   Acne erythematosa  01/02/2014   Abnormal involuntary movement 01/02/2014   Major depressive disorder, single episode 01/02/2014   Calculus of kidney 11/23/2013   Hydronephrosis 11/23/2013   Renal colic 11/23/2013   Calculi, ureter 11/23/2013   1. Complex sleep apnea syndrome The patient does tolerate PAP and reports  benefit from PAP use. The patient was reminded how to clean equipment and advised to replace supplies routinely. The patient was also counselled on weight loss . The compliance is excellent. The AHI is 1.5.   Complex sleep apnea syndrome- controlled with bipap ASV. He is doing well. Continue with excellent compliance. F/u one year.    2. Chronic atrial fibrillation (HCC) Controlled with dofetilide, he is on eliquis. Continues f/u with cardiology. S/p ablation this past year.   3. Hypertension, unspecified type Hypertension Counseling:   The following hypertensive lifestyle modification were recommended and discussed:  1. Limiting alcohol intake to less than 1 oz/day of ethanol:(24 oz of beer or 8 oz of wine or 2 oz of 100-proof whiskey). 2. Take baby ASA 81 mg daily. 3. Importance of regular aerobic exercise and losing weight. 4. Reduce dietary saturated fat and cholesterol intake for overall cardiovascular health. 5. Maintaining adequate dietary potassium, calcium, and magnesium intake. 6. Regular monitoring of the blood pressure. 7. Reduce sodium intake to less than 100 mmol/day (less than 2.3 gm of sodium or less than 6 gm of sodium choride)      General Counseling: I have discussed the findings of the evaluation and  examination with Mathis Fare.  I have also discussed any further diagnostic evaluation thatmay be needed or ordered today. Anees verbalizes understanding of the findings of todays visit. We also reviewed his medications today and discussed drug interactions and side effects including but not limited excessive drowsiness and altered mental states. We also discussed that there is  always a risk not just to him but also people around him. he has been encouraged to call the office with any questions or concerns that should arise related to todays visit.  No orders of the defined types were placed in this encounter.       I have personally obtained a history, examined the patient, evaluated laboratory and imaging results, formulated the assessment and plan and placed orders. This patient was seen today by Emmaline Kluver, PA-C in collaboration with Dr. Freda Munro.   Yevonne Pax, MD [facility] Diplomate ABMS Pulmonary Critical Care Medicine and Sleep Medicine

## 2023-02-07 ENCOUNTER — Ambulatory Visit
Admission: RE | Admit: 2023-02-07 | Discharge: 2023-02-07 | Disposition: A | Discharge status: Home / Self Care | Payer: Medicare Other | Source: Ambulatory Visit | Attending: Gastroenterology | Admitting: Gastroenterology

## 2023-02-07 DIAGNOSIS — K7460 Unspecified cirrhosis of liver: Secondary | ICD-10-CM | POA: Insufficient documentation

## 2023-02-07 MED ORDER — GADOBUTROL 1 MMOL/ML IV SOLN
9.0000 mL | Freq: Once | INTRAVENOUS | Status: AC | PRN
  Administered 2023-02-07: 9 mL via INTRAVENOUS

## 2023-02-09 ENCOUNTER — Ambulatory Visit
Admission: RE | Admit: 2023-02-09 | Discharge: 2023-02-09 | Disposition: A | Discharge status: Home / Self Care | Payer: Medicare Other | Source: Ambulatory Visit | Attending: Student in an Organized Health Care Education/Training Program | Admitting: Student in an Organized Health Care Education/Training Program

## 2023-02-09 DIAGNOSIS — E8801 Alpha-1-antitrypsin deficiency: Secondary | ICD-10-CM | POA: Insufficient documentation

## 2023-02-09 MED ORDER — ALPHA1-PROTEINASE INHIBITOR 1000 MG/20ML IV SOLN
5030.0000 mg | INTRAVENOUS | Status: DC
  Administered 2023-02-09: 5030 mg via INTRAVENOUS
  Filled 2023-02-09 (×2): qty 5030

## 2023-02-16 ENCOUNTER — Ambulatory Visit
Admission: RE | Admit: 2023-02-16 | Discharge: 2023-02-16 | Disposition: A | Discharge status: Home / Self Care | Payer: Medicare Other | Source: Ambulatory Visit | Attending: Student in an Organized Health Care Education/Training Program | Admitting: Student in an Organized Health Care Education/Training Program

## 2023-02-16 DIAGNOSIS — E8801 Alpha-1-antitrypsin deficiency: Secondary | ICD-10-CM | POA: Insufficient documentation

## 2023-02-16 MED ORDER — ALPHA1-PROTEINASE INHIBITOR 1000 MG/20ML IV SOLN
5030.0000 mg | INTRAVENOUS | Status: DC
  Administered 2023-02-16: 5030 mg via INTRAVENOUS
  Filled 2023-02-16: qty 5030

## 2023-02-23 ENCOUNTER — Ambulatory Visit
Admission: RE | Admit: 2023-02-23 | Discharge: 2023-02-23 | Disposition: A | Discharge status: Home / Self Care | Payer: Medicare Other | Source: Ambulatory Visit | Attending: Student in an Organized Health Care Education/Training Program | Admitting: Student in an Organized Health Care Education/Training Program

## 2023-02-23 DIAGNOSIS — Z79899 Other long term (current) drug therapy: Secondary | ICD-10-CM | POA: Insufficient documentation

## 2023-02-23 DIAGNOSIS — E8801 Alpha-1-antitrypsin deficiency: Secondary | ICD-10-CM | POA: Insufficient documentation

## 2023-02-23 MED ORDER — ALPHA1-PROTEINASE INHIBITOR 1000 MG/20ML IV SOLN
4990.0000 mg | Freq: Once | INTRAVENOUS | Status: AC
  Administered 2023-02-23: 4990 mg via INTRAVENOUS
  Filled 2023-02-23: qty 4990

## 2023-03-02 ENCOUNTER — Ambulatory Visit
Admission: RE | Admit: 2023-03-02 | Discharge: 2023-03-02 | Disposition: A | Discharge status: Home / Self Care | Payer: Medicare Other | Source: Ambulatory Visit | Attending: Internal Medicine | Admitting: Internal Medicine

## 2023-03-02 DIAGNOSIS — E8801 Alpha-1-antitrypsin deficiency: Secondary | ICD-10-CM | POA: Insufficient documentation

## 2023-03-02 MED ORDER — ALPHA1-PROTEINASE INHIBITOR 1000 MG/20ML IV SOLN
4990.0000 mg | Freq: Once | INTRAVENOUS | Status: AC
  Administered 2023-03-02: 4990 mg via INTRAVENOUS
  Filled 2023-03-02: qty 5000
  Filled 2023-03-02: qty 4990

## 2023-03-09 ENCOUNTER — Ambulatory Visit
Admission: RE | Admit: 2023-03-09 | Discharge: 2023-03-09 | Disposition: A | Discharge status: Home / Self Care | Payer: Medicare Other | Source: Ambulatory Visit | Attending: Student in an Organized Health Care Education/Training Program | Admitting: Student in an Organized Health Care Education/Training Program

## 2023-03-09 DIAGNOSIS — Z7969 Long term (current) use of other immunomodulators and immunosuppressants: Secondary | ICD-10-CM | POA: Insufficient documentation

## 2023-03-09 DIAGNOSIS — E8801 Alpha-1-antitrypsin deficiency: Secondary | ICD-10-CM | POA: Insufficient documentation

## 2023-03-09 MED ORDER — ALPHA1-PROTEINASE INHIBITOR 1000 MG/20ML IV SOLN
4990.0000 mg | INTRAVENOUS | Status: DC
  Administered 2023-03-09: 4990 mg via INTRAVENOUS
  Filled 2023-03-09: qty 4990

## 2023-03-16 ENCOUNTER — Ambulatory Visit
Admission: RE | Admit: 2023-03-16 | Discharge: 2023-03-16 | Disposition: A | Discharge status: Home / Self Care | Payer: Medicare Other | Source: Ambulatory Visit | Attending: Student in an Organized Health Care Education/Training Program | Admitting: Student in an Organized Health Care Education/Training Program

## 2023-03-16 DIAGNOSIS — E8801 Alpha-1-antitrypsin deficiency: Secondary | ICD-10-CM | POA: Insufficient documentation

## 2023-03-16 MED ORDER — ALPHA1-PROTEINASE INHIBITOR 1000 MG/20ML IV SOLN
5135.0000 mg | INTRAVENOUS | Status: DC
  Administered 2023-03-16: 5135 mg via INTRAVENOUS
  Filled 2023-03-16: qty 5135

## 2023-04-02 ENCOUNTER — Other Ambulatory Visit: Payer: Self-pay

## 2023-04-02 ENCOUNTER — Encounter: Payer: Self-pay | Admitting: Gastroenterology

## 2023-04-06 ENCOUNTER — Ambulatory Visit
Admission: RE | Admit: 2023-04-06 | Discharge: 2023-04-06 | Disposition: A | Discharge status: Home / Self Care | Payer: Medicare Other | Source: Ambulatory Visit | Attending: Student in an Organized Health Care Education/Training Program | Admitting: Student in an Organized Health Care Education/Training Program

## 2023-04-06 DIAGNOSIS — E8801 Alpha-1-antitrypsin deficiency: Secondary | ICD-10-CM | POA: Insufficient documentation

## 2023-04-06 MED ORDER — ALPHA1-PROTEINASE INHIBITOR 1000 MG/20ML IV SOLN
5135.0000 mg | Freq: Once | INTRAVENOUS | Status: AC
  Administered 2023-04-06: 5135 mg via INTRAVENOUS
  Filled 2023-04-06: qty 5135

## 2023-04-08 NOTE — H&P (Signed)
Pre-Procedure H&P   Patient ID: Billy Freeman. is a 72 y.o. male.  Gastroenterology Provider: Jaynie Collins, DO  Referring Provider: Jacob Moores, PA PCP: Barbette Reichmann, MD  Date: 04/09/2023  HPI [name]. is a 72 y.o. male who presents today for Colonoscopy for Surveillance-personal history of colon polyps .  Patient underwent colonoscopy in February 2023 with inadequate prep.  7 adenomatous polyps removed at that time.  Since then he had a diverticular bleed in October of that year.  He is undergoing repeat colonoscopy given previous prep and numerous polyps at that time.  Mother with a history of colon polyps  INR 1.2 creatinine 1.0 hemoglobin 16 MCV 97.6 platelets 257,000  Currently on Eliquis was been held for the procedure last dose Monday evening  Patient with HFrEF, compensated cirrhosis, pulmonary hypertension, COPD, struct of sleep apnea, alpha-1 antitrypsin deficiency, history of PE   Past Medical History:  Diagnosis Date   Atrial fibrillation (HCC)    Basal cell carcinoma    Cancer (HCC)    SKIN   Cirrhosis (HCC)    POSSIBLE   COPD (chronic obstructive pulmonary disease) (HCC)    Depression    Diverticulosis    Dysrhythmia    a-fib   Edema    right FEET/LEG   Fatty liver    GERD (gastroesophageal reflux disease)    h/o   Glaucoma (increased eye pressure)    Gout    Heart murmur    History of alpha-1-antitrypsin deficiency    History of kidney stones    HOH (hard of hearing)    Hypertension    Pneumonia    January/February 2020   Pulmonary embolism (HCC)    Pulmonary nodules    Reflux    Rosacea    Sleep apnea    cpap   Tibial fracture 12/21/2021   Tremors of nervous system    Wears hearing aid     Past Surgical History:  Procedure Laterality Date   ANTERIOR CRUCIATE LIGAMENT (ACL) REVISION     broken nose surgery     BRONCHOSCOPY     BUNIONECTOMY Bilateral    CARDIAC ELECTROPHYSIOLOGY MAPPING AND ABLATION      CATARACT EXTRACTION W/PHACO Right 02/09/2018   Procedure: CATARACT EXTRACTION PHACO AND INTRAOCULAR LENS PLACEMENT (IOC);  Surgeon: Galen Manila, MD;  Location: [facility] ORS;  Service: Ophthalmology;  Laterality: Right;  [place] 00:45 AP% 14.4 CDE 6.58 Fluid pack lot # [id number]   CATARACT EXTRACTION W/PHACO Left 12/14/2018   Procedure: CATARACT EXTRACTION PHACO AND INTRAOCULAR LENS PLACEMENT (IOC LEFT;  Surgeon: Galen Manila, MD;  Location: [facility];  Service: Ophthalmology;  Laterality: Left;   CHOLECYSTECTOMY     COLONOSCOPY N/A 09/16/2021   Procedure: COLONOSCOPY;  Surgeon: Jaynie Collins, DO;  Location: [facility] END[facility]COPY;  Service: Gastroenterology;  Laterality: N/A;   COLONOSCOPY     2023   ESOPHAGOGASTRODUODENOSCOPY N/A 09/16/2021   Procedure: ESOPHAGOGASTRODUODENOSCOPY (EGD);  Surgeon: Jaynie Collins, DO;  Location: [facility] END[facility]COPY;  Service: Gastroenterology;  Laterality: N/A;   EYE SURGERY     FLEXIBLE BRONCHOSCOPY N/A 04/30/2017   Procedure: FLEXIBLE BRONCHOSCOPY;  Surgeon: Shane Crutch, MD;  Location: [facility] ORS;  Service: Pulmonary;  Laterality: N/A;   KIDNEY STONE SURGERY     LIVER BIOPSY     MENISCUS REPAIR Bilateral    right knee x 3, left knee x 2   RIGHT HEART CATH N/A 12/31/2021   Procedure: RIGHT HEART CATH;  Surgeon: Darrold Junker,  Lyn Hollingshead, MD;  Location: [facility] INVASIVE CV LAB;  Service: Cardiovascular;  Laterality: N/A;   SHOULDER ARTHROSCOPY WITH ROTATOR CUFF REPAIR AND SUBACROMIAL DECOMPRESSION Right 02/01/2019   Procedure: Extensive arthroscopic debridement, arthroscopic subacromial decompression, mini-open rotator cuff repair supplemented by a [employer] Regeneten patch, and mini-open biceps tenodesis, right shoulder.;  Surgeon: Christena Flake, MD;  Location: [facility] ORS;  Service: Orthopedics;  Laterality: Right;   TOE AMPUTATION Bilateral    index toes    Family History Mother- colon polyps No h/o GI disease or malignancy  Review  of Systems  Constitutional:  Negative for activity change, appetite change, chills, diaphoresis, fatigue, fever and unexpected weight change.  HENT:  Negative for trouble swallowing and voice change.   Respiratory:  Negative for shortness of breath and wheezing.   Cardiovascular:  Negative for chest pain, palpitations and leg swelling.  Gastrointestinal:  Negative for abdominal distention, abdominal pain, anal bleeding, blood in stool, constipation, diarrhea, nausea and vomiting.  Musculoskeletal:  Negative for arthralgias and myalgias.  Skin:  Negative for color change and pallor.  Neurological:  Negative for dizziness, syncope and weakness.  Psychiatric/Behavioral:  Negative for confusion. The patient is not nervous/anxious.   All other systems reviewed and are negative.    Medications No current facility-administered medications on file prior to encounter.   Current Outpatient Medications on File Prior to Encounter  Medication Sig Dispense Refill   dofetilide (TIKOSYN) 500 MCG capsule Take 500 mcg by mouth 2 (two) times daily.     Acetaminophen Extra Strength 500 MG TABS Take 2 tablets by mouth every 8 (eight) hours as needed.     albuterol (PROVENTIL) (2.5 MG/3ML) 0.083% nebulizer solution Inhale into the lungs.     albuterol (VENTOLIN HFA) 108 (90 Base) MCG/ACT inhaler Inhale into the lungs.     allopurinol (ZYLOPRIM) 100 MG tablet Take 1 tablet by mouth daily.     apixaban (ELIQUIS) 5 MG TABS tablet Take 1 tablet (5 mg total) by mouth 2 (two) times daily. Take 10 mg (two pills) twice daily for seven days, then take 5mg  (one pill) twice daily. (Patient taking differently: Take 5 mg by mouth 2 (two) times daily.) 60 tablet 0   azithromycin (ZITHROMAX) 250 MG tablet Take 250 mg by mouth daily.     Cholecalciferol (VITAMIN D) 50 MCG (2000 UT) tablet Take 4,000 Units by mouth daily.     citalopram (CELEXA) 20 MG tablet Take 20 mg by mouth every morning.      clindamycin (CLEOCIN T) 1 %  external solution Apply 1 application topically 2 (two) times daily as needed (for breakout).      Omega-3 Fatty Acids (FISH OIL) 1200 MG CAPS Take 1,200 mg by mouth 2 (two) times daily.      pantoprazole (PROTONIX) 20 MG tablet SMARTSIG:1 Tablet(s) By Mouth Every 12 Hours     propranolol (INDERAL) 10 MG tablet Take 1 tablet by mouth 2 (two) times daily.     temazepam (RESTORIL) 15 MG capsule Take 15 mg by mouth at bedtime.      Tiotropium Bromide-Olodaterol (STIOLTO RESPIMAT) 2.5-2.5 MCG/ACT AERS Inhale 2 each into the lungs every morning.     vitamin B-12 (CYANOCOBALAMIN) 500 MCG tablet Take 500 mcg by mouth daily.      Pertinent medications related to GI and procedure were reviewed by me with the patient prior to the procedure   Current Facility-Administered Medications:    0.9 %  sodium chloride infusion, , Intravenous, Continuous,  Jaynie Collins, DO, Last Rate: 20 mL/hr at 04/09/23 1108, New Bag at 04/09/23 1108  sodium chloride 20 mL/hr at 04/09/23 1108       Allergies  Allergen Reactions   Oxycodone Itching  Allergies were reviewed by me prior to the procedure  Objective   Body mass index is 25.37 kg/m. Vitals:   04/09/23 1109  BP: (!) 143/93  Pulse: 74  Resp: 16  Temp: (!) 96.5 F (35.8 C)  TempSrc: Temporal  SpO2: 93%  Weight: 92.1 kg  Height: 6\' 3"  (1.905 m)     Physical Exam Vitals and nursing note reviewed.  Constitutional:      General: He is not in acute distress.    Appearance: Normal appearance. He is not ill-appearing, toxic-appearing or diaphoretic.  HENT:     Head: Normocephalic and atraumatic.     Nose: Nose normal.     Mouth/Throat:     Mouth: Mucous membranes are moist.     Pharynx: Oropharynx is clear.  Eyes:     General: No scleral icterus.    Extraocular Movements: Extraocular movements intact.  Cardiovascular:     Rate and Rhythm: Normal rate and regular rhythm.     Heart sounds: Normal heart sounds. No murmur heard.    No  friction rub. No gallop.  Pulmonary:     Effort: Pulmonary effort is normal. No respiratory distress.     Breath sounds: Normal breath sounds. No wheezing, rhonchi or rales.  Abdominal:     General: Bowel sounds are normal. There is no distension.     Palpations: Abdomen is soft.     Tenderness: There is no abdominal tenderness. There is no guarding or rebound.  Musculoskeletal:     Cervical back: Neck supple.     Right lower leg: No edema.     Left lower leg: No edema.  Skin:    General: Skin is warm and dry.     Coloration: Skin is not jaundiced or pale.  Neurological:     General: No focal deficit present.     Mental Status: He is alert and oriented to person, place, and time. Mental status is at baseline.  Psychiatric:        Mood and Affect: Mood normal.        Behavior: Behavior normal.        Thought Content: Thought content normal.        Judgment: Judgment normal.      Assessment:  Mr. Ericsson Sciortino. is a 72 y.o. male  who presents today for Colonoscopy for Surveillance-personal history of colon polyps .  Plan:  Colonoscopy with possible intervention today  Colonoscopy with possible biopsy, control of bleeding, polypectomy, and interventions as necessary has been discussed with the patient/patient representative. Informed consent was obtained from the patient/patient representative after explaining the indication, nature, and risks of the procedure including but not limited to death, bleeding, perforation, missed neoplasm/lesions, cardiorespiratory compromise, and reaction to medications. Opportunity for questions was given and appropriate answers were provided. Patient/patient representative has verbalized understanding is amenable to undergoing the procedure.   Jaynie Collins, DO  [facility]  Portions of the record may have been created with voice recognition software. Occasional wrong-word or 'sound-a-like' substitutions may have  occurred due to the inherent limitations of voice recognition software.  Read the chart carefully and recognize, using context, where substitutions may have occurred.

## 2023-04-09 ENCOUNTER — Ambulatory Visit: Payer: Medicare Other | Admitting: Anesthesiology

## 2023-04-09 ENCOUNTER — Ambulatory Visit
Admission: RE | Admit: 2023-04-09 | Discharge: 2023-04-09 | Disposition: A | Discharge status: Home / Self Care | Payer: Medicare Other | Attending: Gastroenterology | Admitting: Gastroenterology

## 2023-04-09 ENCOUNTER — Encounter
Admission: RE | Disposition: A | Discharge status: Home / Self Care | Payer: Self-pay | Source: Home / Self Care | Attending: Gastroenterology

## 2023-04-09 ENCOUNTER — Encounter: Payer: Self-pay | Admitting: Gastroenterology

## 2023-04-09 ENCOUNTER — Other Ambulatory Visit: Payer: Self-pay

## 2023-04-09 DIAGNOSIS — I4891 Unspecified atrial fibrillation: Secondary | ICD-10-CM | POA: Insufficient documentation

## 2023-04-09 DIAGNOSIS — Z09 Encounter for follow-up examination after completed treatment for conditions other than malignant neoplasm: Secondary | ICD-10-CM | POA: Diagnosis present

## 2023-04-09 DIAGNOSIS — Z85828 Personal history of other malignant neoplasm of skin: Secondary | ICD-10-CM | POA: Diagnosis not present

## 2023-04-09 DIAGNOSIS — I130 Hypertensive heart and chronic kidney disease with heart failure and stage 1 through stage 4 chronic kidney disease, or unspecified chronic kidney disease: Secondary | ICD-10-CM | POA: Insufficient documentation

## 2023-04-09 DIAGNOSIS — K5730 Diverticulosis of large intestine without perforation or abscess without bleeding: Secondary | ICD-10-CM | POA: Diagnosis not present

## 2023-04-09 DIAGNOSIS — Z83719 Family history of colon polyps, unspecified: Secondary | ICD-10-CM | POA: Insufficient documentation

## 2023-04-09 DIAGNOSIS — K219 Gastro-esophageal reflux disease without esophagitis: Secondary | ICD-10-CM | POA: Diagnosis not present

## 2023-04-09 DIAGNOSIS — Z86010 Personal history of colonic polyps: Secondary | ICD-10-CM | POA: Diagnosis not present

## 2023-04-09 DIAGNOSIS — D123 Benign neoplasm of transverse colon: Secondary | ICD-10-CM | POA: Insufficient documentation

## 2023-04-09 DIAGNOSIS — E8801 Alpha-1-antitrypsin deficiency: Secondary | ICD-10-CM | POA: Diagnosis not present

## 2023-04-09 DIAGNOSIS — I5020 Unspecified systolic (congestive) heart failure: Secondary | ICD-10-CM | POA: Insufficient documentation

## 2023-04-09 DIAGNOSIS — Z7901 Long term (current) use of anticoagulants: Secondary | ICD-10-CM | POA: Diagnosis not present

## 2023-04-09 DIAGNOSIS — G4730 Sleep apnea, unspecified: Secondary | ICD-10-CM | POA: Diagnosis not present

## 2023-04-09 DIAGNOSIS — K760 Fatty (change of) liver, not elsewhere classified: Secondary | ICD-10-CM | POA: Insufficient documentation

## 2023-04-09 DIAGNOSIS — Z86711 Personal history of pulmonary embolism: Secondary | ICD-10-CM | POA: Diagnosis not present

## 2023-04-09 DIAGNOSIS — D120 Benign neoplasm of cecum: Secondary | ICD-10-CM | POA: Diagnosis not present

## 2023-04-09 DIAGNOSIS — K7460 Unspecified cirrhosis of liver: Secondary | ICD-10-CM | POA: Insufficient documentation

## 2023-04-09 DIAGNOSIS — D124 Benign neoplasm of descending colon: Secondary | ICD-10-CM | POA: Insufficient documentation

## 2023-04-09 DIAGNOSIS — K621 Rectal polyp: Secondary | ICD-10-CM | POA: Insufficient documentation

## 2023-04-09 DIAGNOSIS — N189 Chronic kidney disease, unspecified: Secondary | ICD-10-CM | POA: Diagnosis not present

## 2023-04-09 DIAGNOSIS — J449 Chronic obstructive pulmonary disease, unspecified: Secondary | ICD-10-CM | POA: Insufficient documentation

## 2023-04-09 HISTORY — PX: COLONOSCOPY WITH PROPOFOL: SHX5780

## 2023-04-09 HISTORY — DX: Personal history of other endocrine, nutritional and metabolic disease: Z86.39

## 2023-04-09 HISTORY — PX: POLYPECTOMY: SHX5525

## 2023-04-09 SURGERY — COLONOSCOPY WITH PROPOFOL
Anesthesia: General

## 2023-04-09 MED ORDER — SODIUM CHLORIDE 0.9 % IV SOLN: INTRAVENOUS | Status: DC | PRN

## 2023-04-09 MED ORDER — ONDANSETRON HCL 4 MG/2ML IJ SOLN
INTRAMUSCULAR | Status: DC | PRN
  Administered 2023-04-09: 4 mg via INTRAVENOUS

## 2023-04-09 MED ORDER — SODIUM CHLORIDE 0.9 % IV SOLN: INTRAVENOUS | Status: DC

## 2023-04-09 MED ORDER — PROPOFOL 500 MG/50ML IV EMUL
INTRAVENOUS | Status: DC | PRN
  Administered 2023-04-09: 150 ug/kg/min via INTRAVENOUS
  Administered 2023-04-09: 80 mg via INTRAVENOUS

## 2023-04-09 NOTE — Transfer of Care (Signed)
Immediate Anesthesia Transfer of Care Note  Patient: Billy Freeman.  Procedure(s) Performed: COLONOSCOPY WITH PROPOFOL POLYPECTOMY  Patient Location: PACU  Anesthesia Type:MAC  Level of Consciousness: drowsy  Airway & Oxygen Therapy: Patient Spontanous Breathing  Post-op Assessment: Report given to RN and Post -op Vital signs reviewed and stable  Post vital signs: Reviewed  Last Vitals:  Vitals Value Taken Time  BP 127/82   Temp 64F   Pulse 81 04/09/23 1208  Resp 13 04/09/23 1208  SpO2 100 % 04/09/23 1208  Vitals shown include unfiled device data.  Last Pain:  Vitals:   04/09/23 1109  TempSrc: Temporal  PainSc: 0-No pain         Complications: No notable events documented.

## 2023-04-09 NOTE — Anesthesia Postprocedure Evaluation (Signed)
Anesthesia Post Note  Patient: Billy Freeman.  Procedure(s) Performed: COLONOSCOPY WITH PROPOFOL POLYPECTOMY  Patient location during evaluation: PACU Anesthesia Type: General Level of consciousness: awake and alert, oriented and patient cooperative Pain management: pain level controlled Vital Signs Assessment: post-procedure vital signs reviewed and stable Respiratory status: spontaneous breathing, nonlabored ventilation and respiratory function stable Cardiovascular status: blood pressure returned to baseline and stable Postop Assessment: adequate PO intake Anesthetic complications: no   No notable events documented.   Last Vitals:  Vitals:   04/09/23 1220 04/09/23 1230  BP:    Pulse:    Resp:    Temp:    SpO2: 99% 100%    Last Pain:  Vitals:   04/09/23 1210  TempSrc: Temporal  PainSc:                  Reed Breech

## 2023-04-09 NOTE — Interval H&P Note (Signed)
History and Physical Interval Note: Preprocedure H&P from 04/09/23  was reviewed and there was no interval change after seeing and examining the patient.  Written consent was obtained from the patient after discussion of risks, benefits, and alternatives. Patient has consented to proceed with Colonoscopy with possible intervention   04/09/2023 11:20 AM  Billy Freeman.  has presented today for surgery, with the diagnosis of V12.72 (ICD-9-CM) - Z86.010 (ICD-10-CM) - Hx of adenomatous colonic polyps.  The various methods of treatment have been discussed with the patient and family. After consideration of risks, benefits and other options for treatment, the patient has consented to  Procedure(s): COLONOSCOPY WITH PROPOFOL (N/A) as a surgical intervention.  The patient's history has been reviewed, patient examined, no change in status, stable for surgery.  I have reviewed the patient's chart and labs.  Questions were answered to the patient's satisfaction.     Jaynie Collins

## 2023-04-09 NOTE — Anesthesia Preprocedure Evaluation (Signed)
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)   Pulmonary exam normal breath sounds clear to auscultation       Cardiovascular hypertension, Normal cardiovascular exam+ dysrhythmias (a fib s/p ablation on Eliquis)  Rhythm:Regular Rate:Normal     Neuro/Psych  PSYCHIATRIC DISORDERS  Depression    Edible marijuana use; none over past two days; HOH    GI/Hepatic ,GERD  ,,  Endo/Other  negative endocrine ROS    Renal/GU Renal disease (CKD; nephrolithiasis)     Musculoskeletal   Abdominal   Peds  Hematology negative hematology ROS (+)   Anesthesia Other Findings   Reproductive/Obstetrics                             Anesthesia Physical Anesthesia Plan  ASA: 3  Anesthesia Plan: General   Post-op Pain Management:    Induction: Intravenous  PONV Risk Score and Plan: 2 and Propofol infusion, TIVA and Treatment may vary due to age or medical condition  Airway Management Planned: Natural Airway  Additional Equipment:   Intra-op Plan:   Post-operative Plan:   Informed Consent: I have reviewed the patients History and Physical, chart, labs and discussed the procedure including the risks, benefits and alternatives for the proposed anesthesia with the patient or authorized representative who has indicated his/her understanding and acceptance.       Plan Discussed with: CRNA  Anesthesia Plan Comments: (LMA/GETA backup discussed.  Patient consented for risks of anesthesia including but not limited to:  - adverse  reactions to medications - damage to eyes, teeth, lips or other oral mucosa - nerve damage due to positioning  - sore throat or hoarseness - damage to heart, brain, nerves, lungs, other parts of body or loss of life  Informed patient about role of CRNA in peri- and intra-operative care.  Patient voiced understanding.)       Anesthesia Quick Evaluation

## 2023-04-09 NOTE — Op Note (Signed)
[facility] Patient Name: Billy Freeman Procedure Date: 04/09/2023 11:06 AM MRN: [id number] Account #: [id number] Date of Birth: 19-Dec-1950 Admit Type: Outpatient Age: 72 Room: [facility] ENDO ROOM 1 Gender: Male Note Status: Finalized Instrument Name: Peds Colonoscope [id number] Procedure:             Colonoscopy Indications:           High risk colon cancer surveillance: Personal history                         of colonic polyps Providers:             Jaynie Collins DO, DO Referring MD:          Barbette Reichmann, MD (Referring MD) Medicines:             Monitored Anesthesia Care Complications:         No immediate complications. Estimated blood loss:                         Minimal. Procedure:             Pre-Anesthesia Assessment:                        - Prior to the procedure, a History and Physical was                         performed, and patient medications and allergies were                         reviewed. The patient is competent. The risks and                         benefits of the procedure and the sedation options and                         risks were discussed with the patient. All questions                         were answered and informed consent was obtained.                         Patient identification and proposed procedure were                         verified by the physician, the nurse, the anesthetist                         and the technician in the endoscopy suite. Mental                         Status Examination: alert and oriented. Airway                         Examination: normal oropharyngeal airway and neck                         mobility. Respiratory Examination: clear to  auscultation. CV Examination: RRR, no murmurs, no S3                         or S4. Prophylactic Antibiotics: The patient does not                         require prophylactic antibiotics. Prior                          Anticoagulants: The patient has taken Eliquis                         (apixaban), last dose was 2 days prior to procedure.                         ASA Grade Assessment: III - A patient with severe                         systemic disease. After reviewing the risks and                         benefits, the patient was deemed in satisfactory                         condition to undergo the procedure. The anesthesia                         plan was to use monitored anesthesia care (MAC).                         Immediately prior to administration of medications,                         the patient was re-assessed for adequacy to receive                         sedatives. The heart rate, respiratory rate, oxygen                         saturations, blood pressure, adequacy of pulmonary                         ventilation, and response to care were monitored                         throughout the procedure. The physical status of the                         patient was re-assessed after the procedure.                        After obtaining informed consent, the colonoscope was                         passed under direct vision. Throughout the procedure,                         the patient's blood pressure, pulse, and oxygen  saturations were monitored continuously. The                         Colonoscope was introduced through the anus and                         advanced to the the terminal ileum, with                         identification of the appendiceal orifice and IC                         valve. The colonoscopy was somewhat difficult due to                         multiple diverticula in the colon. Successful                         completion of the procedure was aided by straightening                         and shortening the scope to obtain bowel loop                         reduction, using scope torsion, applying abdominal                         pressure  and lavage. The patient tolerated the                         procedure well. The quality of the bowel preparation                         was evaluated using the [facility] Bowel Preparation                         Scale) with scores of: Right Colon = 2 (minor amount                         of residual staining, small fragments of stool and/or                         opaque liquid, but mucosa seen well), Transverse Colon                         = 3 (entire mucosa seen well with no residual                         staining, small fragments of stool or opaque liquid)                         and Left Colon = 3 (entire mucosa seen well with no                         residual staining, small fragments of stool or opaque  liquid). The total BBPS score equals 8. The quality of                         the bowel preparation was excellent. The terminal                         ileum, ileocecal valve, appendiceal orifice, and                         rectum were photographed. Findings:      The perianal and digital rectal examinations were normal. Pertinent       negatives include normal sphincter tone.      The terminal ileum appeared normal. Estimated blood loss: none.      Multiple small-mouthed diverticula were found in the left colon.       Estimated blood loss: none.      Six sessile polyps were found in the rectum (1), descending colon (1),       transverse colon (2) and cecum (2). The polyps were 1 to 2 mm in size.       These polyps were removed with a jumbo cold forceps. Resection and       retrieval were complete. Estimated blood loss was minimal.      A 3 to 4 mm polyp was found in the descending colon. The polyp was       sessile. The polyp was removed with a cold snare. Resection and       retrieval were complete. Estimated blood loss was minimal.      Pediatric colonoscope used      The exam was otherwise without abnormality on direct and retroflexion        views. Impression:            - The examined portion of the ileum was normal.                        - Diverticulosis in the left colon.                        - Six 1 to 2 mm polyps in the rectum, in the                         descending colon, in the transverse colon and in the                         cecum, removed with a jumbo cold forceps. Resected and                         retrieved.                        - One 3 to 4 mm polyp in the descending colon, removed                         with a cold snare. Resected and retrieved.                        - The examination was otherwise normal on direct and  retroflexion views. Recommendation:        - Patient has a contact number available for                         emergencies. The signs and symptoms of potential                         delayed complications were discussed with the patient.                         Return to normal activities tomorrow. Written                         discharge instructions were provided to the patient.                        - Discharge patient to home.                        - Resume previous diet.                        - Continue present medications.                        - Await pathology results.                        - Repeat colonoscopy for surveillance based on                         pathology results.                        - Return to referring physician as previously                         scheduled.                        - The findings and recommendations were discussed with                         the patient. Procedure Code(s):     --- Professional ---                        [phone], Colonoscopy, flexible; with removal of                         tumor(s), polyp(s), or other lesion(s) by snare                         technique                        45380, 59, Colonoscopy, flexible; with biopsy, single                         or multiple Diagnosis Code(s):      --- Professional ---  Z86.010, Personal history of colonic polyps                        D12.8, Benign neoplasm of rectum                        D12.4, Benign neoplasm of descending colon                        D12.3, Benign neoplasm of transverse colon (hepatic                         flexure or splenic flexure)                        D12.0, Benign neoplasm of cecum                        K57.30, Diverticulosis of large intestine without                         perforation or abscess without bleeding CPT copyright 2022 [employer]. All rights reserved. The codes documented in this report are preliminary and upon coder review may  be revised to meet current compliance requirements. Attending Participation:      I personally performed the entire procedure. Elfredia Nevins, DO Jaynie Collins DO, DO 04/09/2023 12:08:59 PM This report has been signed electronically. Number of Addenda: 0 Note Initiated On: 04/09/2023 11:06 AM Scope Withdrawal Time: 0 hours 23 minutes 26 seconds  Total Procedure Duration: 0 hours 35 minutes 18 seconds  Estimated Blood Loss:  Estimated blood loss was minimal.      [facility]

## 2023-04-10 ENCOUNTER — Encounter: Payer: Self-pay | Admitting: Gastroenterology

## 2023-04-13 ENCOUNTER — Ambulatory Visit
Admission: RE | Admit: 2023-04-13 | Discharge: 2023-04-13 | Disposition: A | Discharge status: Home / Self Care | Payer: Medicare Other | Source: Ambulatory Visit | Attending: Student in an Organized Health Care Education/Training Program

## 2023-04-13 ENCOUNTER — Other Ambulatory Visit: Payer: Self-pay

## 2023-04-13 DIAGNOSIS — E8801 Alpha-1-antitrypsin deficiency: Secondary | ICD-10-CM | POA: Diagnosis present

## 2023-04-13 LAB — SURGICAL PATHOLOGY

## 2023-04-13 MED ORDER — ALPHA1-PROTEINASE INHIBITOR 1000 MG/20ML IV SOLN
5135.0000 mg | INTRAVENOUS | Status: DC
  Administered 2023-04-13: 5135 mg via INTRAVENOUS
  Filled 2023-04-13: qty 5135

## 2023-04-20 ENCOUNTER — Ambulatory Visit
Admission: RE | Admit: 2023-04-20 | Discharge: 2023-04-20 | Disposition: A | Discharge status: Home / Self Care | Payer: Medicare Other | Source: Ambulatory Visit | Attending: Student in an Organized Health Care Education/Training Program | Admitting: Student in an Organized Health Care Education/Training Program

## 2023-04-20 DIAGNOSIS — E8801 Alpha-1-antitrypsin deficiency: Secondary | ICD-10-CM | POA: Diagnosis present

## 2023-04-20 MED ORDER — ALPHA1-PROTEINASE INHIBITOR 1000 MG/20ML IV SOLN
5095.0000 mg | Freq: Once | INTRAVENOUS | Status: AC
  Administered 2023-04-20: 5095 mg via INTRAVENOUS
  Filled 2023-04-20: qty 5095
  Filled 2023-04-20: qty 6000

## 2023-04-27 ENCOUNTER — Ambulatory Visit
Admission: RE | Admit: 2023-04-27 | Discharge: 2023-04-27 | Disposition: A | Discharge status: Home / Self Care | Payer: Medicare Other | Source: Ambulatory Visit | Attending: Internal Medicine | Admitting: Internal Medicine

## 2023-04-27 DIAGNOSIS — E8801 Alpha-1-antitrypsin deficiency: Secondary | ICD-10-CM | POA: Insufficient documentation

## 2023-04-27 MED ORDER — ALPHA1-PROTEINASE INHIBITOR 1000 MG/20ML IV SOLN
60.0000 mg/kg | INTRAVENOUS | Status: DC
  Filled 2023-04-27: qty 6000

## 2023-04-27 MED ORDER — ALPHA1-PROTEINASE INHIBITOR 1000 MG/20ML IV SOLN
5095.0000 mg | INTRAVENOUS | Status: DC
  Administered 2023-04-27: 5095 mg via INTRAVENOUS
  Filled 2023-04-27: qty 5095
  Filled 2023-04-27: qty 6000

## 2023-05-04 ENCOUNTER — Ambulatory Visit
Admission: RE | Admit: 2023-05-04 | Discharge: 2023-05-04 | Disposition: A | Discharge status: Home / Self Care | Payer: Medicare Other | Source: Ambulatory Visit | Attending: Student in an Organized Health Care Education/Training Program | Admitting: Student in an Organized Health Care Education/Training Program

## 2023-05-04 DIAGNOSIS — E8801 Alpha-1-antitrypsin deficiency: Secondary | ICD-10-CM | POA: Diagnosis present

## 2023-05-04 MED ORDER — ALPHA1-PROTEINASE INHIBITOR 1000 MG/20ML IV SOLN
5095.0000 mg | Freq: Once | INTRAVENOUS | Status: AC
  Administered 2023-05-04: 5095 mg via INTRAVENOUS
  Filled 2023-05-04: qty 5095

## 2023-05-04 MED ORDER — SODIUM CHLORIDE 0.9 % IV SOLN: INTRAVENOUS | Status: DC

## 2023-05-08 MED ORDER — ALPHA1-PROTEINASE INHIBITOR 1000 MG/20ML IV SOLN: 60.0000 mg/kg | INTRAVENOUS | Status: DC

## 2023-05-08 MED ORDER — ALPHA1-PROTEINASE INHIBITOR 1000 MG/20ML IV SOLN: 5000.0000 mg | INTRAVENOUS | Status: DC

## 2023-05-11 ENCOUNTER — Ambulatory Visit
Admission: RE | Admit: 2023-05-11 | Discharge: 2023-05-11 | Disposition: A | Discharge status: Home / Self Care | Payer: Medicare Other | Source: Ambulatory Visit | Attending: Student in an Organized Health Care Education/Training Program

## 2023-05-11 DIAGNOSIS — Z01818 Encounter for other preprocedural examination: Secondary | ICD-10-CM | POA: Insufficient documentation

## 2023-05-11 MED ORDER — EMPTY CONTAINERS FLEXIBLE MISC
5060.0000 mg | Freq: Once | Status: AC
  Administered 2023-05-11: 5060 mg via INTRAVENOUS
  Filled 2023-05-11: qty 1012

## 2023-05-18 ENCOUNTER — Ambulatory Visit
Admission: RE | Admit: 2023-05-18 | Discharge: 2023-05-18 | Disposition: A | Discharge status: Home / Self Care | Payer: Medicare Other | Source: Ambulatory Visit | Attending: Student in an Organized Health Care Education/Training Program | Admitting: Student in an Organized Health Care Education/Training Program

## 2023-05-18 DIAGNOSIS — Z79899 Other long term (current) drug therapy: Secondary | ICD-10-CM | POA: Insufficient documentation

## 2023-05-18 DIAGNOSIS — E8801 Alpha-1-antitrypsin deficiency: Secondary | ICD-10-CM | POA: Insufficient documentation

## 2023-05-18 MED ORDER — ALPHA1-PROTEINASE INHIBITOR 1000 MG/20ML IV SOLN
5060.0000 mg | INTRAVENOUS | Status: DC
  Administered 2023-05-18: 5060 mg via INTRAVENOUS
  Filled 2023-05-18: qty 5060
  Filled 2023-05-18: qty 5000

## 2023-05-25 ENCOUNTER — Ambulatory Visit
Admission: RE | Admit: 2023-05-25 | Discharge: 2023-05-25 | Disposition: A | Discharge status: Home / Self Care | Payer: Medicare Other | Source: Ambulatory Visit | Attending: Student in an Organized Health Care Education/Training Program | Admitting: Student in an Organized Health Care Education/Training Program

## 2023-05-25 DIAGNOSIS — E8801 Alpha-1-antitrypsin deficiency: Secondary | ICD-10-CM | POA: Insufficient documentation

## 2023-05-25 MED ORDER — EMPTY CONTAINERS FLEXIBLE MISC
5150.0000 mg | Status: DC
  Administered 2023-05-25: 5150 mg via INTRAVENOUS
  Filled 2023-05-25: qty 5150

## 2023-05-25 MED ORDER — ALPHA1-PROTEINASE INHIBITOR 1000 MG/20ML IV SOLN
5000.0000 mg | INTRAVENOUS | Status: DC
  Filled 2023-05-25 (×2): qty 5000

## 2023-06-01 ENCOUNTER — Ambulatory Visit
Admission: RE | Admit: 2023-06-01 | Discharge: 2023-06-01 | Disposition: A | Discharge status: Home / Self Care | Payer: Medicare Other | Source: Ambulatory Visit | Attending: Student in an Organized Health Care Education/Training Program | Admitting: Student in an Organized Health Care Education/Training Program

## 2023-06-01 DIAGNOSIS — E8801 Alpha-1-antitrypsin deficiency: Secondary | ICD-10-CM | POA: Insufficient documentation

## 2023-06-01 MED ORDER — ALPHA1-PROTEINASE INHIBITOR 1000 MG/20ML IV SOLN
5000.0000 mg | INTRAVENOUS | Status: DC
  Filled 2023-06-01: qty 5000

## 2023-06-01 MED ORDER — ALPHA1-PROTEINASE INHIBITOR 1000 MG/20ML IV SOLN
5150.0000 mg | INTRAVENOUS | Status: DC
  Administered 2023-06-01: 5150 mg via INTRAVENOUS
  Filled 2023-06-01: qty 5150
  Filled 2023-06-01: qty 6000

## 2023-06-08 ENCOUNTER — Ambulatory Visit
Admission: RE | Admit: 2023-06-08 | Discharge: 2023-06-08 | Disposition: A | Discharge status: Home / Self Care | Payer: Medicare Other | Source: Ambulatory Visit | Attending: Student in an Organized Health Care Education/Training Program | Admitting: Student in an Organized Health Care Education/Training Program

## 2023-06-08 DIAGNOSIS — E8801 Alpha-1-antitrypsin deficiency: Secondary | ICD-10-CM | POA: Insufficient documentation

## 2023-06-08 MED ORDER — EMPTY CONTAINERS FLEXIBLE MISC
5114.0000 mg | Status: DC
  Administered 2023-06-08: 5114 mg via INTRAVENOUS
  Filled 2023-06-08: qty 5114

## 2023-06-08 MED ORDER — ALPHA1-PROTEINASE INHIBITOR 1000 MG/20ML IV SOLN
60.0000 mg/kg | INTRAVENOUS | Status: DC
Start: 2023-06-08 — End: 2023-06-08

## 2023-06-15 ENCOUNTER — Ambulatory Visit
Admission: RE | Admit: 2023-06-15 | Discharge: 2023-06-15 | Disposition: A | Discharge status: Home / Self Care | Payer: Medicare Other | Source: Ambulatory Visit | Attending: Student in an Organized Health Care Education/Training Program

## 2023-06-15 DIAGNOSIS — Z01818 Encounter for other preprocedural examination: Secondary | ICD-10-CM | POA: Diagnosis present

## 2023-06-15 MED ORDER — EMPTY CONTAINERS FLEXIBLE MISC
5290.0000 mg | Status: DC
  Administered 2023-06-15: 5290 mg via INTRAVENOUS
  Filled 2023-06-15: qty 5290

## 2023-06-15 MED ORDER — ALPHA1-PROTEINASE INHIBITOR 1000 MG/20ML IV SOLN
5000.0000 mg | INTRAVENOUS | Status: DC
Start: 2023-06-15 — End: 2023-06-15

## 2023-06-22 ENCOUNTER — Ambulatory Visit
Admission: RE | Admit: 2023-06-22 | Discharge: 2023-06-22 | Disposition: A | Discharge status: Home / Self Care | Payer: Medicare Other | Source: Ambulatory Visit | Attending: Student in an Organized Health Care Education/Training Program

## 2023-06-22 DIAGNOSIS — E8801 Alpha-1-antitrypsin deficiency: Secondary | ICD-10-CM | POA: Insufficient documentation

## 2023-06-22 MED ORDER — ALPHA1-PROTEINASE INHIBITOR 1000 MG/20ML IV SOLN
5290.0000 mg | INTRAVENOUS | Status: DC
  Administered 2023-06-22: 5290 mg via INTRAVENOUS
  Filled 2023-06-22: qty 5290

## 2023-07-06 ENCOUNTER — Encounter
Admission: RE | Admit: 2023-07-06 | Discharge: 2023-07-06 | Disposition: A | Discharge status: Home / Self Care | Payer: Medicare Other | Source: Ambulatory Visit | Attending: Student in an Organized Health Care Education/Training Program

## 2023-07-06 DIAGNOSIS — Z01818 Encounter for other preprocedural examination: Secondary | ICD-10-CM | POA: Diagnosis present

## 2023-07-06 MED ORDER — ALPHA1-PROTEINASE INHIBITOR 1000 MG/20ML IV SOLN: 5000.0000 mg | Freq: Once | INTRAVENOUS | Status: DC

## 2023-07-06 MED ORDER — EMPTY CONTAINERS FLEXIBLE MISC
5188.0000 mg | Status: DC
  Administered 2023-07-06: 5188 mg via INTRAVENOUS
  Filled 2023-07-06: qty 5188

## 2023-07-13 ENCOUNTER — Ambulatory Visit: Payer: Medicare Other

## 2023-07-13 ENCOUNTER — Ambulatory Visit
Admission: RE | Admit: 2023-07-13 | Discharge: 2023-07-13 | Disposition: A | Discharge status: Home / Self Care | Payer: Medicare Other | Source: Ambulatory Visit | Attending: Student in an Organized Health Care Education/Training Program | Admitting: Student in an Organized Health Care Education/Training Program

## 2023-07-13 DIAGNOSIS — E8801 Alpha-1-antitrypsin deficiency: Secondary | ICD-10-CM | POA: Insufficient documentation

## 2023-07-13 MED ORDER — EMPTY CONTAINERS FLEXIBLE MISC
5198.0000 mg | Status: DC
  Administered 2023-07-13: 5198 mg via INTRAVENOUS
  Filled 2023-07-13: qty 5198

## 2023-07-13 MED ORDER — ALPHA1-PROTEINASE INHIBITOR 1000 MG/20ML IV SOLN: 5000.0000 mg | Freq: Once | INTRAVENOUS | Status: DC

## 2023-07-15 ENCOUNTER — Encounter: Payer: Self-pay | Admitting: Podiatry

## 2023-07-15 ENCOUNTER — Ambulatory Visit (INDEPENDENT_AMBULATORY_CARE_PROVIDER_SITE_OTHER): Payer: Medicare Other | Admitting: Podiatry

## 2023-07-15 DIAGNOSIS — Z461 Encounter for fitting and adjustment of hearing aid: Secondary | ICD-10-CM | POA: Insufficient documentation

## 2023-07-15 DIAGNOSIS — M2031 Hallux varus (acquired), right foot: Secondary | ICD-10-CM | POA: Diagnosis not present

## 2023-07-15 DIAGNOSIS — H9319 Tinnitus, unspecified ear: Secondary | ICD-10-CM | POA: Insufficient documentation

## 2023-07-15 NOTE — Progress Notes (Signed)
He presents today concerned that he is starting to redevelop the fungus on the hallux left again.  And he is concerned about the second toe on the right foot overlapping the hallux and could this be causing balance issues with him.  Objective: Vital signs are stable he is alert and oriented x 3.  Pulses are palpable.  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  The hallux nail left appears to be dead and appears to be growing off as a new nail is pushing it off.  It does not appear to be growing to the bed.  I think that this is more than likely came from the laser therapy but should go ahead and grow off in the future.  He states that it is not hurting him.  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  And nail dystrophy hallux left.  Plan: I did discuss with him that there is really nothing that she could do if he is having balance issues with his third toe overlapping the hallux.  Even fusing the hallux would not prevent balance issues.  He understands and is amenable to it he does have a [employer] at home so that he can practice his balancing.  I recommended highly that he can do this I will follow-up with him on as-needed basis.

## 2023-07-20 ENCOUNTER — Ambulatory Visit: Payer: Medicare Other

## 2023-07-20 ENCOUNTER — Ambulatory Visit
Admission: RE | Admit: 2023-07-20 | Discharge: 2023-07-20 | Disposition: A | Discharge status: Home / Self Care | Payer: Medicare Other | Source: Ambulatory Visit | Attending: Student in an Organized Health Care Education/Training Program | Admitting: Student in an Organized Health Care Education/Training Program

## 2023-07-20 DIAGNOSIS — E8801 Alpha-1-antitrypsin deficiency: Secondary | ICD-10-CM | POA: Diagnosis present

## 2023-07-20 DIAGNOSIS — Z7969 Long term (current) use of other immunomodulators and immunosuppressants: Secondary | ICD-10-CM | POA: Diagnosis not present

## 2023-07-20 MED ORDER — ALPHA1-PROTEINASE INHIBITOR 1000 MG/20ML IV SOLN
5215.0000 mg | INTRAVENOUS | Status: DC
  Administered 2023-07-20: 5215 mg via INTRAVENOUS
  Filled 2023-07-20: qty 5215
  Filled 2023-07-20: qty 5000

## 2023-07-27 ENCOUNTER — Ambulatory Visit
Admission: RE | Admit: 2023-07-27 | Discharge: 2023-07-27 | Disposition: A | Discharge status: Home / Self Care | Payer: Medicare Other | Source: Ambulatory Visit | Attending: Student in an Organized Health Care Education/Training Program

## 2023-07-27 DIAGNOSIS — E8801 Alpha-1-antitrypsin deficiency: Secondary | ICD-10-CM | POA: Insufficient documentation

## 2023-07-27 MED ORDER — ALPHA1-PROTEINASE INHIBITOR 1000 MG/20ML IV SOLN
5215.0000 mg | INTRAVENOUS | Status: DC
  Administered 2023-07-27: 5215 mg via INTRAVENOUS
  Filled 2023-07-27: qty 5215

## 2023-08-03 ENCOUNTER — Ambulatory Visit
Admission: RE | Admit: 2023-08-03 | Discharge: 2023-08-03 | Disposition: A | Discharge status: Home / Self Care | Payer: Medicare Other | Source: Ambulatory Visit | Attending: Student in an Organized Health Care Education/Training Program | Admitting: Student in an Organized Health Care Education/Training Program

## 2023-08-03 DIAGNOSIS — E8801 Alpha-1-antitrypsin deficiency: Secondary | ICD-10-CM | POA: Insufficient documentation

## 2023-08-03 MED ORDER — ALPHA1-PROTEINASE INHIBITOR 1000 MG/20ML IV SOLN
5215.0000 mg | Freq: Once | INTRAVENOUS | Status: AC
  Administered 2023-08-03: 5215 mg via INTRAVENOUS
  Filled 2023-08-03: qty 5215

## 2023-08-10 ENCOUNTER — Ambulatory Visit
Admission: RE | Admit: 2023-08-10 | Discharge: 2023-08-10 | Disposition: A | Discharge status: Home / Self Care | Payer: Medicare Other | Source: Ambulatory Visit | Attending: Student in an Organized Health Care Education/Training Program | Admitting: Student in an Organized Health Care Education/Training Program

## 2023-08-10 DIAGNOSIS — E8801 Alpha-1-antitrypsin deficiency: Secondary | ICD-10-CM | POA: Diagnosis present

## 2023-08-10 MED ORDER — EMPTY CONTAINERS FLEXIBLE MISC
5190.0000 mg | Status: DC
  Administered 2023-08-10: 5190 mg via INTRAVENOUS
  Filled 2023-08-10: qty 5190

## 2023-08-10 MED ORDER — ALPHA1-PROTEINASE INHIBITOR 1000 MG/20ML IV SOLN
60.0000 mg/kg | INTRAVENOUS | Status: DC
Start: 2023-08-10 — End: 2023-08-10

## 2023-08-11 ENCOUNTER — Other Ambulatory Visit: Payer: Self-pay | Admitting: Gastroenterology

## 2023-08-11 DIAGNOSIS — K7460 Unspecified cirrhosis of liver: Secondary | ICD-10-CM

## 2023-08-17 ENCOUNTER — Ambulatory Visit
Admission: RE | Admit: 2023-08-17 | Discharge: 2023-08-17 | Disposition: A | Discharge status: Home / Self Care | Payer: Medicare Other | Source: Ambulatory Visit | Attending: Internal Medicine | Admitting: Internal Medicine

## 2023-08-17 DIAGNOSIS — Z79899 Other long term (current) drug therapy: Secondary | ICD-10-CM | POA: Diagnosis not present

## 2023-08-17 DIAGNOSIS — E8801 Alpha-1-antitrypsin deficiency: Secondary | ICD-10-CM | POA: Diagnosis present

## 2023-08-17 MED ORDER — EMPTY CONTAINERS FLEXIBLE MISC
5190.0000 mg | Status: DC
  Administered 2023-08-17: 5190 mg via INTRAVENOUS
  Filled 2023-08-17: qty 5190

## 2023-08-17 MED ORDER — ALPHA1-PROTEINASE INHIBITOR 1000 MG/20ML IV SOLN
5000.0000 mg | INTRAVENOUS | Status: DC
Start: 2023-08-17 — End: 2023-08-17

## 2023-08-24 ENCOUNTER — Ambulatory Visit
Admission: RE | Admit: 2023-08-24 | Discharge: 2023-08-24 | Disposition: A | Discharge status: Home / Self Care | Payer: Medicare Other | Source: Ambulatory Visit | Attending: Student in an Organized Health Care Education/Training Program

## 2023-08-24 DIAGNOSIS — E8801 Alpha-1-antitrypsin deficiency: Secondary | ICD-10-CM | POA: Insufficient documentation

## 2023-08-24 MED ORDER — ALPHA1-PROTEINASE INHIBITOR 1000 MG/20ML IV SOLN
5190.0000 mg | Freq: Once | INTRAVENOUS | Status: AC
  Administered 2023-08-24: 5190 mg via INTRAVENOUS
  Filled 2023-08-24: qty 5190
  Filled 2023-08-24: qty 6000

## 2023-08-26 ENCOUNTER — Ambulatory Visit
Admission: RE | Admit: 2023-08-26 | Discharge: 2023-08-26 | Disposition: A | Discharge status: Home / Self Care | Payer: Medicare Other | Source: Ambulatory Visit | Attending: Gastroenterology

## 2023-08-26 DIAGNOSIS — K7460 Unspecified cirrhosis of liver: Secondary | ICD-10-CM

## 2023-08-31 ENCOUNTER — Ambulatory Visit
Admission: RE | Admit: 2023-08-31 | Discharge: 2023-08-31 | Disposition: A | Discharge status: Home / Self Care | Payer: Medicare Other | Source: Ambulatory Visit | Attending: Student in an Organized Health Care Education/Training Program

## 2023-08-31 DIAGNOSIS — E8801 Alpha-1-antitrypsin deficiency: Secondary | ICD-10-CM | POA: Diagnosis present

## 2023-08-31 MED ORDER — ALPHA1-PROTEINASE INHIBITOR 1000 MG/20ML IV SOLN
5000.0000 mg | Freq: Once | INTRAVENOUS | Status: DC
Start: 2023-08-31 — End: 2023-08-31

## 2023-08-31 MED ORDER — EMPTY CONTAINERS FLEXIBLE MISC
5030.0000 mg | Status: DC
  Administered 2023-08-31: 5030 mg via INTRAVENOUS
  Filled 2023-08-31: qty 5030

## 2023-09-07 ENCOUNTER — Ambulatory Visit
Admission: RE | Admit: 2023-09-07 | Discharge: 2023-09-07 | Disposition: A | Discharge status: Home / Self Care | Payer: Medicare Other | Source: Ambulatory Visit | Attending: Student in an Organized Health Care Education/Training Program

## 2023-09-07 DIAGNOSIS — E8801 Alpha-1-antitrypsin deficiency: Secondary | ICD-10-CM | POA: Insufficient documentation

## 2023-09-07 MED ORDER — ALPHA1-PROTEINASE INHIBITOR 1000 MG/20ML IV SOLN
5030.0000 mg | INTRAVENOUS | Status: DC
  Administered 2023-09-07: 5030 mg via INTRAVENOUS
  Filled 2023-09-07: qty 5000
  Filled 2023-09-07: qty 5030

## 2023-09-14 ENCOUNTER — Ambulatory Visit
Admission: RE | Admit: 2023-09-14 | Discharge: 2023-09-14 | Disposition: A | Discharge status: Home / Self Care | Payer: Medicare Other | Source: Ambulatory Visit | Attending: Student in an Organized Health Care Education/Training Program

## 2023-09-14 DIAGNOSIS — E8801 Alpha-1-antitrypsin deficiency: Secondary | ICD-10-CM | POA: Insufficient documentation

## 2023-09-14 DIAGNOSIS — J439 Emphysema, unspecified: Secondary | ICD-10-CM | POA: Diagnosis present

## 2023-09-14 MED ORDER — EMPTY CONTAINERS FLEXIBLE MISC
5030.0000 mg | Status: DC
  Administered 2023-09-14: 5030 mg via INTRAVENOUS
  Filled 2023-09-14: qty 5030

## 2023-09-14 MED ORDER — ALPHA1-PROTEINASE INHIBITOR 1000 MG/20ML IV SOLN: 60.0000 mg/kg | INTRAVENOUS | Status: DC

## 2023-09-21 ENCOUNTER — Ambulatory Visit
Admission: RE | Admit: 2023-09-21 | Discharge: 2023-09-21 | Disposition: A | Discharge status: Home / Self Care | Payer: Medicare Other | Source: Ambulatory Visit | Attending: Student in an Organized Health Care Education/Training Program | Admitting: Student in an Organized Health Care Education/Training Program

## 2023-09-21 DIAGNOSIS — E8801 Alpha-1-antitrypsin deficiency: Secondary | ICD-10-CM | POA: Insufficient documentation

## 2023-09-21 MED ORDER — ALPHA1-PROTEINASE INHIBITOR 1000 MG/20ML IV SOLN: 5000.0000 mg | INTRAVENOUS | Status: DC

## 2023-09-21 MED ORDER — ALPHA1-PROTEINASE INHIBITOR 1000 MG/20ML IV SOLN
60.0000 mg/kg | INTRAVENOUS | Status: DC
  Filled 2023-09-21: qty 6000

## 2023-09-21 MED ORDER — EMPTY CONTAINERS FLEXIBLE MISC
5030.0000 mg | Status: DC
  Administered 2023-09-21: 5030 mg via INTRAVENOUS
  Filled 2023-09-21: qty 5030

## 2023-09-28 ENCOUNTER — Ambulatory Visit
Admission: RE | Admit: 2023-09-28 | Discharge: 2023-09-28 | Disposition: A | Discharge status: Home / Self Care | Payer: Medicare Other | Source: Ambulatory Visit | Attending: Student in an Organized Health Care Education/Training Program

## 2023-09-28 DIAGNOSIS — E8801 Alpha-1-antitrypsin deficiency: Secondary | ICD-10-CM | POA: Insufficient documentation

## 2023-09-28 MED ORDER — ALPHA1-PROTEINASE INHIBITOR 1000 MG/20ML IV SOLN: 5000.0000 mg | INTRAVENOUS | Status: DC

## 2023-09-28 MED ORDER — EMPTY CONTAINERS FLEXIBLE MISC
5235.0000 mg | Status: DC
  Administered 2023-09-28: 5235 mg via INTRAVENOUS
  Filled 2023-09-28: qty 5235

## 2023-10-05 ENCOUNTER — Ambulatory Visit
Admission: RE | Admit: 2023-10-05 | Discharge: 2023-10-05 | Disposition: A | Discharge status: Home / Self Care | Source: Ambulatory Visit | Attending: Student in an Organized Health Care Education/Training Program | Admitting: Student in an Organized Health Care Education/Training Program

## 2023-10-05 DIAGNOSIS — E8801 Alpha-1-antitrypsin deficiency: Secondary | ICD-10-CM | POA: Insufficient documentation

## 2023-10-05 DIAGNOSIS — Z7969 Long term (current) use of other immunomodulators and immunosuppressants: Secondary | ICD-10-CM | POA: Diagnosis not present

## 2023-10-05 MED ORDER — EMPTY CONTAINERS FLEXIBLE MISC
5235.0000 mg | Freq: Once | INTRAVENOUS | Status: AC
Start: 2023-10-05 — End: 2023-10-05
  Administered 2023-10-05: 5235 mg via INTRAVENOUS
  Filled 2023-10-05: qty 5235

## 2023-10-08 NOTE — Addendum Note (Signed)
 Encounter addended by: Rosalita Chessman, RN on: 10/08/2023 10:57 AM  Actions taken: Charge Capture section accepted

## 2023-10-12 ENCOUNTER — Ambulatory Visit
Admission: RE | Admit: 2023-10-12 | Discharge: 2023-10-12 | Disposition: A | Discharge status: Home / Self Care | Source: Ambulatory Visit | Attending: Student in an Organized Health Care Education/Training Program | Admitting: Student in an Organized Health Care Education/Training Program

## 2023-10-12 DIAGNOSIS — E8801 Alpha-1-antitrypsin deficiency: Secondary | ICD-10-CM | POA: Diagnosis present

## 2023-10-12 MED ORDER — ALPHA1-PROTEINASE INHIBITOR 1000 MG/20ML IV SOLN: 5000.0000 mg | Freq: Once | INTRAVENOUS | Status: DC

## 2023-10-12 MED ORDER — EMPTY CONTAINERS FLEXIBLE MISC
5235.0000 mg | Freq: Once | Status: AC
  Administered 2023-10-12: 5235 mg via INTRAVENOUS
  Filled 2023-10-12: qty 5235

## 2023-10-19 ENCOUNTER — Ambulatory Visit
Admission: RE | Admit: 2023-10-19 | Discharge: 2023-10-19 | Disposition: A | Discharge status: Home / Self Care | Source: Ambulatory Visit | Attending: Student in an Organized Health Care Education/Training Program

## 2023-10-19 DIAGNOSIS — E8801 Alpha-1-antitrypsin deficiency: Secondary | ICD-10-CM | POA: Diagnosis present

## 2023-10-19 MED ORDER — ALPHA1-PROTEINASE INHIBITOR 1000 MG/20ML IV SOLN
5080.0000 mg | INTRAVENOUS | Status: DC
  Administered 2023-10-19: 5080 mg via INTRAVENOUS
  Filled 2023-10-19: qty 5080

## 2023-10-19 MED ORDER — ALPHA1-PROTEINASE INHIBITOR 1000 MG/20ML IV SOLN
5080.0000 mg | INTRAVENOUS | Status: DC
  Filled 2023-10-19: qty 6000

## 2023-10-26 ENCOUNTER — Ambulatory Visit
Admission: RE | Admit: 2023-10-26 | Discharge: 2023-10-26 | Disposition: A | Discharge status: Home / Self Care | Source: Ambulatory Visit | Attending: Student in an Organized Health Care Education/Training Program

## 2023-10-26 DIAGNOSIS — E8801 Alpha-1-antitrypsin deficiency: Secondary | ICD-10-CM | POA: Diagnosis present

## 2023-10-26 MED ORDER — ALPHA1-PROTEINASE INHIBITOR 1000 MG/20ML IV SOLN
5080.0000 mg | INTRAVENOUS | Status: DC
  Filled 2023-10-26 (×2): qty 6000

## 2023-10-26 MED ORDER — EMPTY CONTAINERS FLEXIBLE MISC
5080.0000 mg | Freq: Once | Status: AC
  Administered 2023-10-26: 5080 mg via INTRAVENOUS
  Filled 2023-10-26: qty 5080

## 2023-11-02 ENCOUNTER — Ambulatory Visit
Admission: RE | Admit: 2023-11-02 | Discharge: 2023-11-02 | Disposition: A | Discharge status: Home / Self Care | Source: Ambulatory Visit | Attending: Student in an Organized Health Care Education/Training Program | Admitting: Student in an Organized Health Care Education/Training Program

## 2023-11-02 DIAGNOSIS — E8801 Alpha-1-antitrypsin deficiency: Secondary | ICD-10-CM | POA: Insufficient documentation

## 2023-11-02 MED ORDER — EMPTY CONTAINERS FLEXIBLE MISC
5080.0000 mg | Status: DC
  Administered 2023-11-02: 5080 mg via INTRAVENOUS
  Filled 2023-11-02: qty 5080

## 2023-11-02 MED ORDER — EMPTY CONTAINERS FLEXIBLE MISC: 5000.0000 mg | Freq: Once | Status: DC

## 2023-11-09 ENCOUNTER — Ambulatory Visit
Admission: RE | Admit: 2023-11-09 | Discharge: 2023-11-09 | Disposition: A | Discharge status: Home / Self Care | Source: Ambulatory Visit | Attending: Student in an Organized Health Care Education/Training Program | Admitting: Student in an Organized Health Care Education/Training Program

## 2023-11-09 DIAGNOSIS — E8801 Alpha-1-antitrypsin deficiency: Secondary | ICD-10-CM | POA: Diagnosis present

## 2023-11-09 MED ORDER — ALPHA1-PROTEINASE INHIBITOR 1000 MG/20ML IV SOLN: 60.0000 mg/kg | Freq: Once | INTRAVENOUS | Status: DC

## 2023-11-09 MED ORDER — EMPTY CONTAINERS FLEXIBLE MISC
5255.0000 mg | Status: DC
  Administered 2023-11-09: 5255 mg via INTRAVENOUS
  Filled 2023-11-09: qty 5255

## 2023-11-16 ENCOUNTER — Ambulatory Visit
Admission: RE | Admit: 2023-11-16 | Discharge: 2023-11-16 | Disposition: A | Discharge status: Home / Self Care | Source: Ambulatory Visit | Attending: Student in an Organized Health Care Education/Training Program | Admitting: Student in an Organized Health Care Education/Training Program

## 2023-11-16 DIAGNOSIS — E8801 Alpha-1-antitrypsin deficiency: Secondary | ICD-10-CM | POA: Insufficient documentation

## 2023-11-16 MED ORDER — EMPTY CONTAINERS FLEXIBLE MISC
5255.0000 mg | Status: DC
  Administered 2023-11-16: 5255 mg via INTRAVENOUS
  Filled 2023-11-16: qty 5255

## 2023-11-16 MED ORDER — ALPHA1-PROTEINASE INHIBITOR 1000 MG/20ML IV SOLN: 5000.0000 mg | INTRAVENOUS | Status: DC

## 2023-11-23 ENCOUNTER — Ambulatory Visit
Admission: RE | Admit: 2023-11-23 | Discharge: 2023-11-23 | Disposition: A | Discharge status: Home / Self Care | Source: Ambulatory Visit | Attending: Student in an Organized Health Care Education/Training Program | Admitting: Student in an Organized Health Care Education/Training Program

## 2023-11-23 DIAGNOSIS — E8801 Alpha-1-antitrypsin deficiency: Secondary | ICD-10-CM | POA: Diagnosis present

## 2023-11-23 MED ORDER — EMPTY CONTAINERS FLEXIBLE MISC
5135.0000 mg | Status: DC
  Administered 2023-11-23: 5135 mg via INTRAVENOUS
  Filled 2023-11-23: qty 5135

## 2023-11-23 MED ORDER — ALPHA1-PROTEINASE INHIBITOR 1000 MG/20ML IV SOLN
5000.0000 mg | INTRAVENOUS | Status: DC
Start: 2023-11-23 — End: 2023-11-23

## 2023-11-30 ENCOUNTER — Ambulatory Visit
Admission: RE | Admit: 2023-11-30 | Discharge: 2023-11-30 | Disposition: A | Discharge status: Home / Self Care | Source: Ambulatory Visit | Attending: Student in an Organized Health Care Education/Training Program | Admitting: Student in an Organized Health Care Education/Training Program

## 2023-11-30 MED ORDER — EMPTY CONTAINERS FLEXIBLE MISC: 5135.0000 mg | Status: DC

## 2023-12-07 ENCOUNTER — Ambulatory Visit
Admission: RE | Admit: 2023-12-07 | Discharge: 2023-12-07 | Disposition: A | Discharge status: Home / Self Care | Source: Ambulatory Visit | Attending: Student in an Organized Health Care Education/Training Program | Admitting: Student in an Organized Health Care Education/Training Program

## 2023-12-07 DIAGNOSIS — E8801 Alpha-1-antitrypsin deficiency: Secondary | ICD-10-CM | POA: Diagnosis present

## 2023-12-07 MED ORDER — EMPTY CONTAINERS FLEXIBLE MISC
5146.0000 mg | Status: DC
  Administered 2023-12-07: 5146 mg via INTRAVENOUS
  Filled 2023-12-07: qty 4108

## 2023-12-07 MED ORDER — ALPHA1-PROTEINASE INHIBITOR 1000 MG/20ML IV SOLN: 60.0000 mg/kg | INTRAVENOUS | Status: DC

## 2023-12-10 ENCOUNTER — Ambulatory Visit

## 2023-12-14 ENCOUNTER — Ambulatory Visit
Admission: RE | Admit: 2023-12-14 | Discharge: 2023-12-14 | Disposition: A | Discharge status: Home / Self Care | Source: Ambulatory Visit

## 2023-12-14 ENCOUNTER — Encounter

## 2023-12-14 DIAGNOSIS — E8801 Alpha-1-antitrypsin deficiency: Secondary | ICD-10-CM | POA: Insufficient documentation

## 2023-12-14 DIAGNOSIS — Z7969 Long term (current) use of other immunomodulators and immunosuppressants: Secondary | ICD-10-CM | POA: Diagnosis not present

## 2023-12-14 MED ORDER — ALPHA1-PROTEINASE INHIBITOR 1000 MG/20ML IV SOLN: 60.0000 mg/kg | Freq: Once | INTRAVENOUS | Status: DC

## 2023-12-14 MED ORDER — EMPTY CONTAINERS FLEXIBLE MISC
5190.0000 mg | Freq: Once | Status: AC
  Administered 2023-12-14: 5190 mg via INTRAVENOUS
  Filled 2023-12-14: qty 5190

## 2023-12-21 ENCOUNTER — Ambulatory Visit: Admission: RE | Admit: 2023-12-21 | Source: Ambulatory Visit

## 2023-12-22 ENCOUNTER — Ambulatory Visit
Admission: RE | Admit: 2023-12-22 | Discharge: 2023-12-22 | Disposition: A | Discharge status: Home / Self Care | Source: Ambulatory Visit | Attending: Student in an Organized Health Care Education/Training Program | Admitting: Student in an Organized Health Care Education/Training Program

## 2023-12-22 DIAGNOSIS — E8801 Alpha-1-antitrypsin deficiency: Secondary | ICD-10-CM | POA: Insufficient documentation

## 2023-12-22 MED ORDER — EMPTY CONTAINERS FLEXIBLE MISC
5190.0000 mg | Status: DC
  Filled 2023-12-22: qty 5190

## 2023-12-22 MED ORDER — ALPHA1-PROTEINASE INHIBITOR 1000 MG/20ML IV SOLN
5190.0000 mg | INTRAVENOUS | Status: DC
  Administered 2023-12-22: 5190 mg via INTRAVENOUS
  Filled 2023-12-22: qty 5190

## 2023-12-22 MED ORDER — ALPHA1-PROTEINASE INHIBITOR 1000 MG/20ML IV SOLN
5190.0000 mg | INTRAVENOUS | Status: DC
  Filled 2023-12-22: qty 6000

## 2023-12-28 ENCOUNTER — Ambulatory Visit
Admission: RE | Admit: 2023-12-28 | Discharge: 2023-12-28 | Disposition: A | Discharge status: Home / Self Care | Source: Ambulatory Visit | Attending: Student in an Organized Health Care Education/Training Program | Admitting: Student in an Organized Health Care Education/Training Program

## 2023-12-28 DIAGNOSIS — E8801 Alpha-1-antitrypsin deficiency: Secondary | ICD-10-CM | POA: Insufficient documentation

## 2023-12-28 MED ORDER — ALPHA1-PROTEINASE INHIBITOR 1000 MG/20ML IV SOLN: 60.0000 mg/kg | INTRAVENOUS | Status: DC

## 2023-12-28 MED ORDER — EMPTY CONTAINERS FLEXIBLE MISC
5190.0000 mg | Status: DC
  Administered 2023-12-28: 5190 mg via INTRAVENOUS
  Filled 2023-12-28: qty 5190

## 2024-01-04 ENCOUNTER — Ambulatory Visit
Admission: RE | Admit: 2024-01-04 | Discharge: 2024-01-04 | Disposition: A | Discharge status: Home / Self Care | Source: Ambulatory Visit | Attending: Student in an Organized Health Care Education/Training Program | Admitting: Student in an Organized Health Care Education/Training Program

## 2024-01-04 DIAGNOSIS — Z7969 Long term (current) use of other immunomodulators and immunosuppressants: Secondary | ICD-10-CM | POA: Insufficient documentation

## 2024-01-04 DIAGNOSIS — E8801 Alpha-1-antitrypsin deficiency: Secondary | ICD-10-CM | POA: Diagnosis present

## 2024-01-04 MED ORDER — ALPHA1-PROTEINASE INHIBITOR 1000 MG/20ML IV SOLN: 60.0000 mg/kg | Freq: Once | INTRAVENOUS | Status: DC

## 2024-01-04 MED ORDER — EMPTY CONTAINERS FLEXIBLE MISC
5190.0000 mg | Status: DC
  Administered 2024-01-04: 5190 mg via INTRAVENOUS
  Filled 2024-01-04: qty 5190

## 2024-01-11 ENCOUNTER — Ambulatory Visit
Admission: RE | Admit: 2024-01-11 | Discharge: 2024-01-11 | Disposition: A | Discharge status: Home / Self Care | Source: Ambulatory Visit | Attending: Student in an Organized Health Care Education/Training Program

## 2024-01-11 DIAGNOSIS — E8801 Alpha-1-antitrypsin deficiency: Secondary | ICD-10-CM | POA: Insufficient documentation

## 2024-01-11 MED ORDER — ALPHA1-PROTEINASE INHIBITOR 1000 MG/20ML IV SOLN: 60.0000 mg/kg | INTRAVENOUS | Status: DC

## 2024-01-11 MED ORDER — EMPTY CONTAINERS FLEXIBLE MISC
5190.0000 mg | Freq: Once | Status: AC
  Administered 2024-01-11: 5190 mg via INTRAVENOUS
  Filled 2024-01-11: qty 5190

## 2024-01-18 ENCOUNTER — Ambulatory Visit
Admission: RE | Admit: 2024-01-18 | Discharge: 2024-01-18 | Disposition: A | Discharge status: Home / Self Care | Source: Ambulatory Visit | Attending: Student in an Organized Health Care Education/Training Program | Admitting: Student in an Organized Health Care Education/Training Program

## 2024-01-18 DIAGNOSIS — Z7969 Long term (current) use of other immunomodulators and immunosuppressants: Secondary | ICD-10-CM | POA: Diagnosis not present

## 2024-01-18 DIAGNOSIS — E8801 Alpha-1-antitrypsin deficiency: Secondary | ICD-10-CM | POA: Diagnosis present

## 2024-01-18 MED ORDER — ALPHA1-PROTEINASE INHIBITOR 1000 MG/20ML IV SOLN: 60.0000 mg/kg | INTRAVENOUS | Status: DC

## 2024-01-18 MED ORDER — EMPTY CONTAINERS FLEXIBLE MISC
5190.0000 mg | Status: DC
  Administered 2024-01-18: 5190 mg via INTRAVENOUS
  Filled 2024-01-18: qty 5190

## 2024-01-25 ENCOUNTER — Ambulatory Visit
Admission: RE | Admit: 2024-01-25 | Discharge: 2024-01-25 | Disposition: A | Discharge status: Home / Self Care | Source: Ambulatory Visit | Attending: Student in an Organized Health Care Education/Training Program | Admitting: Student in an Organized Health Care Education/Training Program

## 2024-01-25 DIAGNOSIS — E8801 Alpha-1-antitrypsin deficiency: Secondary | ICD-10-CM | POA: Insufficient documentation

## 2024-01-25 DIAGNOSIS — Z79620 Long term (current) use of immunosuppressive biologic: Secondary | ICD-10-CM | POA: Insufficient documentation

## 2024-01-25 MED ORDER — ALPHA1-PROTEINASE INHIBITOR 1000 MG/20ML IV SOLN
5190.0000 mg | INTRAVENOUS | Status: DC
  Administered 2024-01-25: 5190 mg via INTRAVENOUS
  Filled 2024-01-25: qty 5190

## 2024-01-28 NOTE — Progress Notes (Signed)
 [facility] [address] [place], [place] [address]  Pulmonary Sleep Medicine   Office Visit Note  Patient Name: Billy Freeman. DOB: [birth year] MRN [id number]    Chief Complaint: Obstructive Sleep Apnea visit  Brief History:  Billy Freeman is seen today for an annual follow up visit for BiPAP ASV@ EPAP 5, PS min 3 max 15 cmH2O. The patient has a 19 year history of sleep apnea. Patient is using PAP nightly.  The patient feels rested after sleeping with PAP.  The patient reports benefiting from PAP use. Reported sleepiness is  improved and the Epworth Sleepiness Score is 4 out of 24. The patient will occasionally take naps. The patient complains of the following: pt is in need of new supplies.  The compliance download shows 100% compliance with an average use time of 8 hours 47 minutes. The AHI is 0.7.  The patient does not complain of limb movements disrupting sleep. The patient continues to require PAP therapy in order to eliminate sleep apnea. Machine is at end of life, out of warranty, and requires replacement.   ROS  General: (-) fever, (-) chills, (-) night sweat Nose and Sinuses: (-) nasal stuffiness or itchiness, (-) postnasal drip, (-) nosebleeds, (-) sinus trouble. Mouth and Throat: (-) sore throat, (-) hoarseness. Neck: (-) swollen glands, (-) enlarged thyroid, (-) neck pain. Respiratory: + cough, - shortness of breath, - wheezing. Neurologic: - numbness, - tingling. Psychiatric: - anxiety, - depression   Current Medication: Outpatient Encounter Medications as of 02/01/2024  Medication Sig Note   Abaloparatide (TYMLOS) 3120 MCG/1.56ML SOPN Inject 1 Insert into the skin daily.    albuterol  (VENTOLIN  HFA) 108 (90 Base) MCG/ACT inhaler Inhale 1-2 puffs into the lungs every 6 (six) hours as needed.    allopurinol (ZYLOPRIM) 100 MG tablet Take 1 tablet by mouth daily.    apixaban  (ELIQUIS ) 5 MG TABS tablet Take 1 tablet (5 mg total) by mouth 2 (two) times daily. Take 10  mg (two pills) twice daily for seven days, then take 5mg  (one pill) twice daily. (Patient taking differently: Take 5 mg by mouth 2 (two) times daily.) 04/21/2022: Will be temp. Stopping for an weds. Oct. 6 for cath    Cholecalciferol (VITAMIN D) 50 MCG (2000 UT) tablet Take 4,000 Units by mouth daily.    citalopram  (CELEXA ) 20 MG tablet Take 20 mg by mouth every morning.     cyclobenzaprine (FLEXERIL) 5 MG tablet Take 5 mg by mouth at bedtime as needed.    Omega-3 Fatty Acids (FISH OIL) 1200 MG CAPS Take 1,200 mg by mouth 2 (two) times daily.     propranolol  (INDERAL ) 10 MG tablet Take 1 tablet by mouth daily.    temazepam  (RESTORIL ) 15 MG capsule Take 15 mg by mouth at bedtime.     Tiotropium Bromide-Olodaterol (STIOLTO RESPIMAT) 2.5-2.5 MCG/ACT AERS Inhale 2 each into the lungs every morning.    vitamin B-12 (CYANOCOBALAMIN) 500 MCG tablet Take 500 mcg by mouth daily.    [DISCONTINUED] dofetilide (TIKOSYN) 500 MCG capsule Take 500 mcg by mouth 2 (two) times daily. (Patient not taking: Reported on 10/26/2023)    No facility-administered encounter medications on file as of 02/01/2024.    Surgical History: Past Surgical History:  Procedure Laterality Date   ANTERIOR CRUCIATE LIGAMENT (ACL) REVISION     broken nose surgery     BRONCHOSCOPY     BUNIONECTOMY Bilateral    CARDIAC ELECTROPHYSIOLOGY MAPPING AND ABLATION     CATARACT EXTRACTION  W/PHACO Right 02/09/2018   Procedure: CATARACT EXTRACTION PHACO AND INTRAOCULAR LENS PLACEMENT (IOC);  Surgeon: Jaye Fallow, MD;  Location: [facility];  Service: Ophthalmology;  Laterality: Right;  US  00:45 AP% 14.4 CDE 6.58 Fluid pack lot # [id number]   CATARACT EXTRACTION W/PHACO Left 12/14/2018   Procedure: CATARACT EXTRACTION PHACO AND INTRAOCULAR LENS PLACEMENT (IOC LEFT;  Surgeon: Jaye Fallow, MD;  Location: [facility];  Service: Ophthalmology;  Laterality: Left;   CHOLECYSTECTOMY     COLONOSCOPY N/A 09/16/2021   Procedure:  COLONOSCOPY;  Surgeon: Onita Elspeth Sharper, DO;  Location: [facility]COPY;  Service: Gastroenterology;  Laterality: N/A;   COLONOSCOPY     2023   COLONOSCOPY WITH PROPOFOL  N/A 04/09/2023   Procedure: COLONOSCOPY WITH PROPOFOL ;  Surgeon: Onita Elspeth Sharper, DO;  Location: [facility] END[facility]COPY;  Service: Gastroenterology;  Laterality: N/A;   ESOPHAGOGASTRODUODENOSCOPY N/A 09/16/2021   Procedure: ESOPHAGOGASTRODUODENOSCOPY (EGD);  Surgeon: Onita Elspeth Sharper, DO;  Location: [facility] ENDOSCOPY;  Service: Gastroenterology;  Laterality: N/A;   EYE SURGERY     FLEXIBLE BRONCHOSCOPY N/A 04/30/2017   Procedure: FLEXIBLE BRONCHOSCOPY;  Surgeon: Verdia Art, MD;  Location: [facility];  Service: Pulmonary;  Laterality: N/A;   KIDNEY STONE SURGERY     LIVER BIOPSY     MENISCUS REPAIR Bilateral    right knee x 3, left knee x 2   POLYPECTOMY  04/09/2023   Procedure: POLYPECTOMY;  Surgeon: Onita Elspeth Sharper, DO;  Location: [facility] ENDOSCOPY;  Service: Gastroenterology;;   RIGHT HEART CATH N/A 12/31/2021   Procedure: RIGHT HEART CATH;  Surgeon: Ammon Blunt, MD;  Location: [facility];  Service: Cardiovascular;  Laterality: N/A;   SHOULDER ARTHROSCOPY WITH ROTATOR CUFF REPAIR AND SUBACROMIAL DECOMPRESSION Right 02/01/2019   Procedure: Extensive arthroscopic debridement, arthroscopic subacromial decompression, mini-open rotator cuff repair supplemented by a [employer] Regeneten patch, and mini-open biceps tenodesis, right shoulder.;  Surgeon: Edie Norleen PARAS, MD;  Location: [facility];  Service: Orthopedics;  Laterality: Right;   TOE AMPUTATION Bilateral    index toes    Medical History: Past Medical History:  Diagnosis Date   Atrial fibrillation (HCC)    Basal cell carcinoma    Cancer (HCC)    SKIN   Cirrhosis (HCC)    POSSIBLE   COPD (chronic obstructive pulmonary disease) (HCC)    Depression    Diverticulosis    Dysrhythmia    a-fib   Edema    right FEET/LEG   Fatty  liver    GERD (gastroesophageal reflux disease)    h/o   Glaucoma (increased eye pressure)    Gout    Heart murmur    History of alpha-1-antitrypsin deficiency    History of kidney stones    HOH (hard of hearing)    Hypertension    Pneumonia    January/February 2020   Pulmonary embolism (HCC)    Pulmonary nodules    Reflux    Rosacea    Sleep apnea    cpap   Tibial fracture 12/21/2021   Tremors of nervous system    Wears hearing aid     Family History: Non contributory to the present illness  Social History: Social History   Socioeconomic History   Marital status: Married    Spouse name: Not on file   Number of children: Not on file   Years of education: Not on file   Highest education level: Not on file  Occupational History   Not on file  Tobacco Use  Smoking status: Never   Smokeless tobacco: Never  Vaping Use   Vaping status: Never Used  Substance and Sexual Activity   Alcohol use: Not Currently    Alcohol/week: 5.0 standard drinks of alcohol    Types: 5 Glasses of wine per week   Drug use: Not Currently    Types: Marijuana    Comment: Friday 09/13/20 Edibles   Sexual activity: Yes  Other Topics Concern   Not on file  Social History Narrative   Not on file   Social Drivers of Health   Financial Resource Strain: Low Risk  (01/15/2024)   Received from [facility]   Overall Financial Resource Strain (CARDIA)    Difficulty of Paying Living Expenses: Not hard at all  Food Insecurity: No Food Insecurity (01/15/2024)   Received from [facility]   Hunger Vital Sign    Within the past 12 months, you worried that your food would run out before you got the money to buy more.: Never true    Within the past 12 months, the food you bought just didn't last and you didn't have money to get more.: Never true  Transportation Needs: No Transportation Needs (01/15/2024)   Received from [facility] -  Transportation    In the past 12 months, has lack of transportation kept you from medical appointments or from getting medications?: No    Lack of Transportation (Non-Medical): No  Physical Activity: Not on file  Stress: Not on file  Social Connections: Not on file  Intimate Partner Violence: Not At Risk (04/22/2022)   Humiliation, Afraid, Rape, and Kick questionnaire    Fear of Current or Ex-Partner: No    Emotionally Abused: No    Physically Abused: No    Sexually Abused: No    Vital Signs: Blood pressure 119/73, pulse 73, resp. rate 16, height 6' 4 (1.93 m), weight 210 lb (95.3 kg), SpO2 93%. Body mass index is 25.56 kg/m.    Examination: General Appearance: The patient is well-developed, well-nourished, and in no distress. Neck Circumference: 42 cm Skin: Gross inspection of skin unremarkable. Head: normocephalic, no gross deformities. Eyes: no gross deformities noted. ENT: ears appear grossly normal Neurologic: Alert and oriented. No involuntary movements.  STOP BANG RISK ASSESSMENT S (snore) Have you been told that you snore?     NO   T (tired) Are you often tired, fatigued, or sleepy during the day?   NO  O (obstruction) Do you stop breathing, choke, or gasp during sleep? NO   P (pressure) Do you have or are you being treated for high blood pressure? NO   B (BMI) Is your body index greater than 35 kg/m? NO   A (age) Are you 81 years old or older? YES   N (neck) Do you have a neck circumference greater than 16 inches?   YES  G (gender) Are you a male? YES   TOTAL STOP/BANG "YES" ANSWERS 3       A STOP-Bang score of 2 or less is considered low risk, and a score of 5 or more is high risk for having either moderate or severe OSA. For people who score 3 or 4, doctors may need to perform further assessment to determine how likely they are to have OSA.         EPWORTH SLEEPINESS SCALE:  Scale:  (0)= no chance of dozing; (1)= slight chance of dozing; (2)= moderate  chance of dozing; (  3)= high chance of dozing  Chance  Situtation    Sitting and reading: 1    Watching TV: 1    Sitting Inactive in public: 0    As a passenger in car: 1      Lying down to rest: 1    Sitting and talking: 0    Sitting quielty after lunch: 0    In a car, stopped in traffic: 0   TOTAL SCORE:   4 out of 24    SLEEP STUDIES:  PSG (01/22/05)  AHI 17, supine AHI 22, min SPO2 84% TITRATION (06/16/18)  needs ASV titration TITRATION (07/08/18)  recommended BIPAP ASV at min EPAP 5, max EPAP 15, min PS 3   CPAP COMPLIANCE DATA:  Date Range: 01/28/2023-01/27/2024  Average Daily Use: 8 hours 47 minutes  Median Use: 8 hours 49 minutes  Compliance for > 4 Hours: 100%  AHI: 0.7 respiratory events per hour  Days Used: 364/365 days  Mask Leak: 20.5  95th Percentile Pressure: 14.6         LABS: No results found for this or any previous visit (from the past 2160 hours).  Radiology: No results found.  No results found.  No results found.    Assessment and Plan: Patient Active Problem List   Diagnosis Date Noted   Fitting and adjustment of hearing aid 07/15/2023   Subjective tinnitus 07/15/2023   Sensorineural hearing loss, bilateral 02/02/2023   S/P ablation of atrial fibrillation 09/01/2022   Preoperative evaluation to rule out surgical contraindication 08/07/2022   On dofetilide therapy 07/07/2022   NSVT (nonsustained ventricular tachycardia) (HCC) 05/26/2022   Leg wound, right 05/26/2022   Alpha-1-antitrypsin deficiency (HCC) 05/19/2022   HFrEF (heart failure with reduced ejection fraction) (HCC) 05/19/2022   Lower GI bleed 04/22/2022   Hematochezia 04/21/2022   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   Pulmonary hypertension, mild (HCC) 01/09/2022   Fall 12/22/2021   Chest pain with high risk for cardiac etiology 11/18/2021   SOB (shortness of breath) on exertion 11/18/2021   OSA treated with BiPAP 12/31/2020   Hypertension  12/31/2020   Overweight (BMI 25.0-29.9) 12/31/2020   Aortic atherosclerosis (HCC) 05/03/2020   S/P laparoscopic cholecystectomy 12/21/2019   Senile osteoporosis 03/25/2019   Degenerative tear of glenoid labrum of right shoulder 02/03/2019   Rotator cuff tendinitis, right 01/07/2019   Tendinitis of upper biceps tendon of right shoulder 01/07/2019   Traumatic complete tear of right rotator cuff 01/07/2019   Decreased left ventricular function 01/03/2019   Senile hyperkeratosis 01/03/2019   Atrial fibrillation (HCC) 10/05/2018   Longstanding persistent atrial fibrillation (HCC) 10/05/2018   Bilateral lower extremity edema 01/19/2018   History of pulmonary embolism 07/14/2017   Personal history of gout 07/14/2017   Closed compression fracture of L1 vertebra (HCC) 07/15/2016   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   History of urinary stone 02/26/2015   Erectile dysfunction 06/07/2014   Hyperlipidemia 06/06/2014   COPD (chronic obstructive pulmonary disease) (HCC) 01/02/2014   Clinical depression 01/02/2014   Intermittent tremor 01/02/2014   Obstructive apnea 01/02/2014   Acne erythematosa 01/02/2014   Calculus of kidney 11/23/2013   Renal colic 11/23/2013   Calculi, ureter 11/23/2013   1. Complex sleep apnea syndrome (Primary) The patient does tolerate PAP and reports  benefit from PAP use. His machine is in need of replacement. His echo needs to be updated (last was in 2023 with 53% lvef)  so he will ask his cardiologist to order this. The patient was reminded  how to clean equipment and advised to replace supplies routinely. The patient was also counselled on weight loss. The compliance is excellent. The AHI is 0.7.   OSA on cpap- controlled. Replace machine. Continue with excellent compliance with pap. CPAP continues to be medically necessary to treat this patient's OSA. F/u after replacement.   2. Chronic atrial fibrillation (HCC) Controlled post ablation.   3. Hypertension, unspecified  type Controlled.     General Counseling: I have discussed the findings of the evaluation and examination with Clem.  I have also discussed any further diagnostic evaluation thatmay be needed or ordered today. Brenda verbalizes understanding of the findings of todays visit. We also reviewed his medications today and discussed drug interactions and side effects including but not limited excessive drowsiness and altered mental states. We also discussed that there is always a risk not just to him but also people around him. he has been encouraged to call the office with any questions or concerns that should arise related to todays visit.  No orders of the defined types were placed in this encounter.       I have personally obtained a history, examined the patient, evaluated laboratory and imaging results, formulated the assessment and plan and placed orders.  Elfreda DELENA Bathe, MD [facility] Diplomate ABMS Pulmonary Critical Care Medicine and Sleep Medicine

## 2024-02-01 ENCOUNTER — Ambulatory Visit (INDEPENDENT_AMBULATORY_CARE_PROVIDER_SITE_OTHER): Admitting: Internal Medicine

## 2024-02-01 ENCOUNTER — Ambulatory Visit
Admission: RE | Admit: 2024-02-01 | Discharge: 2024-02-01 | Disposition: A | Discharge status: Home / Self Care | Source: Ambulatory Visit | Attending: Student in an Organized Health Care Education/Training Program | Admitting: Student in an Organized Health Care Education/Training Program

## 2024-02-01 VITALS — BP 119/73 | HR 73 | Resp 16 | Ht 76.0 in | Wt 210.0 lb

## 2024-02-01 DIAGNOSIS — I482 Chronic atrial fibrillation, unspecified: Secondary | ICD-10-CM | POA: Diagnosis not present

## 2024-02-01 DIAGNOSIS — I10 Essential (primary) hypertension: Secondary | ICD-10-CM | POA: Diagnosis not present

## 2024-02-01 DIAGNOSIS — G4739 Other sleep apnea: Secondary | ICD-10-CM

## 2024-02-01 DIAGNOSIS — E8801 Alpha-1-antitrypsin deficiency: Secondary | ICD-10-CM | POA: Diagnosis present

## 2024-02-01 MED ORDER — EMPTY CONTAINERS FLEXIBLE MISC
5060.0000 mg | Freq: Once | Status: AC
  Administered 2024-02-01: 5060 mg via INTRAVENOUS
  Filled 2024-02-01: qty 5060

## 2024-02-01 MED ORDER — ALPHA1-PROTEINASE INHIBITOR 1000 MG/20ML IV SOLN: 5190.0000 mg | INTRAVENOUS | Status: DC

## 2024-02-01 NOTE — Patient Instructions (Signed)

## 2024-02-08 ENCOUNTER — Ambulatory Visit
Admission: RE | Admit: 2024-02-08 | Discharge: 2024-02-08 | Disposition: A | Discharge status: Home / Self Care | Source: Ambulatory Visit | Attending: Internal Medicine | Admitting: Internal Medicine

## 2024-02-08 DIAGNOSIS — E8801 Alpha-1-antitrypsin deficiency: Secondary | ICD-10-CM | POA: Insufficient documentation

## 2024-02-08 MED ORDER — EMPTY CONTAINERS FLEXIBLE MISC
5060.0000 mg | Freq: Once | Status: AC
  Administered 2024-02-08: 5060 mg via INTRAVENOUS
  Filled 2024-02-08: qty 5060

## 2024-02-14 MED ORDER — ALPHA1-PROTEINASE INHIBITOR 1000 MG/20ML IV SOLN: 60.0000 mg/kg | INTRAVENOUS | Status: DC

## 2024-02-15 ENCOUNTER — Ambulatory Visit
Admission: RE | Admit: 2024-02-15 | Discharge: 2024-02-15 | Disposition: A | Discharge status: Home / Self Care | Source: Ambulatory Visit | Attending: Student in an Organized Health Care Education/Training Program | Admitting: Student in an Organized Health Care Education/Training Program

## 2024-02-15 ENCOUNTER — Other Ambulatory Visit: Payer: Self-pay | Admitting: Gastroenterology

## 2024-02-15 ENCOUNTER — Encounter: Payer: Self-pay | Admitting: Gastroenterology

## 2024-02-15 DIAGNOSIS — E8801 Alpha-1-antitrypsin deficiency: Secondary | ICD-10-CM | POA: Insufficient documentation

## 2024-02-15 DIAGNOSIS — K7460 Unspecified cirrhosis of liver: Secondary | ICD-10-CM

## 2024-02-15 MED ORDER — ALPHA1-PROTEINASE INHIBITOR 1000 MG/20ML IV SOLN
5868.0000 mg | INTRAVENOUS | Status: DC
  Administered 2024-02-15: 5868 mg via INTRAVENOUS
  Filled 2024-02-15: qty 6000
  Filled 2024-02-15: qty 5868

## 2024-02-15 MED ORDER — ALPHA1-PROTEINASE INHIBITOR 1000 MG/20ML IV SOLN
60.0000 mg/kg | INTRAVENOUS | Status: DC
  Filled 2024-02-15: qty 6000

## 2024-02-22 ENCOUNTER — Ambulatory Visit
Admission: RE | Admit: 2024-02-22 | Discharge: 2024-02-22 | Disposition: A | Discharge status: Home / Self Care | Source: Ambulatory Visit | Attending: Student in an Organized Health Care Education/Training Program | Admitting: Student in an Organized Health Care Education/Training Program

## 2024-02-22 DIAGNOSIS — E8801 Alpha-1-antitrypsin deficiency: Secondary | ICD-10-CM | POA: Insufficient documentation

## 2024-02-22 MED ORDER — EMPTY CONTAINERS FLEXIBLE MISC
5165.0000 mg | Status: DC
  Administered 2024-02-22: 5165 mg via INTRAVENOUS
  Filled 2024-02-22: qty 5165

## 2024-02-22 MED ORDER — ALPHA1-PROTEINASE INHIBITOR 1000 MG/20ML IV SOLN: 60.0000 mg/kg | INTRAVENOUS | Status: DC

## 2024-02-23 ENCOUNTER — Ambulatory Visit
Admission: RE | Admit: 2024-02-23 | Discharge: 2024-02-23 | Disposition: A | Discharge status: Home / Self Care | Source: Ambulatory Visit | Attending: Gastroenterology

## 2024-02-23 DIAGNOSIS — K7460 Unspecified cirrhosis of liver: Secondary | ICD-10-CM

## 2024-02-29 ENCOUNTER — Ambulatory Visit
Admission: RE | Admit: 2024-02-29 | Discharge: 2024-02-29 | Disposition: A | Discharge status: Home / Self Care | Source: Ambulatory Visit | Attending: Student in an Organized Health Care Education/Training Program | Admitting: Student in an Organized Health Care Education/Training Program

## 2024-02-29 DIAGNOSIS — Z01818 Encounter for other preprocedural examination: Secondary | ICD-10-CM | POA: Insufficient documentation

## 2024-02-29 MED ORDER — ALPHA1-PROTEINASE INHIBITOR 1000 MG/20ML IV SOLN: 60.0000 mg/kg | INTRAVENOUS | Status: DC

## 2024-02-29 MED ORDER — EMPTY CONTAINERS FLEXIBLE MISC
5165.0000 mg | Status: DC
  Administered 2024-02-29: 5165 mg via INTRAVENOUS
  Filled 2024-02-29: qty 5165

## 2024-02-29 MED ORDER — ALPHA1-PROTEINASE INHIBITOR 1000 MG/20ML IV SOLN: 5000.0000 mg | INTRAVENOUS | Status: DC

## 2024-03-07 ENCOUNTER — Ambulatory Visit
Admission: RE | Admit: 2024-03-07 | Discharge: 2024-03-07 | Disposition: A | Discharge status: Home / Self Care | Source: Ambulatory Visit | Attending: Student in an Organized Health Care Education/Training Program | Admitting: Student in an Organized Health Care Education/Training Program

## 2024-03-07 DIAGNOSIS — E8801 Alpha-1-antitrypsin deficiency: Secondary | ICD-10-CM | POA: Insufficient documentation

## 2024-03-07 MED ORDER — EMPTY CONTAINERS FLEXIBLE MISC
5165.0000 mg | Status: DC
  Administered 2024-03-07 (×2): 5165 mg via INTRAVENOUS
  Filled 2024-03-07: qty 5165

## 2024-03-07 MED ORDER — ALPHA1-PROTEINASE INHIBITOR 1000 MG/20ML IV SOLN: 5000.0000 mg | INTRAVENOUS | Status: DC

## 2024-03-14 ENCOUNTER — Ambulatory Visit
Admission: RE | Admit: 2024-03-14 | Discharge: 2024-03-14 | Disposition: A | Discharge status: Home / Self Care | Source: Ambulatory Visit | Attending: Student in an Organized Health Care Education/Training Program | Admitting: Student in an Organized Health Care Education/Training Program

## 2024-03-14 DIAGNOSIS — E8801 Alpha-1-antitrypsin deficiency: Secondary | ICD-10-CM | POA: Diagnosis present

## 2024-03-14 MED ORDER — ALPHA1-PROTEINASE INHIBITOR 1000 MG/20ML IV SOLN
5075.0000 mg | INTRAVENOUS | Status: DC
  Administered 2024-03-14: 5075 mg via INTRAVENOUS
  Filled 2024-03-14: qty 5075
  Filled 2024-03-14: qty 5000

## 2024-03-21 ENCOUNTER — Ambulatory Visit
Admission: RE | Admit: 2024-03-21 | Discharge: 2024-03-21 | Disposition: A | Discharge status: Home / Self Care | Source: Ambulatory Visit | Attending: Student in an Organized Health Care Education/Training Program

## 2024-03-21 DIAGNOSIS — E8801 Alpha-1-antitrypsin deficiency: Secondary | ICD-10-CM | POA: Insufficient documentation

## 2024-03-21 MED ORDER — EMPTY CONTAINERS FLEXIBLE MISC
5075.0000 mg | Status: DC
  Administered 2024-03-21: 5075 mg via INTRAVENOUS
  Filled 2024-03-21: qty 5075

## 2024-03-21 MED ORDER — ALPHA1-PROTEINASE INHIBITOR 1000 MG/20ML IV SOLN: 5000.0000 mg | INTRAVENOUS | Status: DC

## 2024-03-29 ENCOUNTER — Ambulatory Visit
Admission: RE | Admit: 2024-03-29 | Discharge: 2024-03-29 | Disposition: A | Discharge status: Home / Self Care | Source: Ambulatory Visit | Attending: Student in an Organized Health Care Education/Training Program | Admitting: Student in an Organized Health Care Education/Training Program

## 2024-03-29 DIAGNOSIS — E8801 Alpha-1-antitrypsin deficiency: Secondary | ICD-10-CM | POA: Insufficient documentation

## 2024-03-29 MED ORDER — ALPHA1-PROTEINASE INHIBITOR 1000 MG/20ML IV SOLN
5021.0000 mg | INTRAVENOUS | Status: DC
  Administered 2024-03-29: 5021 mg via INTRAVENOUS
  Filled 2024-03-29: qty 5021
  Filled 2024-03-29: qty 6000

## 2024-04-04 ENCOUNTER — Ambulatory Visit
Admission: RE | Admit: 2024-04-04 | Discharge: 2024-04-04 | Disposition: A | Discharge status: Home / Self Care | Source: Ambulatory Visit | Attending: Student in an Organized Health Care Education/Training Program | Admitting: Student in an Organized Health Care Education/Training Program

## 2024-04-04 DIAGNOSIS — E8801 Alpha-1-antitrypsin deficiency: Secondary | ICD-10-CM | POA: Diagnosis present

## 2024-04-04 MED ORDER — EMPTY CONTAINERS FLEXIBLE MISC
5105.0000 mg | Status: DC
  Administered 2024-04-04: 5105 mg via INTRAVENOUS
  Filled 2024-04-04: qty 5105

## 2024-04-04 MED ORDER — ALPHA1-PROTEINASE INHIBITOR 1000 MG/20ML IV SOLN
5105.0000 mg | INTRAVENOUS | Status: DC
  Filled 2024-04-04: qty 6000

## 2024-04-05 ENCOUNTER — Telehealth: Payer: Self-pay | Admitting: Pharmacy Technician

## 2024-04-05 NOTE — Telephone Encounter (Signed)
 Auth Submission: NO AUTH NEEDED Site of care: Site of care: [facility] Payer: MEDICARE A/B & BCBS Medication & CPT/J Code(s) submitted: PROLASTIN Diagnosis Code: E88.01 Route of submission (phone, fax, portal):  Phone # Fax # Auth type: Buy/Bill HB Units/visits requested: 5000MG  WKLY Reference number:  Approval from: 04/05/24 to 08/27/24

## 2024-04-11 ENCOUNTER — Ambulatory Visit
Admission: RE | Admit: 2024-04-11 | Discharge: 2024-04-11 | Disposition: A | Discharge status: Home / Self Care | Source: Ambulatory Visit | Attending: Student in an Organized Health Care Education/Training Program | Admitting: Student in an Organized Health Care Education/Training Program

## 2024-04-11 ENCOUNTER — Ambulatory Visit: Admission: RE | Admit: 2024-04-11 | Source: Ambulatory Visit

## 2024-04-11 VITALS — BP 99/62 | HR 68 | Temp 98.2°F | Resp 18 | Ht 76.0 in | Wt 202.0 lb

## 2024-04-11 DIAGNOSIS — Z79899 Other long term (current) drug therapy: Secondary | ICD-10-CM | POA: Diagnosis not present

## 2024-04-11 DIAGNOSIS — E8801 Alpha-1-antitrypsin deficiency: Secondary | ICD-10-CM | POA: Insufficient documentation

## 2024-04-11 MED ORDER — EPINEPHRINE 0.3 MG/0.3ML IJ SOAJ: 0.3000 mg | Freq: Once | INTRAMUSCULAR | Status: DC | PRN

## 2024-04-11 MED ORDER — SODIUM CHLORIDE 0.9 % IV SOLN: Freq: Once | INTRAVENOUS | Status: DC | PRN

## 2024-04-11 MED ORDER — SODIUM CHLORIDE 0.9% FLUSH: 10.0000 mL | Freq: Once | INTRAVENOUS | Status: DC | PRN

## 2024-04-11 MED ORDER — ANTICOAGULANT SODIUM CITRATE 4% (200MG/5ML) IV SOLN: 5.0000 mL | Freq: Once | Status: DC | PRN

## 2024-04-11 MED ORDER — HEPARIN SOD (PORK) LOCK FLUSH 100 UNIT/ML IV SOLN: 500.0000 [IU] | Freq: Once | INTRAVENOUS | Status: DC | PRN

## 2024-04-11 MED ORDER — FAMOTIDINE IN NACL 20-0.9 MG/50ML-% IV SOLN: 20.0000 mg | Freq: Once | INTRAVENOUS | Status: DC | PRN

## 2024-04-11 MED ORDER — ALBUTEROL SULFATE HFA 108 (90 BASE) MCG/ACT IN AERS: 2.0000 | INHALATION_SPRAY | Freq: Once | RESPIRATORY_TRACT | Status: DC | PRN

## 2024-04-11 MED ORDER — DIPHENHYDRAMINE HCL 50 MG/ML IJ SOLN: 50.0000 mg | Freq: Once | INTRAMUSCULAR | Status: DC | PRN

## 2024-04-11 MED ORDER — ALTEPLASE 2 MG IJ SOLR: 2.0000 mg | Freq: Once | INTRAMUSCULAR | Status: DC | PRN

## 2024-04-11 MED ORDER — METHYLPREDNISOLONE SODIUM SUCC 125 MG IJ SOLR: 125.0000 mg | Freq: Once | INTRAMUSCULAR | Status: DC | PRN

## 2024-04-11 MED ORDER — EMPTY CONTAINERS FLEXIBLE MISC
5105.0000 mg | Freq: Once | Status: AC
  Administered 2024-04-11: 5105 mg via INTRAVENOUS
  Filled 2024-04-11: qty 5105

## 2024-04-11 MED ORDER — HEPARIN SOD (PORK) LOCK FLUSH 100 UNIT/ML IV SOLN: 250.0000 [IU] | Freq: Once | INTRAVENOUS | Status: DC | PRN

## 2024-04-11 MED ORDER — SODIUM CHLORIDE 0.9% FLUSH: 3.0000 mL | Freq: Once | INTRAVENOUS | Status: DC | PRN

## 2024-04-18 ENCOUNTER — Ambulatory Visit
Admission: RE | Admit: 2024-04-18 | Discharge: 2024-04-18 | Disposition: A | Discharge status: Home / Self Care | Source: Ambulatory Visit | Attending: Student in an Organized Health Care Education/Training Program | Admitting: Student in an Organized Health Care Education/Training Program

## 2024-04-18 VITALS — BP 102/69 | HR 83 | Temp 97.2°F | Resp 16 | Ht 76.0 in | Wt 201.9 lb

## 2024-04-18 DIAGNOSIS — E8801 Alpha-1-antitrypsin deficiency: Secondary | ICD-10-CM | POA: Diagnosis not present

## 2024-04-18 MED ORDER — EMPTY CONTAINERS FLEXIBLE MISC
5130.0000 mg | Freq: Once | Status: AC
  Administered 2024-04-18: 5130 mg via INTRAVENOUS
  Filled 2024-04-18: qty 5130

## 2024-04-25 ENCOUNTER — Ambulatory Visit
Admission: RE | Admit: 2024-04-25 | Discharge: 2024-04-25 | Disposition: A | Discharge status: Home / Self Care | Source: Ambulatory Visit | Attending: Student in an Organized Health Care Education/Training Program | Admitting: Student in an Organized Health Care Education/Training Program

## 2024-04-25 VITALS — BP 107/73 | HR 65 | Temp 97.0°F | Resp 16 | Ht 76.0 in | Wt 201.9 lb

## 2024-04-25 DIAGNOSIS — E8801 Alpha-1-antitrypsin deficiency: Secondary | ICD-10-CM | POA: Insufficient documentation

## 2024-04-25 MED ORDER — DIPHENHYDRAMINE HCL 50 MG/ML IJ SOLN: 50.0000 mg | Freq: Once | INTRAMUSCULAR | Status: DC | PRN

## 2024-04-25 MED ORDER — ANTICOAGULANT SODIUM CITRATE 4% (200MG/5ML) IV SOLN: 5.0000 mL | Freq: Once | Status: DC | PRN

## 2024-04-25 MED ORDER — SODIUM CHLORIDE 0.9% FLUSH: 3.0000 mL | Freq: Once | INTRAVENOUS | Status: DC | PRN

## 2024-04-25 MED ORDER — FAMOTIDINE IN NACL 20-0.9 MG/50ML-% IV SOLN: 20.0000 mg | Freq: Once | INTRAVENOUS | Status: DC | PRN

## 2024-04-25 MED ORDER — ALBUTEROL SULFATE HFA 108 (90 BASE) MCG/ACT IN AERS: 2.0000 | INHALATION_SPRAY | Freq: Once | RESPIRATORY_TRACT | Status: DC | PRN

## 2024-04-25 MED ORDER — SODIUM CHLORIDE 0.9 % IV SOLN: Freq: Once | INTRAVENOUS | Status: DC | PRN

## 2024-04-25 MED ORDER — METHYLPREDNISOLONE SODIUM SUCC 125 MG IJ SOLR: 125.0000 mg | Freq: Once | INTRAMUSCULAR | Status: DC | PRN

## 2024-04-25 MED ORDER — HEPARIN SOD (PORK) LOCK FLUSH 100 UNIT/ML IV SOLN: 500.0000 [IU] | Freq: Once | INTRAVENOUS | Status: DC | PRN

## 2024-04-25 MED ORDER — ALTEPLASE 2 MG IJ SOLR: 2.0000 mg | Freq: Once | INTRAMUSCULAR | Status: DC | PRN

## 2024-04-25 MED ORDER — EMPTY CONTAINERS FLEXIBLE MISC
5130.0000 mg | Freq: Once | Status: AC
  Administered 2024-04-25: 5130 mg via INTRAVENOUS
  Filled 2024-04-25: qty 5130

## 2024-04-25 MED ORDER — EPINEPHRINE 0.3 MG/0.3ML IJ SOAJ: 0.3000 mg | Freq: Once | INTRAMUSCULAR | Status: DC | PRN

## 2024-04-25 MED ORDER — HEPARIN SOD (PORK) LOCK FLUSH 100 UNIT/ML IV SOLN: 250.0000 [IU] | Freq: Once | INTRAVENOUS | Status: DC | PRN

## 2024-04-25 MED ORDER — SODIUM CHLORIDE 0.9% FLUSH: 10.0000 mL | Freq: Once | INTRAVENOUS | Status: DC | PRN

## 2024-05-02 ENCOUNTER — Ambulatory Visit
Admission: RE | Admit: 2024-05-02 | Discharge: 2024-05-02 | Disposition: A | Discharge status: Home / Self Care | Source: Ambulatory Visit | Attending: Student in an Organized Health Care Education/Training Program | Admitting: Student in an Organized Health Care Education/Training Program

## 2024-05-02 VITALS — BP 106/72 | HR 61 | Temp 97.0°F | Resp 16 | Ht 76.0 in | Wt 201.9 lb

## 2024-05-02 DIAGNOSIS — E8801 Alpha-1-antitrypsin deficiency: Secondary | ICD-10-CM | POA: Diagnosis present

## 2024-05-02 MED ORDER — EMPTY CONTAINERS FLEXIBLE MISC
4992.0000 mg | Freq: Once | Status: AC
  Administered 2024-05-02: 4992 mg via INTRAVENOUS
  Filled 2024-05-02: qty 4992

## 2024-05-09 ENCOUNTER — Ambulatory Visit
Admission: RE | Admit: 2024-05-09 | Discharge: 2024-05-09 | Disposition: A | Discharge status: Home / Self Care | Source: Ambulatory Visit | Attending: Student in an Organized Health Care Education/Training Program | Admitting: Student in an Organized Health Care Education/Training Program

## 2024-05-09 VITALS — BP 100/67 | HR 76 | Temp 97.7°F | Resp 18 | Wt 202.0 lb

## 2024-05-09 DIAGNOSIS — E8801 Alpha-1-antitrypsin deficiency: Secondary | ICD-10-CM | POA: Insufficient documentation

## 2024-05-09 MED ORDER — SODIUM CHLORIDE 0.9 % IV SOLN: Freq: Once | INTRAVENOUS | Status: DC | PRN

## 2024-05-09 MED ORDER — SODIUM CHLORIDE 0.9% FLUSH: 10.0000 mL | Freq: Once | INTRAVENOUS | Status: DC | PRN

## 2024-05-09 MED ORDER — HEPARIN SOD (PORK) LOCK FLUSH 100 UNIT/ML IV SOLN: 500.0000 [IU] | Freq: Once | INTRAVENOUS | Status: DC | PRN

## 2024-05-09 MED ORDER — ALBUTEROL SULFATE HFA 108 (90 BASE) MCG/ACT IN AERS
2.0000 | INHALATION_SPRAY | Freq: Once | RESPIRATORY_TRACT | Status: DC | PRN
  Filled 2024-05-09: qty 6.7

## 2024-05-09 MED ORDER — METHYLPREDNISOLONE SODIUM SUCC 125 MG IJ SOLR: 125.0000 mg | Freq: Once | INTRAMUSCULAR | Status: DC | PRN

## 2024-05-09 MED ORDER — ANTICOAGULANT SODIUM CITRATE 4% (200MG/5ML) IV SOLN
5.0000 mL | Freq: Once | Status: DC | PRN
  Filled 2024-05-09: qty 5

## 2024-05-09 MED ORDER — ALTEPLASE 2 MG IJ SOLR
2.0000 mg | Freq: Once | INTRAMUSCULAR | Status: DC | PRN
  Filled 2024-05-09: qty 2

## 2024-05-09 MED ORDER — SODIUM CHLORIDE 0.9% FLUSH: 3.0000 mL | Freq: Once | INTRAVENOUS | Status: DC | PRN

## 2024-05-09 MED ORDER — DIPHENHYDRAMINE HCL 50 MG/ML IJ SOLN: 50.0000 mg | Freq: Once | INTRAMUSCULAR | Status: DC | PRN

## 2024-05-09 MED ORDER — FAMOTIDINE IN NACL 20-0.9 MG/50ML-% IV SOLN
20.0000 mg | Freq: Once | INTRAVENOUS | Status: DC | PRN
  Filled 2024-05-09: qty 50

## 2024-05-09 MED ORDER — EPINEPHRINE 0.3 MG/0.3ML IJ SOAJ
0.3000 mg | Freq: Once | INTRAMUSCULAR | Status: DC | PRN
  Filled 2024-05-09: qty 0.3

## 2024-05-09 MED ORDER — HEPARIN SOD (PORK) LOCK FLUSH 100 UNIT/ML IV SOLN: 250.0000 [IU] | Freq: Once | INTRAVENOUS | Status: DC | PRN

## 2024-05-09 MED ORDER — EMPTY CONTAINERS FLEXIBLE MISC
5130.0000 mg | Freq: Once | Status: AC
  Administered 2024-05-09: 5130 mg via INTRAVENOUS
  Filled 2024-05-09: qty 5130

## 2024-05-16 ENCOUNTER — Ambulatory Visit
Admission: RE | Admit: 2024-05-16 | Discharge: 2024-05-16 | Disposition: A | Discharge status: Home / Self Care | Source: Ambulatory Visit | Attending: Student in an Organized Health Care Education/Training Program | Admitting: Student in an Organized Health Care Education/Training Program

## 2024-05-16 VITALS — BP 92/61 | HR 80 | Temp 97.3°F | Resp 17 | Ht 76.0 in | Wt 201.5 lb

## 2024-05-16 DIAGNOSIS — E8801 Alpha-1-antitrypsin deficiency: Secondary | ICD-10-CM | POA: Insufficient documentation

## 2024-05-16 MED ORDER — HEPARIN SOD (PORK) LOCK FLUSH 100 UNIT/ML IV SOLN: 500.0000 [IU] | Freq: Once | INTRAVENOUS | Status: DC | PRN

## 2024-05-16 MED ORDER — DIPHENHYDRAMINE HCL 50 MG/ML IJ SOLN: 50.0000 mg | Freq: Once | INTRAMUSCULAR | Status: DC | PRN

## 2024-05-16 MED ORDER — SODIUM CHLORIDE 0.9 % IV SOLN: Freq: Once | INTRAVENOUS | Status: DC | PRN

## 2024-05-16 MED ORDER — SODIUM CHLORIDE 0.9% FLUSH: 3.0000 mL | Freq: Once | INTRAVENOUS | Status: DC | PRN

## 2024-05-16 MED ORDER — ALBUTEROL SULFATE HFA 108 (90 BASE) MCG/ACT IN AERS: 2.0000 | INHALATION_SPRAY | Freq: Once | RESPIRATORY_TRACT | Status: DC | PRN

## 2024-05-16 MED ORDER — METHYLPREDNISOLONE SODIUM SUCC 125 MG IJ SOLR: 125.0000 mg | Freq: Once | INTRAMUSCULAR | Status: DC | PRN

## 2024-05-16 MED ORDER — SODIUM CHLORIDE 0.9% FLUSH: 10.0000 mL | Freq: Once | INTRAVENOUS | Status: DC | PRN

## 2024-05-16 MED ORDER — EMPTY CONTAINERS FLEXIBLE MISC
5125.0000 mg | Freq: Once | Status: AC
  Administered 2024-05-16: 5125 mg via INTRAVENOUS
  Filled 2024-05-16: qty 5125

## 2024-05-16 MED ORDER — ANTICOAGULANT SODIUM CITRATE 4% (200MG/5ML) IV SOLN: 5.0000 mL | Freq: Once | Status: DC | PRN

## 2024-05-16 MED ORDER — EPINEPHRINE 0.3 MG/0.3ML IJ SOAJ: 0.3000 mg | Freq: Once | INTRAMUSCULAR | Status: DC | PRN

## 2024-05-16 MED ORDER — ALTEPLASE 2 MG IJ SOLR: 2.0000 mg | Freq: Once | INTRAMUSCULAR | Status: DC | PRN

## 2024-05-16 MED ORDER — FAMOTIDINE IN NACL 20-0.9 MG/50ML-% IV SOLN: 20.0000 mg | Freq: Once | INTRAVENOUS | Status: DC | PRN

## 2024-05-16 MED ORDER — HEPARIN SOD (PORK) LOCK FLUSH 100 UNIT/ML IV SOLN: 250.0000 [IU] | Freq: Once | INTRAVENOUS | Status: DC | PRN

## 2024-05-23 ENCOUNTER — Ambulatory Visit
Admission: RE | Admit: 2024-05-23 | Discharge: 2024-05-23 | Disposition: A | Discharge status: Home / Self Care | Source: Ambulatory Visit | Attending: Student in an Organized Health Care Education/Training Program | Admitting: Student in an Organized Health Care Education/Training Program

## 2024-05-23 VITALS — BP 96/67 | HR 69 | Temp 97.7°F | Resp 16 | Ht 76.0 in | Wt 202.0 lb

## 2024-05-23 DIAGNOSIS — E8801 Alpha-1-antitrypsin deficiency: Secondary | ICD-10-CM | POA: Diagnosis present

## 2024-05-23 MED ORDER — ANTICOAGULANT SODIUM CITRATE 4% (200MG/5ML) IV SOLN: 5.0000 mL | Freq: Once | Status: DC | PRN

## 2024-05-23 MED ORDER — METHYLPREDNISOLONE SODIUM SUCC 125 MG IJ SOLR: 125.0000 mg | Freq: Once | INTRAMUSCULAR | Status: DC | PRN

## 2024-05-23 MED ORDER — ALTEPLASE 2 MG IJ SOLR: 2.0000 mg | Freq: Once | INTRAMUSCULAR | Status: DC | PRN

## 2024-05-23 MED ORDER — DIPHENHYDRAMINE HCL 50 MG/ML IJ SOLN: 50.0000 mg | Freq: Once | INTRAMUSCULAR | Status: DC | PRN

## 2024-05-23 MED ORDER — EMPTY CONTAINERS FLEXIBLE MISC
5126.0000 mg | Freq: Once | Status: AC
  Administered 2024-05-23: 5126 mg via INTRAVENOUS
  Filled 2024-05-23: qty 5126

## 2024-05-23 MED ORDER — HEPARIN SOD (PORK) LOCK FLUSH 100 UNIT/ML IV SOLN: 250.0000 [IU] | Freq: Once | INTRAVENOUS | Status: DC | PRN

## 2024-05-23 MED ORDER — SODIUM CHLORIDE 0.9 % IV SOLN: Freq: Once | INTRAVENOUS | Status: DC | PRN

## 2024-05-23 MED ORDER — SODIUM CHLORIDE 0.9% FLUSH: 10.0000 mL | Freq: Once | INTRAVENOUS | Status: DC | PRN

## 2024-05-23 MED ORDER — ALBUTEROL SULFATE HFA 108 (90 BASE) MCG/ACT IN AERS
2.0000 | INHALATION_SPRAY | Freq: Once | RESPIRATORY_TRACT | Status: DC | PRN
  Filled 2024-05-23: qty 6.7

## 2024-05-23 MED ORDER — EPINEPHRINE 0.3 MG/0.3ML IJ SOAJ: 0.3000 mg | Freq: Once | INTRAMUSCULAR | Status: DC | PRN

## 2024-05-23 MED ORDER — HEPARIN SOD (PORK) LOCK FLUSH 100 UNIT/ML IV SOLN: 500.0000 [IU] | Freq: Once | INTRAVENOUS | Status: DC | PRN

## 2024-05-23 MED ORDER — SODIUM CHLORIDE 0.9% FLUSH: 3.0000 mL | Freq: Once | INTRAVENOUS | Status: DC | PRN

## 2024-05-23 MED ORDER — FAMOTIDINE IN NACL 20-0.9 MG/50ML-% IV SOLN: 20.0000 mg | Freq: Once | INTRAVENOUS | Status: DC | PRN

## 2024-05-30 ENCOUNTER — Other Ambulatory Visit (HOSPITAL_COMMUNITY): Payer: Self-pay | Admitting: Pharmacy Technician

## 2024-05-30 ENCOUNTER — Ambulatory Visit
Admission: RE | Admit: 2024-05-30 | Discharge: 2024-05-30 | Disposition: A | Discharge status: Home / Self Care | Source: Ambulatory Visit | Attending: Student in an Organized Health Care Education/Training Program | Admitting: Student in an Organized Health Care Education/Training Program

## 2024-05-30 VITALS — BP 101/67 | HR 82 | Temp 97.9°F | Resp 18

## 2024-05-30 DIAGNOSIS — E8801 Alpha-1-antitrypsin deficiency: Secondary | ICD-10-CM | POA: Diagnosis present

## 2024-05-30 MED ORDER — EMPTY CONTAINERS FLEXIBLE MISC
5000.0000 mg | Freq: Once | Status: DC
  Filled 2024-05-30: qty 5000

## 2024-05-30 MED ORDER — ALPHA1-PROTEINASE INHIBITOR 1000 MG/20ML IV SOLN
5185.0000 mg | Freq: Once | INTRAVENOUS | Status: AC
  Administered 2024-05-30: 5185 mg via INTRAVENOUS
  Filled 2024-05-30: qty 6000
  Filled 2024-05-30: qty 5185

## 2024-06-06 ENCOUNTER — Ambulatory Visit
Admission: RE | Admit: 2024-06-06 | Discharge: 2024-06-06 | Disposition: A | Discharge status: Home / Self Care | Source: Ambulatory Visit | Attending: Student in an Organized Health Care Education/Training Program | Admitting: Student in an Organized Health Care Education/Training Program

## 2024-06-06 VITALS — BP 113/74 | HR 76 | Temp 97.6°F | Resp 16

## 2024-06-06 DIAGNOSIS — E8801 Alpha-1-antitrypsin deficiency: Secondary | ICD-10-CM | POA: Insufficient documentation

## 2024-06-06 MED ORDER — DIPHENHYDRAMINE HCL 50 MG/ML IJ SOLN: 50.0000 mg | Freq: Once | INTRAMUSCULAR | Status: DC | PRN

## 2024-06-06 MED ORDER — EMPTY CONTAINERS FLEXIBLE MISC
5185.0000 mg | Freq: Once | Status: AC
  Administered 2024-06-06: 5185 mg via INTRAVENOUS
  Filled 2024-06-06: qty 5185

## 2024-06-06 MED ORDER — FAMOTIDINE IN NACL 20-0.9 MG/50ML-% IV SOLN: 20.0000 mg | Freq: Once | INTRAVENOUS | Status: DC | PRN

## 2024-06-06 MED ORDER — SODIUM CHLORIDE 0.9 % IV SOLN: Freq: Once | INTRAVENOUS | Status: DC | PRN

## 2024-06-06 MED ORDER — HEPARIN SOD (PORK) LOCK FLUSH 100 UNIT/ML IV SOLN: 500.0000 [IU] | Freq: Once | INTRAVENOUS | Status: DC | PRN

## 2024-06-06 MED ORDER — HEPARIN SOD (PORK) LOCK FLUSH 100 UNIT/ML IV SOLN: 250.0000 [IU] | Freq: Once | INTRAVENOUS | Status: DC | PRN

## 2024-06-06 MED ORDER — ALTEPLASE 2 MG IJ SOLR: 2.0000 mg | Freq: Once | INTRAMUSCULAR | Status: DC | PRN

## 2024-06-06 MED ORDER — METHYLPREDNISOLONE SODIUM SUCC 125 MG IJ SOLR: 125.0000 mg | Freq: Once | INTRAMUSCULAR | Status: DC | PRN

## 2024-06-06 MED ORDER — EPINEPHRINE 0.3 MG/0.3ML IJ SOAJ: 0.3000 mg | Freq: Once | INTRAMUSCULAR | Status: DC | PRN

## 2024-06-06 MED ORDER — ALBUTEROL SULFATE HFA 108 (90 BASE) MCG/ACT IN AERS
2.0000 | INHALATION_SPRAY | Freq: Once | RESPIRATORY_TRACT | Status: DC | PRN
  Filled 2024-06-06: qty 6.7

## 2024-06-06 MED ORDER — ANTICOAGULANT SODIUM CITRATE 4% (200MG/5ML) IV SOLN: 5.0000 mL | Freq: Once | Status: DC | PRN

## 2024-06-06 MED ORDER — SODIUM CHLORIDE 0.9% FLUSH: 10.0000 mL | Freq: Once | INTRAVENOUS | Status: DC | PRN

## 2024-06-06 MED ORDER — SODIUM CHLORIDE 0.9% FLUSH: 3.0000 mL | Freq: Once | INTRAVENOUS | Status: DC | PRN

## 2024-06-13 ENCOUNTER — Ambulatory Visit
Admission: RE | Admit: 2024-06-13 | Discharge: 2024-06-13 | Disposition: A | Discharge status: Home / Self Care | Source: Ambulatory Visit | Attending: Student in an Organized Health Care Education/Training Program | Admitting: Student in an Organized Health Care Education/Training Program

## 2024-06-13 VITALS — BP 100/72 | HR 76 | Temp 97.8°F | Resp 16

## 2024-06-13 DIAGNOSIS — E8801 Alpha-1-antitrypsin deficiency: Secondary | ICD-10-CM | POA: Insufficient documentation

## 2024-06-13 MED ORDER — SODIUM CHLORIDE 0.9% FLUSH: 10.0000 mL | Freq: Once | INTRAVENOUS | Status: DC | PRN

## 2024-06-13 MED ORDER — HEPARIN SOD (PORK) LOCK FLUSH 100 UNIT/ML IV SOLN: 250.0000 [IU] | Freq: Once | INTRAVENOUS | Status: DC | PRN

## 2024-06-13 MED ORDER — SODIUM CHLORIDE 0.9 % IV SOLN: Freq: Once | INTRAVENOUS | Status: DC | PRN

## 2024-06-13 MED ORDER — DIPHENHYDRAMINE HCL 50 MG/ML IJ SOLN: 50.0000 mg | Freq: Once | INTRAMUSCULAR | Status: DC | PRN

## 2024-06-13 MED ORDER — SODIUM CHLORIDE 0.9% FLUSH: 3.0000 mL | Freq: Once | INTRAVENOUS | Status: DC | PRN

## 2024-06-13 MED ORDER — ALTEPLASE 2 MG IJ SOLR: 2.0000 mg | Freq: Once | INTRAMUSCULAR | Status: DC | PRN

## 2024-06-13 MED ORDER — EPINEPHRINE 0.3 MG/0.3ML IJ SOAJ: 0.3000 mg | Freq: Once | INTRAMUSCULAR | Status: DC | PRN

## 2024-06-13 MED ORDER — METHYLPREDNISOLONE SODIUM SUCC 125 MG IJ SOLR: 125.0000 mg | Freq: Once | INTRAMUSCULAR | Status: DC | PRN

## 2024-06-13 MED ORDER — HEPARIN SOD (PORK) LOCK FLUSH 100 UNIT/ML IV SOLN: 500.0000 [IU] | Freq: Once | INTRAVENOUS | Status: DC | PRN

## 2024-06-13 MED ORDER — ANTICOAGULANT SODIUM CITRATE 4% (200MG/5ML) IV SOLN: 5.0000 mL | Freq: Once | Status: DC | PRN

## 2024-06-13 MED ORDER — ALBUTEROL SULFATE HFA 108 (90 BASE) MCG/ACT IN AERS: 2.0000 | INHALATION_SPRAY | Freq: Once | RESPIRATORY_TRACT | Status: DC | PRN

## 2024-06-13 MED ORDER — EMPTY CONTAINERS FLEXIBLE MISC
5194.0000 mg | Freq: Once | Status: AC
  Administered 2024-06-13: 5194 mg via INTRAVENOUS
  Filled 2024-06-13: qty 5194

## 2024-06-13 MED ORDER — FAMOTIDINE IN NACL 20-0.9 MG/50ML-% IV SOLN: 20.0000 mg | Freq: Once | INTRAVENOUS | Status: DC | PRN

## 2024-06-20 ENCOUNTER — Ambulatory Visit
Admission: RE | Admit: 2024-06-20 | Discharge: 2024-06-20 | Disposition: A | Discharge status: Home / Self Care | Source: Ambulatory Visit | Attending: Student in an Organized Health Care Education/Training Program | Admitting: Student in an Organized Health Care Education/Training Program

## 2024-06-20 VITALS — BP 100/65 | HR 76 | Temp 98.1°F | Resp 16

## 2024-06-20 DIAGNOSIS — E8801 Alpha-1-antitrypsin deficiency: Secondary | ICD-10-CM

## 2024-06-20 MED ORDER — EMPTY CONTAINERS FLEXIBLE MISC
5000.0000 mg | Freq: Once | Status: AC
  Administered 2024-06-20: 5000 mg via INTRAVENOUS
  Filled 2024-06-20: qty 5000

## 2024-06-27 ENCOUNTER — Ambulatory Visit

## 2024-07-04 ENCOUNTER — Ambulatory Visit
Admission: RE | Admit: 2024-07-04 | Discharge: 2024-07-04 | Disposition: A | Source: Ambulatory Visit | Attending: Student in an Organized Health Care Education/Training Program | Admitting: Student in an Organized Health Care Education/Training Program

## 2024-07-04 VITALS — BP 106/73 | HR 62 | Temp 98.2°F | Resp 15

## 2024-07-04 DIAGNOSIS — E8801 Alpha-1-antitrypsin deficiency: Secondary | ICD-10-CM | POA: Diagnosis present

## 2024-07-04 MED ORDER — EMPTY CONTAINERS FLEXIBLE MISC
5015.0000 mg | Freq: Once | Status: AC
  Administered 2024-07-04: 5015 mg via INTRAVENOUS
  Filled 2024-07-04: qty 5015

## 2024-07-11 ENCOUNTER — Ambulatory Visit
Admission: RE | Admit: 2024-07-11 | Discharge: 2024-07-11 | Disposition: A | Source: Ambulatory Visit | Attending: Student in an Organized Health Care Education/Training Program | Admitting: Student in an Organized Health Care Education/Training Program

## 2024-07-11 VITALS — BP 112/76 | HR 63 | Temp 97.4°F | Resp 15

## 2024-07-11 DIAGNOSIS — E8801 Alpha-1-antitrypsin deficiency: Secondary | ICD-10-CM | POA: Diagnosis not present

## 2024-07-11 MED ORDER — EMPTY CONTAINERS FLEXIBLE MISC
5195.0000 mg | Freq: Once | Status: AC
  Administered 2024-07-11: 10:00:00 5195 mg via INTRAVENOUS
  Filled 2024-07-11: qty 5195

## 2024-07-18 ENCOUNTER — Ambulatory Visit

## 2024-07-18 VITALS — BP 104/71 | HR 71 | Temp 97.7°F | Resp 20

## 2024-07-18 DIAGNOSIS — E8801 Alpha-1-antitrypsin deficiency: Secondary | ICD-10-CM | POA: Diagnosis not present

## 2024-07-18 MED ORDER — ALBUTEROL SULFATE HFA 108 (90 BASE) MCG/ACT IN AERS
2.0000 | INHALATION_SPRAY | Freq: Once | RESPIRATORY_TRACT | Status: DC | PRN
  Filled 2024-07-18: qty 6.7

## 2024-07-18 MED ORDER — ALTEPLASE 2 MG IJ SOLR
2.0000 mg | Freq: Once | INTRAMUSCULAR | Status: DC | PRN
  Filled 2024-07-18: qty 2

## 2024-07-18 MED ORDER — EMPTY CONTAINERS FLEXIBLE MISC
5051.0000 mg | Freq: Once | Status: AC
  Administered 2024-07-18: 5051 mg via INTRAVENOUS
  Filled 2024-07-18: qty 5051

## 2024-07-18 MED ORDER — METHYLPREDNISOLONE SODIUM SUCC 125 MG IJ SOLR: 125.0000 mg | Freq: Once | INTRAMUSCULAR | Status: DC | PRN

## 2024-07-18 MED ORDER — HEPARIN SOD (PORK) LOCK FLUSH 100 UNIT/ML IV SOLN: 500.0000 [IU] | Freq: Once | INTRAVENOUS | Status: DC | PRN

## 2024-07-18 MED ORDER — SODIUM CHLORIDE 0.9% FLUSH: 3.0000 mL | Freq: Once | INTRAVENOUS | Status: DC | PRN

## 2024-07-18 MED ORDER — FAMOTIDINE IN NACL 20-0.9 MG/50ML-% IV SOLN
20.0000 mg | Freq: Once | INTRAVENOUS | Status: DC | PRN
  Filled 2024-07-18: qty 50

## 2024-07-18 MED ORDER — SODIUM CHLORIDE 0.9% FLUSH: 10.0000 mL | Freq: Once | INTRAVENOUS | Status: DC | PRN

## 2024-07-18 MED ORDER — ANTICOAGULANT SODIUM CITRATE 4% (200MG/5ML) IV SOLN
5.0000 mL | Freq: Once | Status: DC | PRN
  Filled 2024-07-18: qty 5

## 2024-07-18 MED ORDER — EPINEPHRINE 0.3 MG/0.3ML IJ SOAJ
0.3000 mg | Freq: Once | INTRAMUSCULAR | Status: DC | PRN
  Filled 2024-07-18: qty 0.3

## 2024-07-18 MED ORDER — HEPARIN SOD (PORK) LOCK FLUSH 100 UNIT/ML IV SOLN: 250.0000 [IU] | Freq: Once | INTRAVENOUS | Status: DC | PRN

## 2024-07-18 MED ORDER — DIPHENHYDRAMINE HCL 50 MG/ML IJ SOLN: 50.0000 mg | Freq: Once | INTRAMUSCULAR | Status: DC | PRN

## 2024-07-18 MED ORDER — SODIUM CHLORIDE 0.9 % IV SOLN: Freq: Once | INTRAVENOUS | Status: DC | PRN

## 2024-07-25 ENCOUNTER — Ambulatory Visit
Admission: RE | Admit: 2024-07-25 | Discharge: 2024-07-25 | Disposition: A | Source: Ambulatory Visit | Attending: Student in an Organized Health Care Education/Training Program | Admitting: Student in an Organized Health Care Education/Training Program

## 2024-07-25 VITALS — BP 90/62 | HR 79 | Temp 98.0°F | Resp 17

## 2024-07-25 DIAGNOSIS — E8801 Alpha-1-antitrypsin deficiency: Secondary | ICD-10-CM | POA: Diagnosis not present

## 2024-07-25 MED ORDER — EMPTY CONTAINERS FLEXIBLE MISC
5159.0000 mg | Freq: Once | Status: AC
  Administered 2024-07-25: 5159 mg via INTRAVENOUS
  Filled 2024-07-25: qty 5159

## 2024-08-01 ENCOUNTER — Telehealth (HOSPITAL_COMMUNITY): Payer: Self-pay

## 2024-08-01 ENCOUNTER — Ambulatory Visit
Admission: RE | Admit: 2024-08-01 | Discharge: 2024-08-01 | Disposition: A | Discharge status: Home / Self Care | Source: Ambulatory Visit | Attending: Student in an Organized Health Care Education/Training Program | Admitting: Student in an Organized Health Care Education/Training Program

## 2024-08-01 VITALS — BP 101/73 | HR 61 | Temp 97.2°F | Resp 17 | Ht 76.0 in | Wt 201.0 lb

## 2024-08-01 DIAGNOSIS — E8801 Alpha-1-antitrypsin deficiency: Secondary | ICD-10-CM | POA: Diagnosis present

## 2024-08-01 MED ORDER — EMPTY CONTAINERS FLEXIBLE MISC
5053.0000 mg | Freq: Once | Status: AC
  Administered 2024-08-01: 5053 mg via INTRAVENOUS
  Filled 2024-08-01: qty 5053

## 2024-08-01 NOTE — Telephone Encounter (Signed)
 Auth Submission: NO AUTH NEEDED Site of care: Site of care: [facility] Payer: Medicare A/B, BCBS Medication & CPT/J Code(s) submitted: Prolastin ([id number]) Diagnosis Code: E88.01 Route of submission (phone, fax, portal):  Phone # Fax # Auth type: Buy/Bill HB Units/visits requested: 5000mg  every week Reference number:  Approval from: 07/28/24 to 08/27/25

## 2024-08-08 ENCOUNTER — Ambulatory Visit
Admission: RE | Admit: 2024-08-08 | Discharge: 2024-08-08 | Disposition: A | Source: Ambulatory Visit | Attending: Student in an Organized Health Care Education/Training Program | Admitting: Student in an Organized Health Care Education/Training Program

## 2024-08-08 VITALS — BP 95/64 | HR 61 | Temp 98.0°F | Resp 17

## 2024-08-08 DIAGNOSIS — E8801 Alpha-1-antitrypsin deficiency: Secondary | ICD-10-CM

## 2024-08-08 MED ORDER — ALPHA1-PROTEINASE INHIBITOR 1000 MG/20ML IV SOLN
5110.0000 mg | Freq: Once | INTRAVENOUS | Status: AC
  Administered 2024-08-08: 5110 mg via INTRAVENOUS
  Filled 2024-08-08: qty 6000

## 2024-08-08 MED ORDER — EMPTY CONTAINERS FLEXIBLE MISC
5000.0000 mg | Freq: Once | Status: DC
  Filled 2024-08-08: qty 5000

## 2024-08-15 ENCOUNTER — Ambulatory Visit
Admission: RE | Admit: 2024-08-15 | Discharge: 2024-08-15 | Disposition: A | Source: Ambulatory Visit | Attending: Student in an Organized Health Care Education/Training Program | Admitting: Student in an Organized Health Care Education/Training Program

## 2024-08-15 VITALS — BP 103/69 | HR 78 | Temp 96.9°F | Resp 15

## 2024-08-15 DIAGNOSIS — E8801 Alpha-1-antitrypsin deficiency: Secondary | ICD-10-CM

## 2024-08-15 MED ORDER — EMPTY CONTAINERS FLEXIBLE MISC
5110.0000 mg | Freq: Once | Status: AC
  Administered 2024-08-15: 5110 mg via INTRAVENOUS
  Filled 2024-08-15: qty 5110

## 2024-08-16 ENCOUNTER — Telehealth (HOSPITAL_COMMUNITY): Payer: Self-pay | Admitting: Pharmacist

## 2024-08-16 NOTE — Telephone Encounter (Signed)
 Faxed [facility] for Prolastin order renewal with note that orders expire on 08/25/2024  Fax: [phone] Phone: [phone]  Sherry Pennant, PharmD, MPH, BCPS, CPP Clinical Pharmacist

## 2024-08-22 ENCOUNTER — Ambulatory Visit
Admission: RE | Admit: 2024-08-22 | Source: Ambulatory Visit | Attending: Student in an Organized Health Care Education/Training Program | Admitting: Student in an Organized Health Care Education/Training Program

## 2024-08-23 ENCOUNTER — Ambulatory Visit
Admission: RE | Admit: 2024-08-23 | Discharge: 2024-08-23 | Attending: Student in an Organized Health Care Education/Training Program

## 2024-08-23 VITALS — BP 97/57 | HR 74 | Temp 97.8°F | Resp 16 | Ht 76.0 in | Wt 203.0 lb

## 2024-08-23 DIAGNOSIS — E8801 Alpha-1-antitrypsin deficiency: Secondary | ICD-10-CM | POA: Diagnosis not present

## 2024-08-23 MED ORDER — EMPTY CONTAINERS FLEXIBLE MISC
5110.0000 mg | Freq: Once | Status: AC
  Administered 2024-08-23: 5110 mg via INTRAVENOUS
  Filled 2024-08-23: qty 5110

## 2024-08-26 NOTE — Progress Notes (Signed)
 Rx Benefits check.

## 2024-08-26 NOTE — Progress Notes (Signed)
 Prescription for alpha-1 proteinase (Prolastin-C ) needed to be reordered due to specialty pharmacy not receiving prescription.

## 2024-08-26 NOTE — Telephone Encounter (Signed)
 Called MD for updated Prolastin order renewal. Per triage, they sent Prolastin to [facility]. Left VM requesting fax of orders ASAP  Phone: [phone]  Sherry Pennant, PharmD, MPH, BCPS, CPP Clinical Pharmacist

## 2024-08-29 ENCOUNTER — Ambulatory Visit
Admission: RE | Admit: 2024-08-29 | Discharge: 2024-08-29 | Disposition: A | Source: Ambulatory Visit | Attending: Student in an Organized Health Care Education/Training Program

## 2024-08-29 ENCOUNTER — Encounter: Payer: Self-pay | Admitting: Pulmonary Disease

## 2024-08-29 VITALS — BP 101/63 | HR 73 | Temp 98.1°F | Resp 16

## 2024-08-29 DIAGNOSIS — E8801 Alpha-1-antitrypsin deficiency: Secondary | ICD-10-CM

## 2024-08-29 MED ORDER — EMPTY CONTAINERS FLEXIBLE MISC
5000.0000 mg | Freq: Once | Status: AC
  Administered 2024-08-29: 5000 mg via INTRAVENOUS
  Filled 2024-08-29: qty 5000

## 2024-08-30 ENCOUNTER — Encounter: Payer: Self-pay | Admitting: Pulmonary Disease

## 2024-09-05 ENCOUNTER — Ambulatory Visit

## 2024-09-12 ENCOUNTER — Ambulatory Visit

## 2024-09-19 ENCOUNTER — Ambulatory Visit

## 2024-09-26 ENCOUNTER — Ambulatory Visit

## 2024-10-03 ENCOUNTER — Ambulatory Visit

## 2024-10-10 ENCOUNTER — Ambulatory Visit

## 2024-10-17 ENCOUNTER — Ambulatory Visit

## 2024-10-24 ENCOUNTER — Ambulatory Visit

## 2024-10-31 ENCOUNTER — Ambulatory Visit

## 2024-11-07 ENCOUNTER — Ambulatory Visit

## 2024-11-14 ENCOUNTER — Ambulatory Visit

## 2024-11-21 ENCOUNTER — Ambulatory Visit

## 2024-11-28 ENCOUNTER — Ambulatory Visit

## 2024-12-05 ENCOUNTER — Ambulatory Visit

## 2024-12-12 ENCOUNTER — Ambulatory Visit

## 2024-12-20 ENCOUNTER — Ambulatory Visit

## 2024-12-26 ENCOUNTER — Ambulatory Visit

## 2025-01-02 ENCOUNTER — Ambulatory Visit

## 2025-01-09 ENCOUNTER — Ambulatory Visit

## 2025-01-16 ENCOUNTER — Ambulatory Visit

## 2025-01-23 ENCOUNTER — Ambulatory Visit

## 2025-01-30 ENCOUNTER — Ambulatory Visit

## 2025-02-06 ENCOUNTER — Ambulatory Visit

## 2025-02-13 ENCOUNTER — Ambulatory Visit

## 2025-02-20 ENCOUNTER — Ambulatory Visit

## 2025-02-27 ENCOUNTER — Ambulatory Visit

## 2025-03-06 ENCOUNTER — Ambulatory Visit

## 2025-03-13 ENCOUNTER — Ambulatory Visit

## 2025-03-20 ENCOUNTER — Ambulatory Visit

## 2025-03-27 ENCOUNTER — Ambulatory Visit

## 2025-04-04 ENCOUNTER — Ambulatory Visit

## 2025-04-10 ENCOUNTER — Ambulatory Visit

## 2025-04-17 ENCOUNTER — Ambulatory Visit

## 2025-04-24 ENCOUNTER — Ambulatory Visit

## 2025-05-01 ENCOUNTER — Ambulatory Visit

## 2025-05-08 ENCOUNTER — Ambulatory Visit

## 2025-05-15 ENCOUNTER — Ambulatory Visit

## 2025-05-22 ENCOUNTER — Ambulatory Visit

## 2025-05-29 ENCOUNTER — Ambulatory Visit

## 2025-06-05 ENCOUNTER — Ambulatory Visit

## 2025-06-12 ENCOUNTER — Ambulatory Visit

## 2025-06-19 ENCOUNTER — Ambulatory Visit

## 2025-06-26 ENCOUNTER — Ambulatory Visit

## 2025-07-03 ENCOUNTER — Ambulatory Visit

## 2025-07-10 ENCOUNTER — Ambulatory Visit

## 2025-07-17 ENCOUNTER — Ambulatory Visit

## 2025-07-24 ENCOUNTER — Ambulatory Visit
